# Patient Record
Sex: Female | Born: 1955 | Race: White | Hispanic: No | Smoking: Never smoker
Health system: Southern US, Community
[De-identification: ages and names within clinical notes are randomized; demographics above are authoritative.]

## PROBLEM LIST (undated history)

## (undated) DIAGNOSIS — E041 Nontoxic single thyroid nodule: Secondary | ICD-10-CM

## (undated) DIAGNOSIS — M1990 Unspecified osteoarthritis, unspecified site: Secondary | ICD-10-CM

## (undated) DIAGNOSIS — R222 Localized swelling, mass and lump, trunk: Secondary | ICD-10-CM

## (undated) DIAGNOSIS — T7840XA Allergy, unspecified, initial encounter: Secondary | ICD-10-CM

## (undated) DIAGNOSIS — L4050 Arthropathic psoriasis, unspecified: Secondary | ICD-10-CM

## (undated) DIAGNOSIS — J302 Other seasonal allergic rhinitis: Secondary | ICD-10-CM

## (undated) HISTORY — DX: Allergy, unspecified, initial encounter: T78.40XA

## (undated) HISTORY — PX: HIP ARTHROPLASTY: SHX981

## (undated) HISTORY — PX: REDUCTION MAMMAPLASTY: SUR839

## (undated) HISTORY — PX: EYE SURGERY: SHX253

---
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
## 1995-10-03 HISTORY — PX: CHOLECYSTECTOMY: SHX55

## 1995-10-03 HISTORY — PX: ABDOMINAL HYSTERECTOMY: SHX81

## 1998-01-29 ENCOUNTER — Other Ambulatory Visit: Admission: RE | Admit: 1998-01-29 | Discharge: 1998-01-29 | Payer: Self-pay | Admitting: Sports Medicine

## 1998-02-03 ENCOUNTER — Ambulatory Visit (HOSPITAL_COMMUNITY): Admission: RE | Admit: 1998-02-03 | Discharge: 1998-02-03 | Payer: Self-pay | Admitting: Sports Medicine

## 2000-04-25 ENCOUNTER — Other Ambulatory Visit: Admission: RE | Admit: 2000-04-25 | Discharge: 2000-04-25 | Payer: Self-pay | Admitting: Obstetrics and Gynecology

## 2002-08-21 ENCOUNTER — Other Ambulatory Visit: Admission: RE | Admit: 2002-08-21 | Discharge: 2002-08-21 | Payer: Self-pay | Admitting: Obstetrics & Gynecology

## 2002-08-25 ENCOUNTER — Encounter: Payer: Self-pay | Admitting: Obstetrics and Gynecology

## 2002-08-25 ENCOUNTER — Encounter: Admission: RE | Admit: 2002-08-25 | Discharge: 2002-08-25 | Payer: Self-pay | Admitting: Obstetrics and Gynecology

## 2003-10-03 HISTORY — PX: CERVICAL FUSION: SHX112

## 2004-06-09 ENCOUNTER — Ambulatory Visit (HOSPITAL_COMMUNITY): Admission: RE | Admit: 2004-06-09 | Discharge: 2004-06-10 | Payer: Self-pay | Admitting: Neurological Surgery

## 2004-07-11 ENCOUNTER — Encounter: Admission: RE | Admit: 2004-07-11 | Discharge: 2004-07-11 | Payer: Self-pay | Admitting: Neurosurgery

## 2004-07-18 ENCOUNTER — Other Ambulatory Visit: Admission: RE | Admit: 2004-07-18 | Discharge: 2004-07-18 | Payer: Self-pay | Admitting: Obstetrics and Gynecology

## 2012-03-02 HISTORY — PX: SHOULDER SURGERY: SHX246

## 2012-03-02 HISTORY — PX: SHOULDER ARTHROSCOPY W/ SUBACROMIAL DECOMPRESSION AND DISTAL CLAVICLE EXCISION: SHX2401

## 2012-07-22 ENCOUNTER — Other Ambulatory Visit: Payer: Self-pay | Admitting: Neurological Surgery

## 2012-07-24 ENCOUNTER — Encounter (HOSPITAL_COMMUNITY): Payer: Self-pay | Admitting: Pharmacy Technician

## 2012-08-02 ENCOUNTER — Encounter (HOSPITAL_COMMUNITY): Payer: Self-pay

## 2012-08-02 ENCOUNTER — Encounter (HOSPITAL_COMMUNITY)
Admission: RE | Admit: 2012-08-02 | Discharge: 2012-08-02 | Disposition: A | Discharge status: Home / Self Care | Payer: BC Managed Care – PPO | Source: Ambulatory Visit | Attending: Neurological Surgery | Admitting: Neurological Surgery

## 2012-08-02 HISTORY — DX: Unspecified osteoarthritis, unspecified site: M19.90

## 2012-08-02 LAB — CBC WITH DIFFERENTIAL/PLATELET
Basophils Absolute: 0.1 10*3/uL (ref 0.0–0.1)
Basophils Relative: 1 % (ref 0–1)
Hemoglobin: 13.7 g/dL (ref 12.0–15.0)
MCHC: 33.8 g/dL (ref 30.0–36.0)
Monocytes Relative: 7 % (ref 3–12)
Neutro Abs: 4.6 10*3/uL (ref 1.7–7.7)
Neutrophils Relative %: 57 % (ref 43–77)
RDW: 12.9 % (ref 11.5–15.5)
WBC: 8 10*3/uL (ref 4.0–10.5)

## 2012-08-02 LAB — PROTIME-INR: Prothrombin Time: 12 seconds (ref 11.6–15.2)

## 2012-08-02 LAB — BASIC METABOLIC PANEL
Chloride: 103 mEq/L (ref 96–112)
GFR calc Af Amer: >90 mL/min (ref >90)
Potassium: 4.3 mEq/L (ref 3.5–5.1)

## 2012-08-02 LAB — SURGICAL PCR SCREEN
MRSA, PCR: NEGATIVE
Staphylococcus aureus: POSITIVE — AB

## 2012-08-02 NOTE — Pre-Procedure Instructions (Signed)
20 Gabrielle Duncan  08/02/2012   Your procedure is scheduled on: Thursday, November 7th.  Report to [facility] at 9:15 AM.  Call this number if you have problems the morning of surgery: [phone]   Remember:   Do not eat food or drink any liquid:After Midnight.     Take these medicines the morning of surgery with A SIP OF WATER: Claritin if needed.   Do not wear jewelry, make-up or nail polish.  Do not wear lotions, powders, or perfumes. You may wear deodorant.  Do not shave 48 hours prior to surgery. Men may shave face and neck.  Do not bring valuables to the hospital.  Contacts, dentures or bridgework may not be worn into surgery.  Leave suitcase in the car. After surgery it may be brought to your room.  For patients admitted to the hospital, checkout time is 11:00 AM the day of discharge.   Patients discharged the day of surgery will not be allowed to drive home.  Name and phone number of your driver: NA  Special Instructions: Shower using CHG 2 nights before surgery and the night before surgery.  If you shower the day of surgery use CHG.  Use special wash - you have one bottle of CHG for all showers.  You should use approximately 1/3 of the bottle for each shower.   Please read over the following fact sheets that you were given: Pain Booklet, Coughing and Deep Breathing and Surgical Site Infection Prevention

## 2012-08-07 MED ORDER — CEFAZOLIN SODIUM-DEXTROSE 2-3 GM-% IV SOLR
2.0000 g | INTRAVENOUS | Status: AC
  Administered 2012-08-08: 2 g via INTRAVENOUS
  Filled 2012-08-07: qty 50

## 2012-08-08 ENCOUNTER — Encounter (HOSPITAL_COMMUNITY)
Admission: RE | Disposition: A | Discharge status: Home / Self Care | Payer: Self-pay | Source: Ambulatory Visit | Attending: Neurological Surgery

## 2012-08-08 ENCOUNTER — Encounter (HOSPITAL_COMMUNITY): Payer: Self-pay | Admitting: *Deleted

## 2012-08-08 ENCOUNTER — Ambulatory Visit (HOSPITAL_COMMUNITY): Payer: BC Managed Care – PPO | Admitting: *Deleted

## 2012-08-08 ENCOUNTER — Ambulatory Visit (HOSPITAL_COMMUNITY): Payer: BC Managed Care – PPO

## 2012-08-08 ENCOUNTER — Ambulatory Visit (HOSPITAL_COMMUNITY)
Admission: RE | Admit: 2012-08-08 | Discharge: 2012-08-09 | Disposition: A | Discharge status: Home / Self Care | Payer: BC Managed Care – PPO | Source: Ambulatory Visit | Attending: Neurological Surgery | Admitting: Neurological Surgery

## 2012-08-08 DIAGNOSIS — Z01818 Encounter for other preprocedural examination: Secondary | ICD-10-CM | POA: Insufficient documentation

## 2012-08-08 DIAGNOSIS — Z981 Arthrodesis status: Secondary | ICD-10-CM

## 2012-08-08 DIAGNOSIS — Z01810 Encounter for preprocedural cardiovascular examination: Secondary | ICD-10-CM | POA: Insufficient documentation

## 2012-08-08 DIAGNOSIS — M47812 Spondylosis without myelopathy or radiculopathy, cervical region: Secondary | ICD-10-CM | POA: Insufficient documentation

## 2012-08-08 DIAGNOSIS — Z01812 Encounter for preprocedural laboratory examination: Secondary | ICD-10-CM | POA: Insufficient documentation

## 2012-08-08 HISTORY — PX: ANTERIOR CERVICAL DECOMP/DISCECTOMY FUSION: SHX1161

## 2012-08-08 SURGERY — ANTERIOR CERVICAL DECOMPRESSION/DISCECTOMY FUSION 2 LEVEL/HARDWARE REMOVAL
Anesthesia: General

## 2012-08-08 MED ORDER — ONDANSETRON HCL 4 MG/2ML IJ SOLN: 4.0000 mg | Freq: Once | INTRAMUSCULAR | Status: DC | PRN

## 2012-08-08 MED ORDER — LACTATED RINGERS IV SOLN
INTRAVENOUS | Status: DC | PRN
  Administered 2012-08-08 (×2): via INTRAVENOUS

## 2012-08-08 MED ORDER — ARTIFICIAL TEARS OP OINT
TOPICAL_OINTMENT | OPHTHALMIC | Status: DC | PRN
  Administered 2012-08-08: 1 via OPHTHALMIC

## 2012-08-08 MED ORDER — OXYCODONE HCL 5 MG PO TABS: 5.0000 mg | ORAL_TABLET | Freq: Once | ORAL | Status: DC | PRN

## 2012-08-08 MED ORDER — SODIUM CHLORIDE 0.9 % IV SOLN
INTRAVENOUS | Status: AC
  Filled 2012-08-08: qty 500

## 2012-08-08 MED ORDER — GLYCOPYRROLATE 0.2 MG/ML IJ SOLN
INTRAMUSCULAR | Status: DC | PRN
  Administered 2012-08-08: .5 mg via INTRAVENOUS

## 2012-08-08 MED ORDER — SODIUM CHLORIDE 0.9 % IJ SOLN: 3.0000 mL | INTRAMUSCULAR | Status: DC | PRN

## 2012-08-08 MED ORDER — MEPERIDINE HCL 25 MG/ML IJ SOLN: 6.2500 mg | INTRAMUSCULAR | Status: DC | PRN

## 2012-08-08 MED ORDER — ACETAMINOPHEN 650 MG RE SUPP: 650.0000 mg | RECTAL | Status: DC | PRN

## 2012-08-08 MED ORDER — PHENYLEPHRINE HCL 10 MG/ML IJ SOLN
INTRAMUSCULAR | Status: DC | PRN
  Administered 2012-08-08: 80 ug via INTRAVENOUS

## 2012-08-08 MED ORDER — DEXAMETHASONE SODIUM PHOSPHATE 10 MG/ML IJ SOLN
10.0000 mg | INTRAMUSCULAR | Status: AC
  Administered 2012-08-08: 10 mg via INTRAVENOUS

## 2012-08-08 MED ORDER — DEXAMETHASONE SODIUM PHOSPHATE 10 MG/ML IJ SOLN
INTRAMUSCULAR | Status: AC
  Filled 2012-08-08: qty 1

## 2012-08-08 MED ORDER — ROCURONIUM BROMIDE 100 MG/10ML IV SOLN
INTRAVENOUS | Status: DC | PRN
  Administered 2012-08-08: 50 mg via INTRAVENOUS

## 2012-08-08 MED ORDER — POTASSIUM CHLORIDE IN NACL 20-0.9 MEQ/L-% IV SOLN
INTRAVENOUS | Status: DC
  Administered 2012-08-08: 17:00:00 via INTRAVENOUS
  Filled 2012-08-08 (×3): qty 1000

## 2012-08-08 MED ORDER — CYCLOBENZAPRINE HCL 10 MG PO TABS
10.0000 mg | ORAL_TABLET | Freq: Three times a day (TID) | ORAL | Status: DC | PRN
  Administered 2012-08-08 (×2): 10 mg via ORAL
  Filled 2012-08-08: qty 1

## 2012-08-08 MED ORDER — SENNA 8.6 MG PO TABS
1.0000 | ORAL_TABLET | Freq: Two times a day (BID) | ORAL | Status: DC
  Administered 2012-08-08: 8.6 mg via ORAL
  Filled 2012-08-08 (×3): qty 1

## 2012-08-08 MED ORDER — BACITRACIN 50000 UNITS IM SOLR
INTRAMUSCULAR | Status: AC
  Filled 2012-08-08: qty 1

## 2012-08-08 MED ORDER — SODIUM CHLORIDE 0.9 % IR SOLN
Status: DC | PRN
  Administered 2012-08-08: 12:00:00

## 2012-08-08 MED ORDER — LIDOCAINE HCL (CARDIAC) 20 MG/ML IV SOLN
INTRAVENOUS | Status: DC | PRN
  Administered 2012-08-08: 100 mg via INTRAVENOUS

## 2012-08-08 MED ORDER — CYCLOBENZAPRINE HCL 10 MG PO TABS
ORAL_TABLET | ORAL | Status: AC
  Filled 2012-08-08: qty 1

## 2012-08-08 MED ORDER — DEXAMETHASONE 4 MG PO TABS
4.0000 mg | ORAL_TABLET | Freq: Four times a day (QID) | ORAL | Status: DC
  Filled 2012-08-08 (×4): qty 1

## 2012-08-08 MED ORDER — PROPOFOL 10 MG/ML IV BOLUS
INTRAVENOUS | Status: DC | PRN
  Administered 2012-08-08: 120 mg via INTRAVENOUS

## 2012-08-08 MED ORDER — NEOSTIGMINE METHYLSULFATE 1 MG/ML IJ SOLN
INTRAMUSCULAR | Status: DC | PRN
  Administered 2012-08-08: 4 mg via INTRAVENOUS

## 2012-08-08 MED ORDER — THROMBIN 5000 UNITS EX SOLR
CUTANEOUS | Status: DC | PRN
  Administered 2012-08-08 (×3): 5000 [IU] via TOPICAL

## 2012-08-08 MED ORDER — HYDROCODONE-ACETAMINOPHEN 5-325 MG PO TABS
ORAL_TABLET | ORAL | Status: AC
  Filled 2012-08-08: qty 1

## 2012-08-08 MED ORDER — ONDANSETRON HCL 4 MG/2ML IJ SOLN: 4.0000 mg | INTRAMUSCULAR | Status: DC | PRN

## 2012-08-08 MED ORDER — HYDROCODONE-ACETAMINOPHEN 5-325 MG PO TABS
1.0000 | ORAL_TABLET | ORAL | Status: DC | PRN
  Administered 2012-08-08: 2 via ORAL
  Administered 2012-08-08: 1 via ORAL
  Administered 2012-08-09 (×2): 2 via ORAL
  Filled 2012-08-08 (×3): qty 2

## 2012-08-08 MED ORDER — SODIUM CHLORIDE 0.9 % IJ SOLN: 3.0000 mL | Freq: Two times a day (BID) | INTRAMUSCULAR | Status: DC

## 2012-08-08 MED ORDER — SUFENTANIL CITRATE 50 MCG/ML IV SOLN
INTRAVENOUS | Status: DC | PRN
  Administered 2012-08-08: 20 ug via INTRAVENOUS
  Administered 2012-08-08 (×3): 10 ug via INTRAVENOUS

## 2012-08-08 MED ORDER — HYDROMORPHONE HCL PF 1 MG/ML IJ SOLN
INTRAMUSCULAR | Status: AC
  Filled 2012-08-08: qty 1

## 2012-08-08 MED ORDER — DEXAMETHASONE SODIUM PHOSPHATE 4 MG/ML IJ SOLN
4.0000 mg | Freq: Four times a day (QID) | INTRAMUSCULAR | Status: DC
  Administered 2012-08-08 – 2012-08-09 (×3): 4 mg via INTRAVENOUS
  Filled 2012-08-08 (×7): qty 1

## 2012-08-08 MED ORDER — OXYCODONE HCL 5 MG/5ML PO SOLN: 5.0000 mg | Freq: Once | ORAL | Status: DC | PRN

## 2012-08-08 MED ORDER — HYDROMORPHONE HCL PF 1 MG/ML IJ SOLN
0.5000 mg | INTRAMUSCULAR | Status: DC | PRN
  Administered 2012-08-08: 1 mg via INTRAVENOUS
  Filled 2012-08-08: qty 1

## 2012-08-08 MED ORDER — ACETAMINOPHEN 10 MG/ML IV SOLN
1000.0000 mg | Freq: Four times a day (QID) | INTRAVENOUS | Status: DC
  Administered 2012-08-08 – 2012-08-09 (×3): 1000 mg via INTRAVENOUS
  Filled 2012-08-08 (×7): qty 100

## 2012-08-08 MED ORDER — PHENOL 1.4 % MT LIQD: 1.0000 | OROMUCOSAL | Status: DC | PRN

## 2012-08-08 MED ORDER — OXYCODONE HCL 5 MG PO TABS
ORAL_TABLET | ORAL | Status: AC
  Filled 2012-08-08: qty 1

## 2012-08-08 MED ORDER — 0.9 % SODIUM CHLORIDE (POUR BTL) OPTIME
TOPICAL | Status: DC | PRN
  Administered 2012-08-08: 1000 mL

## 2012-08-08 MED ORDER — ACETAMINOPHEN 325 MG PO TABS: 650.0000 mg | ORAL_TABLET | ORAL | Status: DC | PRN

## 2012-08-08 MED ORDER — HYDROMORPHONE HCL PF 1 MG/ML IJ SOLN: 0.2500 mg | INTRAMUSCULAR | Status: DC | PRN

## 2012-08-08 MED ORDER — BUPIVACAINE HCL (PF) 0.25 % IJ SOLN
INTRAMUSCULAR | Status: DC | PRN
  Administered 2012-08-08: 3 mL

## 2012-08-08 MED ORDER — MENTHOL 3 MG MT LOZG
1.0000 | LOZENGE | OROMUCOSAL | Status: DC | PRN
  Administered 2012-08-08: 3 mg via ORAL
  Filled 2012-08-08: qty 9

## 2012-08-08 MED ORDER — HEMOSTATIC AGENTS (NO CHARGE) OPTIME
TOPICAL | Status: DC | PRN
  Administered 2012-08-08: 1 via TOPICAL

## 2012-08-08 MED ORDER — CEFAZOLIN SODIUM 1-5 GM-% IV SOLN
1.0000 g | Freq: Three times a day (TID) | INTRAVENOUS | Status: AC
  Administered 2012-08-08 (×2): 1 g via INTRAVENOUS
  Filled 2012-08-08 (×2): qty 50

## 2012-08-08 MED ORDER — ACETAMINOPHEN 10 MG/ML IV SOLN
INTRAVENOUS | Status: AC
  Administered 2012-08-08: 1000 mg via INTRAVENOUS
  Filled 2012-08-08: qty 100

## 2012-08-08 MED ORDER — MIDAZOLAM HCL 5 MG/5ML IJ SOLN
INTRAMUSCULAR | Status: DC | PRN
  Administered 2012-08-08: 2 mg via INTRAVENOUS

## 2012-08-08 SURGICAL SUPPLY — 58 items
APL SKNCLS STERI-STRIP NONHPOA (GAUZE/BANDAGES/DRESSINGS) ×1
BAG DECANTER FOR FLEXI CONT (MISCELLANEOUS) ×2 IMPLANT
BENZOIN TINCTURE PRP APPL 2/3 (GAUZE/BANDAGES/DRESSINGS) ×2 IMPLANT
BONE CERV LORDOTIC 14.5X12X7 (Bone Implant) ×4 IMPLANT
BONE CERV LORDOTIC 14.5X12X8 (Bone Implant) IMPLANT
BUR MATCHSTICK NEURO 3.0 LAGG (BURR) ×2 IMPLANT
CANISTER SUCTION 2500CC (MISCELLANEOUS) ×2 IMPLANT
CLOTH BEACON ORANGE TIMEOUT ST (SAFETY) ×2 IMPLANT
CONT SPEC 4OZ CLIKSEAL STRL BL (MISCELLANEOUS) ×2 IMPLANT
DRAIN SNY WOU 7FLT (WOUND CARE) ×1 IMPLANT
DRAPE C-ARM 42X72 X-RAY (DRAPES) ×4 IMPLANT
DRAPE LAPAROTOMY 100X72 PEDS (DRAPES) ×2 IMPLANT
DRAPE MICROSCOPE LEICA (MISCELLANEOUS) ×1 IMPLANT
DRAPE POUCH INSTRU U-SHP 10X18 (DRAPES) ×2 IMPLANT
DRESSING TELFA 8X3 (GAUZE/BANDAGES/DRESSINGS) ×2 IMPLANT
DRILL BIT (BIT) ×1 IMPLANT
DRSG OPSITE 4X5.5 SM (GAUZE/BANDAGES/DRESSINGS) ×2 IMPLANT
DURAPREP 6ML APPLICATOR 50/CS (WOUND CARE) ×2 IMPLANT
ELECT COATED BLADE 2.86 ST (ELECTRODE) ×2 IMPLANT
ELECT REM PT RETURN 9FT ADLT (ELECTROSURGICAL) ×2
ELECTRODE REM PT RTRN 9FT ADLT (ELECTROSURGICAL) ×1 IMPLANT
EVACUATOR SILICONE 100CC (DRAIN) ×1 IMPLANT
GAUZE SPONGE 4X4 16PLY XRAY LF (GAUZE/BANDAGES/DRESSINGS) IMPLANT
GLOVE BIO SURGEON STRL SZ8 (GLOVE) ×4 IMPLANT
GLOVE BIOGEL PI IND STRL 6.5 (GLOVE) IMPLANT
GLOVE BIOGEL PI IND STRL 7.0 (GLOVE) IMPLANT
GLOVE BIOGEL PI INDICATOR 6.5 (GLOVE) ×2
GLOVE BIOGEL PI INDICATOR 7.0 (GLOVE) ×1
GOWN BRE IMP SLV AUR LG STRL (GOWN DISPOSABLE) ×1 IMPLANT
GOWN BRE IMP SLV AUR XL STRL (GOWN DISPOSABLE) ×3 IMPLANT
GOWN STRL REIN 2XL LVL4 (GOWN DISPOSABLE) ×2 IMPLANT
GRAFT BNE SPCR VG2 14.5X12X7 (Bone Implant) IMPLANT
GRAFT BNE SPCR VG2 14.5X12X8 (Bone Implant) IMPLANT
HEAD HALTER (SOFTGOODS) IMPLANT
HEMOSTAT POWDER KIT SURGIFOAM (HEMOSTASIS) ×2 IMPLANT
KIT BASIN OR (CUSTOM PROCEDURE TRAY) ×2 IMPLANT
KIT ROOM TURNOVER OR (KITS) ×2 IMPLANT
NDL HYPO 25X1 1.5 SAFETY (NEEDLE) ×1 IMPLANT
NDL SPNL 20GX3.5 QUINCKE YW (NEEDLE) ×1 IMPLANT
NEEDLE HYPO 25X1 1.5 SAFETY (NEEDLE) ×4 IMPLANT
NEEDLE SPNL 20GX3.5 QUINCKE YW (NEEDLE) ×2 IMPLANT
NS IRRIG 1000ML POUR BTL (IV SOLUTION) ×2 IMPLANT
PACK LAMINECTOMY NEURO (CUSTOM PROCEDURE TRAY) ×2 IMPLANT
PAD ARMBOARD 7.5X6 YLW CONV (MISCELLANEOUS) ×2 IMPLANT
PLATE 2 37.5XLCK NS SPNE CVD (Plate) IMPLANT
PLATE 2 ATLANTIS TRANS (Plate) ×2 IMPLANT
RUBBERBAND STERILE (MISCELLANEOUS) ×4 IMPLANT
SCREW ST 12X4XST FXANG NS (Screw) IMPLANT
SCREW ST FIX 4 ATL (Screw) ×8 IMPLANT
SPONGE GAUZE 4X4 12PLY (GAUZE/BANDAGES/DRESSINGS) ×1 IMPLANT
SPONGE INTESTINAL PEANUT (DISPOSABLE) ×2 IMPLANT
STRIP CLOSURE SKIN 1/2X4 (GAUZE/BANDAGES/DRESSINGS) ×2 IMPLANT
SUT VIC AB 3-0 SH 8-18 (SUTURE) ×2 IMPLANT
SYR 20ML ECCENTRIC (SYRINGE) IMPLANT
TOWEL OR 17X24 6PK STRL BLUE (TOWEL DISPOSABLE) ×2 IMPLANT
TOWEL OR 17X26 10 PK STRL BLUE (TOWEL DISPOSABLE) ×2 IMPLANT
TRAP SPECIMEN MUCOUS 40CC (MISCELLANEOUS) ×2 IMPLANT
WATER STERILE IRR 1000ML POUR (IV SOLUTION) ×2 IMPLANT

## 2012-08-08 NOTE — Anesthesia Preprocedure Evaluation (Addendum)
Anesthesia Evaluation  Patient identified by MRN, date of birth, ID band Patient awake    Reviewed: Allergy & Precautions, H&P , NPO status , Patient's Chart, lab work & pertinent test results, reviewed documented beta blocker date and time   Airway Mallampati: I TM Distance: >3 FB Neck ROM: Full    Dental  (+) Teeth Intact, Dental Advisory Given and Caps   Pulmonary          Cardiovascular     Neuro/Psych    GI/Hepatic   Endo/Other    Renal/GU      Musculoskeletal   Abdominal   Peds  Hematology   Anesthesia Other Findings   Reproductive/Obstetrics                          Anesthesia Physical Anesthesia Plan  ASA: II  Anesthesia Plan: General   Post-op Pain Management:    Induction: Intravenous  Airway Management Planned: Oral ETT  Additional Equipment:   Intra-op Plan:   Post-operative Plan: Extubation in OR  Informed Consent: I have reviewed the patients History and Physical, chart, labs and discussed the procedure including the risks, benefits and alternatives for the proposed anesthesia with the patient or authorized representative who has indicated his/her understanding and acceptance.   Dental advisory given  Plan Discussed with: CRNA, Surgeon and Anesthesiologist  Anesthesia Plan Comments:        Anesthesia Quick Evaluation

## 2012-08-08 NOTE — Plan of Care (Signed)
Problem: Consults Goal: Diagnosis - Spinal Surgery Outcome: Completed/Met Date Met:  08/08/12 Cervical Spine Fusion

## 2012-08-08 NOTE — Transfer of Care (Signed)
Immediate Anesthesia Transfer of Care Note  Patient: Gabrielle Duncan  Procedure(s) Performed: Procedure(s) (LRB) with comments: ANTERIOR CERVICAL DECOMPRESSION/DISCECTOMY FUSION 2 LEVEL/HARDWARE REMOVAL (N/A) - anterior cervical five-six to six seven decompression fusion with removal plate four-five  Patient Location: PACU  Anesthesia Type:General  Level of Consciousness: sedated  Airway & Oxygen Therapy: Patient Spontanous Breathing and Patient connected to face mask oxygen  Post-op Assessment: Report given to PACU RN and Post -op Vital signs reviewed and stable  Post vital signs: Reviewed and stable  Complications: No apparent anesthesia complications

## 2012-08-08 NOTE — Op Note (Signed)
08/08/2012  2:57 PM  PATIENT:  Gabrielle Duncan  56 y.o. female  PRE-OPERATIVE DIAGNOSIS:  Cervical spondylosis C5-6 C6-7 adjacent to previous fusion C4-5, neck and arm pain  POST-OPERATIVE DIAGNOSIS:  Same  PROCEDURE:  1. Decompressive anterior cervical discectomy C5-6 C6-7, 2. Anterior cervical arthrodesis C5-6 C6-7 utilizing cortico-cancellus allografts 3.  removal of C4-5 plate and placement of Atlantis translational plate [id number]   SURGEON:  Marikay Alar, MD  ASSISTANTS: None   ANESTHESIA:   General  EBL:  100  ml  Total I/O In: 1500 [I.V.:1500] Out: 100 [Blood:100]  BLOOD ADMINISTERED:none  DRAINS: None    SPECIMEN:  No Specimen  INDICATION FOR PROCEDURE:  this patient had undergone a previous C4-5 ACDF. She presented with a long history of neck and arm pain. MRI showed significant spondylosis at C5-6 and C6-7. Flexion extension film showed instability at C5-6. I recommended ACDF with plating at C5-6 and C6-7.  Patient understood the risks, benefits, and alternatives and potential outcomes and wished to proceed.  PROCEDURE DETAILS: Patient was brought to the operating room placed under general endotracheal anesthesia. Patient was placed in the supine position on the operating room table. The neck was prepped with Duraprep and draped in a sterile fashion.   Three cc of local anesthesia was injected and a transverse incision was made on the right side of the neck.  Dissection was carried down thru the subcutaneous tissue and the platysma was  elevated, opened, and undermined with Metzenbaum scissors.  Dissection was then carried out thru an avascular plane leaving the sternocleidomastoid carotid artery and jugular vein laterally and the trachea and esophagus medially. The old plate was identified and the soft tissues were removed from the surface of the plate. The screws were removed and the plate was then removed. The old holes were waxed to control bleeding. The ventral aspect of the  vertebral column was identified and a localizing x-ray was taken. The C5-6 level was identified. The longus colli muscles were then elevated and the retractor was placed. The annulus was incised and the disc space entered at each level. Discectomy was performed with micro-curettes and pituitary rongeurs. I then used the high-speed drill to drill the endplates down to the level of the posterior longitudinal ligament at each level. The operating microscope was draped and brought into the field provided additional magnification, illumination and visualization. Discectomy was continued posteriorly thru the disc space. Posterior longitudinal ligament was opened with a nerve hook, and then removed along with disc herniation and osteophytes, decompressing the spinal canal and thecal sac. We then continued to remove osteophytic overgrowth and disc material decompressing the neural foramina and exiting nerve roots bilaterally. The scope was angled up and down to help decompress and undercut the vertebral bodies. Once the decompression was completed we could pass a nerve hook circumferentially to assure adequate decompression in the midline and in the neural foramina. So by both visualization and palpation we felt we had an adequate decompression of the neural elements. We then measured the height of the intravertebral disc space and selected a 6 millimeter allograft. It was then gently positioned in the intravertebral disc space at each level and countersunk. I then used a Atlantis translational plate and placed fixed angle screws into the vertebral bodies and locked them into position. The wound was irrigated with bacitracin solution, checked for hemostasis which was established and confirmed. Once meticulous hemostasis was achieved, we then proceeded with closure. The platysma was closed with interrupted 3-0  undyed Vicryl suture, the subcuticular layer was closed with interrupted 3-0 undyed Vicryl suture. The skin edges  were approximated with steristrips. The drapes were removed. A sterile dressing was applied. The patient was then awakened from general anesthesia and transferred to the recovery room in stable condition. At the end of the procedure all sponge, needle and instrument counts were correct.   PLAN OF CARE: Admit for overnight observation  PATIENT DISPOSITION:  PACU - hemodynamically stable.   Delay start of Pharmacological VTE agent (>24hrs) due to surgical blood loss or risk of bleeding:  yes

## 2012-08-08 NOTE — Anesthesia Postprocedure Evaluation (Signed)
Anesthesia Post Note  Patient: Gabrielle Duncan  Procedure(s) Performed: Procedure(s) (LRB): ANTERIOR CERVICAL DECOMPRESSION/DISCECTOMY FUSION 2 LEVEL/HARDWARE REMOVAL (N/A)  Anesthesia type: general  Patient location: PACU  Post pain: Pain level controlled  Post assessment: Patient's Cardiovascular Status Stable  Last Vitals:  Filed Vitals:   08/08/12 1541  BP:   Pulse:   Temp: 36.1 C  Resp:     Post vital signs: Reviewed and stable  Level of consciousness: sedated  Complications: No apparent anesthesia complications

## 2012-08-08 NOTE — Preoperative (Signed)
Beta Blockers   Reason not to administer Beta Blockers:Not Applicable 

## 2012-08-08 NOTE — H&P (Signed)
Subjective:   Patient is a 56 y.o. female admitted for ACDF C5-6 C6-7 for cervical spondylosis. The patient first presented to me with complaints of neck pain. Onset of symptoms was several years ago. The pain is described as aching and occurs all day. The pain is rated severe, and is located at the base of the neck and radiates to the arms. The symptoms have been progressive. Symptoms are exacerbated by extending head backwards, and are relieved by rest.  Previous work up includes MRI of cervical spine, results: Cervical spondylosis with degenerative disc disease. She's had a previous fusion at C4-5. Past Medical History  Diagnosis Date  . Arthritis     Past Surgical History  Procedure Date  . Abdominal hysterectomy 1997  . Cholecystectomy   . Shoulder surgery     debridement and partial clavical removal  . Cervical fusion 2005  . Eye surgery     for dry eye syndrome    No Known Allergies  History  Substance Use Topics  . Smoking status: Never Smoker   . Smokeless tobacco: Not on file  . Alcohol Use: No    History reviewed. No pertinent family history. Prior to Admission medications   Medication Sig Start Date End Date Taking? Authorizing Provider  Ibuprofen (ADVIL PO) Take 2 capsules by mouth daily as needed. For bone pain   Yes Historical Provider, MD  loratadine (CLARITIN) 10 MG tablet Take 10 mg by mouth daily as needed. For seasonal allergies   Yes Historical Provider, MD     Review of Systems  Positive ROS: neg  All other systems have been reviewed and were otherwise negative with the exception of those mentioned in the HPI and as above.  Objective: Vital signs in last 24 hours: Temp:  [97.9 F (36.6 C)] 97.9 F (36.6 C) (11/07 0923) Pulse Rate:  [83] 83  (11/07 0923) Resp:  [18] 18  (11/07 0923) BP: (118)/(79) 118/79 mmHg (11/07 0923) SpO2:  [99 %] 99 % (11/07 0923)  General Appearance: Alert, cooperative, no distress, appears stated age Head: Normocephalic,  without obvious abnormality, atraumatic Eyes: PERRL, conjunctiva/corneas clear, EOM's intact, fundi benign, both eyes      Ears: Normal TM's and external ear canals, both ears Throat: Lips, mucosa, and tongue normal; teeth and gums normal Neck: Supple, symmetrical, trachea midline, no adenopathy; thyroid: No enlargement/tenderness/nodules; no carotid bruit or JVD Back: Symmetric, no curvature, ROM normal, no CVA tenderness Lungs: Clear to auscultation bilaterally, respirations unlabored Heart: Regular rate and rhythm, S1 and S2 normal, no murmur, rub or gallop Abdomen: Soft, non-tender, bowel sounds active all four quadrants, no masses, no organomegaly Extremities: Extremities normal, atraumatic, no cyanosis or edema Pulses: 2+ and symmetric all extremities Skin: Skin color, texture, turgor normal, no rashes or lesions  NEUROLOGIC:  Mental status: Alert and oriented x4, no aphasia, good attention span, fund of knowledge and memory  Motor Exam - grossly normal Sensory Exam - grossly normal Reflexes: 1+ Coordination - grossly normal Gait - grossly normal Balance - grossly normal Cranial Nerves: I: smell Not tested  II: visual acuity  OS: nl    OD: nl  II: visual fields Full to confrontation  II: pupils Equal, round, reactive to light  III,VII: ptosis None  III,IV,VI: extraocular muscles  Full ROM  V: mastication Normal  V: facial light touch sensation  Normal  V,VII: corneal reflex  Present  VII: facial muscle function - upper  Normal  VII: facial muscle function - lower Normal  VIII: hearing Not tested  IX: soft palate elevation  Normal  IX,X: gag reflex Present  XI: trapezius strength  5/5  XI: sternocleidomastoid strength 5/5  XI: neck flexion strength  5/5  XII: tongue strength  Normal    Data Review Lab Results  Component Value Date   WBC 8.0 08/02/2012   HGB 13.7 08/02/2012   HCT 40.5 08/02/2012   MCV 89.6 08/02/2012   PLT 295 08/02/2012   Lab Results  Component  Value Date   NA 137 08/02/2012   K 4.3 08/02/2012   CL 103 08/02/2012   CO2 24 08/02/2012   BUN 11 08/02/2012   CREATININE 0.76 08/02/2012   GLUCOSE 99 08/02/2012   Lab Results  Component Value Date   INR 0.89 08/02/2012    Assessment:   Cervical neck pain with herniated nucleus pulposus/ spondylosis/ stenosis at C5-6 C6-7 adjacent to her previous fusion at C4-5. Patient has failed conservative therapy. Planned surgery : ACDF C5-6 C6-7  Plan:   I explained the condition and procedure to the patient and answered any questions.  Patient wishes to proceed with procedure as planned. Understands risks/ benefits/ and expected or typical outcomes.  Jessina Marse S 08/08/2012 12:10 PM

## 2012-08-09 ENCOUNTER — Encounter (HOSPITAL_COMMUNITY): Payer: Self-pay | Admitting: Neurological Surgery

## 2012-08-09 NOTE — Discharge Summary (Signed)
Physician Discharge Summary  Patient ID: Gabrielle Duncan MRN: [id number] DOB/AGE: January 28, 1956 56 y.o.  Admit date: 08/08/2012 Discharge date: 08/09/2012  Admission Diagnoses: cervical spondylosis     Discharge Diagnoses: seame   Discharged Condition: good  Hospital Course: The patient was admitted on 08/08/2012 and taken to the operating room where the patient underwent ACDF. The patient tolerated the procedure well and was taken to the recovery room and then to the floor in stable condition. The hospital course was routine. There were no complications. The wound remained clean dry and intact. Pt had appropriate neck and throat soreness. No complaints of arm pain or new N/T/W. The patient remained afebrile with stable vital signs, and tolerated a regular diet. The patient continued to increase activities, and pain was well controlled with oral pain medications.   Consults: None  Significant Diagnostic Studies:  Results for orders placed during the hospital encounter of 08/02/12  SURGICAL PCR SCREEN      Component Value Range   MRSA, PCR NEGATIVE  NEGATIVE   Staphylococcus aureus POSITIVE (*) NEGATIVE  CBC WITH DIFFERENTIAL      Component Value Range   WBC 8.0  4.0 - 10.5 K/uL   RBC 4.52  3.87 - 5.11 MIL/uL   Hemoglobin 13.7  12.0 - 15.0 g/dL   HCT 40.9  81.1 - 91.4 %   MCV 89.6  78.0 - 100.0 fL   MCH 30.3  26.0 - 34.0 pg   MCHC 33.8  30.0 - 36.0 g/dL   RDW 78.2  95.6 - 21.3 %   Platelets 295  150 - 400 K/uL   Neutrophils Relative 57  43 - 77 %   Neutro Abs 4.6  1.7 - 7.7 K/uL   Lymphocytes Relative 30  12 - 46 %   Lymphs Abs 2.4  0.7 - 4.0 K/uL   Monocytes Relative 7  3 - 12 %   Monocytes Absolute 0.6  0.1 - 1.0 K/uL   Eosinophils Relative 5  0 - 5 %   Eosinophils Absolute 0.4  0.0 - 0.7 K/uL   Basophils Relative 1  0 - 1 %   Basophils Absolute 0.1  0.0 - 0.1 K/uL  BASIC METABOLIC PANEL      Component Value Range   Sodium 137  135 - 145 mEq/L   Potassium 4.3  3.5 - 5.1  mEq/L   Chloride 103  96 - 112 mEq/L   CO2 24  19 - 32 mEq/L   Glucose, Bld 99  70 - 99 mg/dL   BUN 11  6 - 23 mg/dL   Creatinine, Ser 0.86  0.50 - 1.10 mg/dL   Calcium 9.2  8.4 - 57.8 mg/dL   GFR calc non Af Amer >90  >90 mL/min   GFR calc Af Amer >90  >90 mL/min  PROTIME-INR      Component Value Range   Prothrombin Time 12.0  11.6 - 15.2 seconds   INR 0.89  0.00 - 1.49    Dg Chest 2 View  08/02/2012  *RADIOLOGY REPORT*  Clinical Data: History of anterior cervical fusion.  CHEST - 2 VIEW  Comparison: 04/15/2010  Findings: Two views of the chest demonstrate a surgical plate in the lower cervical spine.  Lungs are clear bilaterally. Heart and mediastinum are within normal limits.  Bony thorax appears intact. Trachea is midline.  IMPRESSION: No acute cardiopulmonary disease.   Original Report Authenticated By: Richarda Overlie, M.D.    Dg Cervical Spine 1 View  08/08/2012  *RADIOLOGY REPORT*  Clinical Data: Cervical disc disease.  DG CERVICAL SPINE - 1 VIEW  Comparison: Radiograph dated 07/11/2004  Findings: Single lateral C-arm image demonstrates the patient there is now undergone anterior cervical fusion at C5-6 and C6-7.  The plate and screws and plug at C4-5 have been removed.  That fusion is solid.  IMPRESSION: Anterior cervical fusions performed at C5-6 and C6-7.   Original Report Authenticated By: Francene Boyers, M.D.    Dg C-arm 1-60 Min  08/08/2012  C-arm imaging provided for anterior cervical fusion.   Original Report Authenticated By: Francene Boyers, M.D.     Antibiotics:  Anti-infectives     Start     Dose/Rate Route Frequency Ordered Stop   08/08/12 1600   ceFAZolin (ANCEF) IVPB 1 g/50 mL premix        1 g 100 mL/hr over 30 Minutes Intravenous Every 8 hours 08/08/12 1550 08/09/12 0006   08/08/12 1210   bacitracin 50,000 Units in sodium chloride irrigation 0.9 % 500 mL irrigation  Status:  Discontinued          As needed 08/08/12 1328 08/08/12 1502   08/08/12 1204   bacitracin  16109 UNITS injection     Comments: DAYE, DEVONIA: cabinet override         08/08/12 1204 08/09/12 0014   08/08/12 0600   ceFAZolin (ANCEF) IVPB 2 g/50 mL premix        2 g 100 mL/hr over 30 Minutes Intravenous On call to O.R. 08/07/12 1423 08/08/12 1235          Discharge Exam: Blood pressure 97/66, pulse 75, temperature 97.9 F (36.6 C), temperature source Oral, resp. rate 14, SpO2 93.00%. Neurologic: Grossly normal Incision CDI  Discharge Medications:     Medication List     As of 08/09/2012  8:15 AM    STOP taking these medications         ADVIL PO      TAKE these medications         loratadine 10 MG tablet   Commonly known as: CLARITIN   Take 10 mg by mouth daily as needed. For seasonal allergies        Disposition: home  Final Dx: ACDF C5-6 C6-7      Discharge Orders    Future Orders Please Complete By Expires   Diet - low sodium heart healthy      Increase activity slowly      Discharge instructions      Comments:   No heavy lifting, strenuous house work, or driving for 1 week   Call MD for:  temperature >100.4      Call MD for:  persistant nausea and vomiting      Call MD for:  severe uncontrolled pain      Call MD for:  redness, tenderness, or signs of infection (pain, swelling, redness, odor or green/yellow discharge around incision site)      Call MD for:  difficulty breathing, headache or visual disturbances      Call MD for:  persistant dizziness or light-headedness         Follow-up Information    Follow up with Rikki Smestad S, MD. Schedule an appointment as soon as possible for a visit in 2 weeks.   Contact information:   [address] [place] [address] [phone]           Signed: Tia Alert 08/09/2012, 8:15 AM

## 2012-09-17 ENCOUNTER — Encounter (HOSPITAL_COMMUNITY): Payer: Self-pay | Admitting: Pharmacy Technician

## 2012-09-17 NOTE — Progress Notes (Signed)
Chest x ray, EKG   11/13 EPIC,  Left voice mail with Cordelia Pen at Dr Darla Lesches office regarding no orders for PST visit at [employer]

## 2012-09-17 NOTE — Patient Instructions (Addendum)
20 [name]  09/17/2012   Your procedure is scheduled on:  09/24/12  TUESDAY  Report to [facility] at   0515    AM.  Call this number if you have problems the morning of surgery: [phone]       Remember:   Do not eat food  Or drink :After Midnight.  Monday NIGHT   Take these medicines the morning of surgery with A SIP OF WATER:  NONE   .  Contacts, dentures or partial plates can not be worn to surgery  Leave suitcase in the car. After surgery it may be brought to your room.  For patients admitted to the hospital, checkout time is 11:00 AM day of  discharge.             SPECIAL INSTRUCTIONS- SEE [place] PREPARING FOR SURGERY INSTRUCTION SHEET-     DO NOT WEAR JEWELRY, LOTIONS, POWDERS, OR PERFUMES.  WOMEN-- DO NOT SHAVE LEGS OR UNDERARMS FOR 12 HOURS BEFORE SHOWERS. MEN MAY SHAVE FACE.  Patients discharged the day of surgery will not be allowed to drive home. IF going home the day of surgery, you must have a driver and someone to stay with you for the first 24 hours  Name and phone number of your driver: inpatient                                                                       Please read over the following fact sheets that you were given: MRSA Information, Incentive Spirometry Sheet, Blood Transfusion Sheet  Information                                                                                   [name]  PST 336  [phone]                 FAILURE TO FOLLOW THESE INSTRUCTIONS MAY RESULT IN  CANCELLATION   OF YOUR SURGERY                                                  Patient Signature _____________________________

## 2012-09-18 ENCOUNTER — Other Ambulatory Visit: Payer: Self-pay | Admitting: Orthopedic Surgery

## 2012-09-18 ENCOUNTER — Encounter (HOSPITAL_COMMUNITY)
Admission: RE | Admit: 2012-09-18 | Discharge: 2012-09-18 | Disposition: A | Discharge status: Home / Self Care | Payer: BC Managed Care – PPO | Source: Ambulatory Visit | Attending: Orthopedic Surgery | Admitting: Orthopedic Surgery

## 2012-09-18 ENCOUNTER — Encounter (HOSPITAL_COMMUNITY): Payer: Self-pay

## 2012-09-18 LAB — CBC
HCT: 39.5 % (ref 36.0–46.0)
Hemoglobin: 13.5 g/dL (ref 12.0–15.0)
MCH: 30.5 pg (ref 26.0–34.0)
MCHC: 34.2 g/dL (ref 30.0–36.0)

## 2012-09-18 LAB — PROTIME-INR: INR: 0.82 (ref 0.00–1.49)

## 2012-09-18 NOTE — Progress Notes (Signed)
PST VISIT-   0815-  No orders in epic for pre op labs-  Labs done per anesthesia protocol

## 2012-09-24 ENCOUNTER — Encounter (HOSPITAL_COMMUNITY)
Admission: RE | Disposition: A | Discharge status: Home / Self Care | Payer: Self-pay | Source: Ambulatory Visit | Attending: Orthopedic Surgery

## 2012-09-24 ENCOUNTER — Encounter (HOSPITAL_COMMUNITY): Payer: Self-pay | Admitting: Orthopedic Surgery

## 2012-09-24 ENCOUNTER — Inpatient Hospital Stay (HOSPITAL_COMMUNITY)
Admission: RE | Admit: 2012-09-24 | Discharge: 2012-09-27 | DRG: 491 | Disposition: A | Discharge status: Home / Self Care | Payer: BC Managed Care – PPO | Source: Ambulatory Visit | Attending: Orthopedic Surgery | Admitting: Orthopedic Surgery

## 2012-09-24 ENCOUNTER — Ambulatory Visit (HOSPITAL_COMMUNITY): Payer: BC Managed Care – PPO

## 2012-09-24 ENCOUNTER — Encounter (HOSPITAL_COMMUNITY): Payer: Self-pay | Admitting: *Deleted

## 2012-09-24 ENCOUNTER — Encounter (HOSPITAL_COMMUNITY): Payer: Self-pay | Admitting: Anesthesiology

## 2012-09-24 ENCOUNTER — Ambulatory Visit (HOSPITAL_COMMUNITY): Payer: BC Managed Care – PPO | Admitting: Anesthesiology

## 2012-09-24 DIAGNOSIS — M19011 Primary osteoarthritis, right shoulder: Secondary | ICD-10-CM

## 2012-09-24 DIAGNOSIS — Z79899 Other long term (current) drug therapy: Secondary | ICD-10-CM

## 2012-09-24 DIAGNOSIS — L259 Unspecified contact dermatitis, unspecified cause: Secondary | ICD-10-CM | POA: Diagnosis present

## 2012-09-24 DIAGNOSIS — Z981 Arthrodesis status: Secondary | ICD-10-CM

## 2012-09-24 DIAGNOSIS — Z01812 Encounter for preprocedural laboratory examination: Secondary | ICD-10-CM

## 2012-09-24 DIAGNOSIS — M19019 Primary osteoarthritis, unspecified shoulder: Principal | ICD-10-CM | POA: Diagnosis present

## 2012-09-24 HISTORY — PX: TOTAL SHOULDER ARTHROPLASTY: SHX126

## 2012-09-24 LAB — BASIC METABOLIC PANEL
BUN: 20 mg/dL (ref 6–23)
Chloride: 101 mEq/L (ref 96–112)
GFR calc Af Amer: 78 mL/min — ABNORMAL LOW (ref >90)
Potassium: 4.2 mEq/L (ref 3.5–5.1)

## 2012-09-24 LAB — URINALYSIS, ROUTINE W REFLEX MICROSCOPIC
Nitrite: NEGATIVE
Protein, ur: NEGATIVE mg/dL
Urobilinogen, UA: 0.2 mg/dL (ref 0.0–1.0)
pH: 5 (ref 5.0–8.0)

## 2012-09-24 SURGERY — ARTHROPLASTY, SHOULDER, TOTAL
Anesthesia: General | Site: Shoulder | Laterality: Right | Wound class: Clean

## 2012-09-24 MED ORDER — OXYCODONE-ACETAMINOPHEN 10-325 MG PO TABS: 1.0000 | ORAL_TABLET | Freq: Four times a day (QID) | ORAL | Status: DC | PRN

## 2012-09-24 MED ORDER — LACTATED RINGERS IV SOLN
INTRAVENOUS | Status: DC
  Administered 2012-09-24: 11:00:00 via INTRAVENOUS

## 2012-09-24 MED ORDER — ROPIVACAINE HCL 5 MG/ML IJ SOLN
INTRAMUSCULAR | Status: AC
  Filled 2012-09-24: qty 30

## 2012-09-24 MED ORDER — METHOCARBAMOL 100 MG/ML IJ SOLN
500.0000 mg | Freq: Four times a day (QID) | INTRAVENOUS | Status: DC | PRN
  Administered 2012-09-24 – 2012-09-25 (×2): 500 mg via INTRAVENOUS
  Filled 2012-09-24 (×2): qty 5

## 2012-09-24 MED ORDER — METHOCARBAMOL 500 MG PO TABS
500.0000 mg | ORAL_TABLET | Freq: Four times a day (QID) | ORAL | Status: DC | PRN
  Administered 2012-09-25 – 2012-09-26 (×3): 500 mg via ORAL
  Filled 2012-09-24 (×3): qty 1

## 2012-09-24 MED ORDER — PHENYLEPHRINE HCL 10 MG/ML IJ SOLN
10.0000 mg | INTRAVENOUS | Status: DC | PRN
  Administered 2012-09-24: 10 ug/min via INTRAVENOUS

## 2012-09-24 MED ORDER — ZOLPIDEM TARTRATE 5 MG PO TABS: 5.0000 mg | ORAL_TABLET | Freq: Every evening | ORAL | Status: DC | PRN

## 2012-09-24 MED ORDER — PHENOL 1.4 % MT LIQD
1.0000 | OROMUCOSAL | Status: DC | PRN
  Filled 2012-09-24: qty 177

## 2012-09-24 MED ORDER — ONDANSETRON HCL 4 MG/2ML IJ SOLN
4.0000 mg | Freq: Four times a day (QID) | INTRAMUSCULAR | Status: DC | PRN
  Administered 2012-09-25: 4 mg via INTRAVENOUS
  Filled 2012-09-24: qty 2

## 2012-09-24 MED ORDER — HYDROMORPHONE HCL PF 1 MG/ML IJ SOLN
0.5000 mg | INTRAMUSCULAR | Status: DC | PRN
  Administered 2012-09-24: 0.5 mg via INTRAVENOUS
  Administered 2012-09-25 (×3): 1 mg via INTRAVENOUS
  Administered 2012-09-25: 0.5 mg via INTRAVENOUS
  Filled 2012-09-24 (×5): qty 1

## 2012-09-24 MED ORDER — ACETAMINOPHEN 10 MG/ML IV SOLN
INTRAVENOUS | Status: DC | PRN
  Administered 2012-09-24: 1000 mg via INTRAVENOUS

## 2012-09-24 MED ORDER — GLYCOPYRROLATE 0.2 MG/ML IJ SOLN
INTRAMUSCULAR | Status: DC | PRN
  Administered 2012-09-24: 0.4 mg via INTRAVENOUS

## 2012-09-24 MED ORDER — SUCCINYLCHOLINE CHLORIDE 20 MG/ML IJ SOLN
INTRAMUSCULAR | Status: DC | PRN
  Administered 2012-09-24: 100 mg via INTRAVENOUS

## 2012-09-24 MED ORDER — ACETAMINOPHEN 10 MG/ML IV SOLN
INTRAVENOUS | Status: AC
  Filled 2012-09-24: qty 100

## 2012-09-24 MED ORDER — SCOPOLAMINE 1 MG/3DAYS TD PT72
MEDICATED_PATCH | TRANSDERMAL | Status: AC
  Filled 2012-09-24: qty 1

## 2012-09-24 MED ORDER — ALUM & MAG HYDROXIDE-SIMETH 200-200-20 MG/5ML PO SUSP: 30.0000 mL | ORAL | Status: DC | PRN

## 2012-09-24 MED ORDER — METOCLOPRAMIDE HCL 5 MG/ML IJ SOLN
5.0000 mg | Freq: Three times a day (TID) | INTRAMUSCULAR | Status: DC | PRN
Start: 2012-09-24 — End: 2012-09-27
  Administered 2012-09-25: 10 mg via INTRAVENOUS
  Filled 2012-09-24: qty 2

## 2012-09-24 MED ORDER — ONDANSETRON HCL 4 MG PO TABS
4.0000 mg | ORAL_TABLET | Freq: Four times a day (QID) | ORAL | Status: DC | PRN
  Administered 2012-09-25: 4 mg via ORAL
  Filled 2012-09-24: qty 1

## 2012-09-24 MED ORDER — PROMETHAZINE HCL 25 MG PO TABS: 25.0000 mg | ORAL_TABLET | Freq: Four times a day (QID) | ORAL | Status: DC | PRN

## 2012-09-24 MED ORDER — LIDOCAINE HCL (CARDIAC) 20 MG/ML IV SOLN
INTRAVENOUS | Status: DC | PRN
  Administered 2012-09-24: 60 mg via INTRAVENOUS

## 2012-09-24 MED ORDER — FENTANYL CITRATE 0.05 MG/ML IJ SOLN
INTRAMUSCULAR | Status: DC | PRN
  Administered 2012-09-24 (×2): 50 ug via INTRAVENOUS

## 2012-09-24 MED ORDER — 0.9 % SODIUM CHLORIDE (POUR BTL) OPTIME
TOPICAL | Status: DC | PRN
  Administered 2012-09-24: 1000 mL

## 2012-09-24 MED ORDER — HYDROMORPHONE HCL PF 1 MG/ML IJ SOLN
INTRAMUSCULAR | Status: DC | PRN
  Administered 2012-09-24: 0.5 mg via INTRAVENOUS

## 2012-09-24 MED ORDER — ACETAMINOPHEN 325 MG PO TABS: 650.0000 mg | ORAL_TABLET | Freq: Four times a day (QID) | ORAL | Status: DC | PRN

## 2012-09-24 MED ORDER — SORBITOL 70 % SOLN
30.0000 mL | Freq: Every day | Status: DC | PRN
  Filled 2012-09-24: qty 30

## 2012-09-24 MED ORDER — OXYCODONE HCL 5 MG PO TABS
5.0000 mg | ORAL_TABLET | ORAL | Status: DC | PRN
  Administered 2012-09-25: 10 mg via ORAL
  Administered 2012-09-25: 5 mg via ORAL
  Administered 2012-09-25: 10 mg via ORAL
  Administered 2012-09-25: 5 mg via ORAL
  Administered 2012-09-25 – 2012-09-27 (×10): 10 mg via ORAL
  Filled 2012-09-24 (×2): qty 2
  Filled 2012-09-24: qty 1
  Filled 2012-09-24 (×3): qty 2
  Filled 2012-09-24: qty 1
  Filled 2012-09-24 (×2): qty 2
  Filled 2012-09-24: qty 1
  Filled 2012-09-24 (×5): qty 2

## 2012-09-24 MED ORDER — ONDANSETRON HCL 4 MG/2ML IJ SOLN
INTRAMUSCULAR | Status: DC | PRN
  Administered 2012-09-24: 4 mg via INTRAVENOUS

## 2012-09-24 MED ORDER — NEOSTIGMINE METHYLSULFATE 1 MG/ML IJ SOLN
INTRAMUSCULAR | Status: DC | PRN
  Administered 2012-09-24: 2 mg via INTRAVENOUS

## 2012-09-24 MED ORDER — LORATADINE 10 MG PO TABS
10.0000 mg | ORAL_TABLET | Freq: Every day | ORAL | Status: DC
  Administered 2012-09-24 – 2012-09-27 (×4): 10 mg via ORAL
  Filled 2012-09-24 (×4): qty 1

## 2012-09-24 MED ORDER — ACETAMINOPHEN 650 MG RE SUPP: 650.0000 mg | Freq: Four times a day (QID) | RECTAL | Status: DC | PRN

## 2012-09-24 MED ORDER — MEPERIDINE HCL 50 MG/ML IJ SOLN: 6.2500 mg | INTRAMUSCULAR | Status: DC | PRN

## 2012-09-24 MED ORDER — OXYCODONE-ACETAMINOPHEN 5-325 MG PO TABS
1.0000 | ORAL_TABLET | ORAL | Status: DC | PRN
  Administered 2012-09-24: 2 via ORAL
  Administered 2012-09-24 (×2): 1 via ORAL
  Administered 2012-09-25: 2 via ORAL
  Filled 2012-09-24 (×2): qty 1
  Filled 2012-09-24 (×2): qty 2

## 2012-09-24 MED ORDER — PROPOFOL 10 MG/ML IV BOLUS: INTRAVENOUS | Status: DC | PRN

## 2012-09-24 MED ORDER — SENNA 8.6 MG PO TABS
1.0000 | ORAL_TABLET | Freq: Two times a day (BID) | ORAL | Status: DC
  Administered 2012-09-24 – 2012-09-27 (×7): 8.6 mg via ORAL
  Filled 2012-09-24 (×7): qty 1

## 2012-09-24 MED ORDER — METHOCARBAMOL 500 MG PO TABS: 500.0000 mg | ORAL_TABLET | Freq: Four times a day (QID) | ORAL | Status: DC

## 2012-09-24 MED ORDER — ROCURONIUM BROMIDE 100 MG/10ML IV SOLN
INTRAVENOUS | Status: DC | PRN
  Administered 2012-09-24: 10 mg via INTRAVENOUS
  Administered 2012-09-24: 30 mg via INTRAVENOUS

## 2012-09-24 MED ORDER — MIDAZOLAM HCL 5 MG/5ML IJ SOLN
INTRAMUSCULAR | Status: DC | PRN
  Administered 2012-09-24: 2 mg via INTRAVENOUS

## 2012-09-24 MED ORDER — SODIUM CHLORIDE 0.9 % IR SOLN
Status: DC | PRN
  Administered 2012-09-24: 3000 mL

## 2012-09-24 MED ORDER — PROMETHAZINE HCL 25 MG/ML IJ SOLN
INTRAMUSCULAR | Status: AC
  Filled 2012-09-24: qty 1

## 2012-09-24 MED ORDER — CEFAZOLIN SODIUM-DEXTROSE 2-3 GM-% IV SOLR
2.0000 g | Freq: Four times a day (QID) | INTRAVENOUS | Status: AC
  Administered 2012-09-24 – 2012-09-25 (×3): 2 g via INTRAVENOUS
  Filled 2012-09-24 (×3): qty 50

## 2012-09-24 MED ORDER — CEFAZOLIN SODIUM-DEXTROSE 2-3 GM-% IV SOLR
2.0000 g | INTRAVENOUS | Status: AC
  Administered 2012-09-24: 2 g via INTRAVENOUS

## 2012-09-24 MED ORDER — CEFAZOLIN SODIUM-DEXTROSE 2-3 GM-% IV SOLR
INTRAVENOUS | Status: AC
  Filled 2012-09-24: qty 50

## 2012-09-24 MED ORDER — POLYETHYLENE GLYCOL 3350 17 G PO PACK: 17.0000 g | PACK | Freq: Every day | ORAL | Status: DC | PRN

## 2012-09-24 MED ORDER — MENTHOL 3 MG MT LOZG
1.0000 | LOZENGE | OROMUCOSAL | Status: DC | PRN
  Administered 2012-09-24 – 2012-09-26 (×2): 3 mg via ORAL
  Filled 2012-09-24 (×2): qty 9

## 2012-09-24 MED ORDER — POTASSIUM CHLORIDE IN NACL 20-0.45 MEQ/L-% IV SOLN
INTRAVENOUS | Status: DC
  Administered 2012-09-24 – 2012-09-25 (×3): via INTRAVENOUS
  Filled 2012-09-24 (×5): qty 1000

## 2012-09-24 MED ORDER — LACTATED RINGERS IV SOLN
INTRAVENOUS | Status: DC | PRN
  Administered 2012-09-24 (×2): via INTRAVENOUS

## 2012-09-24 MED ORDER — DOCUSATE SODIUM 100 MG PO CAPS
100.0000 mg | ORAL_CAPSULE | Freq: Two times a day (BID) | ORAL | Status: DC
  Administered 2012-09-24 – 2012-09-27 (×7): 100 mg via ORAL

## 2012-09-24 MED ORDER — SENNA-DOCUSATE SODIUM 8.6-50 MG PO TABS: 1.0000 | ORAL_TABLET | Freq: Every day | ORAL | Status: DC

## 2012-09-24 MED ORDER — METOCLOPRAMIDE HCL 10 MG PO TABS: 5.0000 mg | ORAL_TABLET | Freq: Three times a day (TID) | ORAL | Status: DC | PRN

## 2012-09-24 MED ORDER — PROMETHAZINE HCL 25 MG/ML IJ SOLN
6.2500 mg | INTRAMUSCULAR | Status: DC | PRN
  Administered 2012-09-24: 6.25 mg via INTRAVENOUS

## 2012-09-24 MED ORDER — DIPHENHYDRAMINE HCL 12.5 MG/5ML PO ELIX
12.5000 mg | ORAL_SOLUTION | ORAL | Status: DC | PRN
Start: 2012-09-24 — End: 2012-09-27
  Administered 2012-09-26 – 2012-09-27 (×2): 12.5 mg via ORAL
  Filled 2012-09-24 (×2): qty 5

## 2012-09-24 MED ORDER — PROPOFOL 10 MG/ML IV BOLUS
INTRAVENOUS | Status: DC | PRN
  Administered 2012-09-24: 140 mg via INTRAVENOUS

## 2012-09-24 MED ORDER — HYDROMORPHONE HCL PF 1 MG/ML IJ SOLN: 0.2500 mg | INTRAMUSCULAR | Status: DC | PRN

## 2012-09-24 SURGICAL SUPPLY — 55 items
APL SKNCLS STERI-STRIP NONHPOA (GAUZE/BANDAGES/DRESSINGS) ×1
BAG SPEC THK2 15X12 ZIP CLS (MISCELLANEOUS) ×1
BAG ZIPLOCK 12X15 (MISCELLANEOUS) ×2 IMPLANT
BENZOIN TINCTURE PRP APPL 2/3 (GAUZE/BANDAGES/DRESSINGS) ×2 IMPLANT
BIT DRILL QUICK RELEASE PRPHRL (DRILL) IMPLANT
BLADE SAW SGTL 18X1.27X75 (BLADE) ×2 IMPLANT
CEMENT BONE DEPUY (Cement) ×3 IMPLANT
CLOTH BEACON ORANGE TIMEOUT ST (SAFETY) ×2 IMPLANT
CLSR STERI-STRIP ANTIMIC 1/2X4 (GAUZE/BANDAGES/DRESSINGS) ×3 IMPLANT
COVER MAYO STAND STRL (DRAPES) ×2 IMPLANT
DECANTER SPIKE VIAL GLASS SM (MISCELLANEOUS) ×2 IMPLANT
DRAPE TABLE BACK 44X90 PK DISP (DRAPES) ×2 IMPLANT
DRAPE U-SHAPE 47X51 STRL (DRAPES) ×2 IMPLANT
DRILL QUICK RELEASE PERIPHERAL (DRILL) ×6
DRSG EMULSION OIL 3X16 NADH (GAUZE/BANDAGES/DRESSINGS) ×2 IMPLANT
DRSG MEPILEX BORDER 4X8 (GAUZE/BANDAGES/DRESSINGS) ×2 IMPLANT
DURAPREP 26ML APPLICATOR (WOUND CARE) ×2 IMPLANT
ELECT BLADE 6.5 EXT (BLADE) ×2 IMPLANT
ELECT BLADE TIP CTD 4 INCH (ELECTRODE) ×2 IMPLANT
ELECT REM PT RETURN 9FT ADLT (ELECTROSURGICAL) ×2
ELECTRODE REM PT RTRN 9FT ADLT (ELECTROSURGICAL) ×1 IMPLANT
FACESHIELD LNG OPTICON STERILE (SAFETY) ×4 IMPLANT
GLOVE BIOGEL PI IND STRL 8 (GLOVE) ×1 IMPLANT
GLOVE BIOGEL PI INDICATOR 8 (GLOVE) ×1
GLOVE ORTHOPEDIC STR SZ6.5 (GLOVE) ×1 IMPLANT
GLOVE SURG ORTHO 7.0 STRL STRW (GLOVE) ×1 IMPLANT
GOWN STRL NON-REIN LRG LVL3 (GOWN DISPOSABLE) ×2 IMPLANT
HANDPIECE INTERPULSE COAX TIP (DISPOSABLE) ×2
HEMOSTAT SURGICEL 4X8 (HEMOSTASIS) ×2 IMPLANT
HOOD SURGICAL BLUE (PROTECTIVE WEAR) ×5 IMPLANT
KIT BASIN OR (CUSTOM PROCEDURE TRAY) ×2 IMPLANT
NEEDLE MAYO .5 CIRCLE (NEEDLE) ×2 IMPLANT
PACK SHOULDER CUSTOM OPM052 (CUSTOM PROCEDURE TRAY) ×2 IMPLANT
PASSER SUT SWANSON 36MM LOOP (INSTRUMENTS) ×2 IMPLANT
PIN STEINMANN THREADED TIP (PIN) ×1 IMPLANT
PIN THREADED REVERSE (PIN) ×1 IMPLANT
POSITIONER SURGICAL ARM (MISCELLANEOUS) ×2 IMPLANT
SET HNDPC FAN SPRY TIP SCT (DISPOSABLE) ×1 IMPLANT
SLING ARM IMMOBILIZER LRG (SOFTGOODS) ×2 IMPLANT
SLING ARM IMMOBILIZER MED (SOFTGOODS) ×1 IMPLANT
SMARTMIX MINI TOWER (MISCELLANEOUS) ×2
SPONGE SURGIFOAM ABS GEL 100 (HEMOSTASIS) ×2 IMPLANT
SUCTION FRAZIER 12FR DISP (SUCTIONS) ×2 IMPLANT
SUT ETHIBOND NAB CT1 #1 30IN (SUTURE) ×8 IMPLANT
SUT FIBERWIRE #2 38 T-5 BLUE (SUTURE) ×10
SUT MNCRL AB 4-0 PS2 18 (SUTURE) ×2 IMPLANT
SUT VIC AB 1 CT1 36 (SUTURE) ×6 IMPLANT
SUT VIC AB 2-0 CT1 27 (SUTURE) ×4
SUT VIC AB 2-0 CT1 TAPERPNT 27 (SUTURE) ×2 IMPLANT
SUT VIC AB 3-0 SH 18 (SUTURE) ×4 IMPLANT
SUTURE FIBERWR #2 38 T-5 BLUE (SUTURE) IMPLANT
TOWEL OR 17X26 10 PK STRL BLUE (TOWEL DISPOSABLE) ×6 IMPLANT
TOWER SMARTMIX MINI (MISCELLANEOUS) ×1 IMPLANT
TRAY FOLEY CATH 14FRSI W/METER (CATHETERS) ×1 IMPLANT
WATER STERILE IRR 1500ML POUR (IV SOLUTION) ×1 IMPLANT

## 2012-09-24 NOTE — Anesthesia Postprocedure Evaluation (Signed)
  Anesthesia Post-op Note  Patient: Gabrielle Duncan [facility]  Procedure(s) Performed: Procedure(s) (LRB): TOTAL SHOULDER ARTHROPLASTY (Right)  Patient Location: PACU  Anesthesia Type: GA combined with regional for post-op pain  Level of Consciousness: awake and alert   Airway and Oxygen Therapy: Patient Spontanous Breathing  Post-op Pain: mild  Post-op Assessment: Post-op Vital signs reviewed, Patient's Cardiovascular Status Stable, Respiratory Function Stable, Patent Airway and No signs of Nausea or vomiting  Last Vitals:  Filed Vitals:   09/24/12 1130  BP: 91/60  Pulse: 53  Temp:   Resp: 12    Post-op Vital Signs: stable   Complications: No apparent anesthesia complications

## 2012-09-24 NOTE — Anesthesia Preprocedure Evaluation (Addendum)
Anesthesia Evaluation  Patient identified by MRN, date of birth, ID band Patient awake    Reviewed: Allergy & Precautions, H&P , NPO status , Patient's Chart, lab work & pertinent test results  Airway Mallampati: I TM Distance: >3 FB Neck ROM: Full    Dental No notable dental hx. (+) Dental Advisory Given   Pulmonary neg pulmonary ROS,  breath sounds clear to auscultation  Pulmonary exam normal       Cardiovascular negative cardio ROS  Rhythm:Regular Rate:Normal     Neuro/Psych negative neurological ROS  negative psych ROS   GI/Hepatic negative GI ROS, Neg liver ROS,   Endo/Other  negative endocrine ROS  Renal/GU negative Renal ROS  negative genitourinary   Musculoskeletal negative musculoskeletal ROS (+)   Abdominal   Peds negative pediatric ROS (+)  Hematology negative hematology ROS (+)   Anesthesia Other Findings Upper front right implant  Reproductive/Obstetrics negative OB ROS                           Anesthesia Physical Anesthesia Plan  ASA: II  Anesthesia Plan: General   Post-op Pain Management:    Induction: Intravenous  Airway Management Planned: Oral ETT  Additional Equipment:   Intra-op Plan:   Post-operative Plan: Extubation in OR  Informed Consent: I have reviewed the patients History and Physical, chart, labs and discussed the procedure including the risks, benefits and alternatives for the proposed anesthesia with the patient or authorized representative who has indicated his/her understanding and acceptance.   Dental advisory given  Plan Discussed with: CRNA  Anesthesia Plan Comments: (scb)        Anesthesia Quick Evaluation

## 2012-09-24 NOTE — Transfer of Care (Signed)
Immediate Anesthesia Transfer of Care Note  Patient: Gabrielle Duncan [facility] [place]  Procedure(s) Performed: Procedure(s) (LRB) with comments: TOTAL SHOULDER ARTHROPLASTY (Right)  Patient Location: PACU  Anesthesia Type:General  Level of Consciousness: awake, alert , oriented, patient cooperative and responds to stimulation  Airway & Oxygen Therapy: Patient Spontanous Breathing and Patient connected to face mask oxygen  Post-op Assessment: Report given to PACU RN, Post -op Vital signs reviewed and stable and Patient moving all extremities X 4  Post vital signs: Reviewed and stable  Complications: No apparent anesthesia complications

## 2012-09-24 NOTE — Preoperative (Signed)
Beta Blockers   Reason not to administer Beta Blockers:Not Applicable 

## 2012-09-24 NOTE — H&P (Signed)
  PREOPERATIVE H&P  Chief Complaint: DJD RIGHT SHOULDER   HPI: Gabrielle Duncan is a 56 y.o. female who presents for preoperative history and physical with a diagnosis of DJD RIGHT SHOULDER . Symptoms are rated as moderate to severe, and have been worsening.  This is significantly impairing activities of daily living.  She has elected for surgical management. She failed injections, pt, nsaids, pain med, and arthroscopic debridement.  Past Medical History  Diagnosis Date  . Arthritis   . Eczema    Past Surgical History  Procedure Date  . Abdominal hysterectomy 1997  . Cholecystectomy   . Shoulder surgery     debridement and partial clavical removal  . Cervical fusion 2005  . Eye surgery     for dry eye syndrome  . Anterior cervical decomp/discectomy fusion 08/08/2012    Procedure: ANTERIOR CERVICAL DECOMPRESSION/DISCECTOMY FUSION 2 LEVEL/HARDWARE REMOVAL;  Surgeon: Tia Alert, MD;  Location: [facility];  Service: Neurosurgery;  Laterality: N/A;  anterior cervical five-six to six seven decompression fusion with removal plate four-five   History   Social History  . Marital Status: Single    Spouse Name: N/A    Number of Children: N/A  . Years of Education: N/A   Social History Main Topics  . Smoking status: Never Smoker   . Smokeless tobacco: Never Used  . Alcohol Use: No  . Drug Use: No  . Sexually Active:    Other Topics Concern  . None   Social History Narrative  . None   History reviewed. No pertinent family history. No Known Allergies Prior to Admission medications   Medication Sig Start Date End Date Taking? Authorizing Provider  acetaminophen (TYLENOL) 500 MG tablet Take 1,000 mg by mouth every 6 (six) hours as needed. Pain    Historical Provider, MD  Cinnamon 500 MG capsule Take 500 mg by mouth daily.    Historical Provider, MD  ibuprofen (ADVIL,MOTRIN) 200 MG tablet Take 400 mg by mouth every 6 (six) hours as needed. Pain    Historical Provider, MD   loratadine (CLARITIN) 10 MG tablet Take 10 mg by mouth daily.    Historical Provider, MD     Positive ROS: All other systems have been reviewed and were otherwise negative with the exception of those mentioned in the HPI and as above.  Physical Exam: General: Alert, no acute distress Cardiovascular: No pedal edema Respiratory: No cyanosis, no use of accessory musculature GI: No organomegaly, abdomen is soft and non-tender Skin: No lesions in the area of chief complaint Neurologic: Sensation intact distally Psychiatric: Patient is competent for consent with normal mood and affect Lymphatic: No axillary or cervical lymphadenopathy  MUSCULOSKELETAL: right shoulder AROM 0-110, positive crepitance, positive weakness secondary to pain.  Assessment: DJD RIGHT SHOULDER   Plan: Plan for Procedure(s): TOTAL SHOULDER ARTHROPLASTY  The risks benefits and alternatives were discussed with the patient including but not limited to the risks of nonoperative treatment, versus surgical intervention including infection, bleeding, nerve injury,  blood clots, cardiopulmonary complications, morbidity, mortality, among others, and they were willing to proceed.   Paton Crum P, MD Cell [phone] Pager [phone]  09/24/2012 7:11 AM

## 2012-09-24 NOTE — Op Note (Signed)
09/24/2012  10:04 AM  PATIENT:  Gabrielle Duncan    PRE-OPERATIVE DIAGNOSIS:  DJD RIGHT SHOULDER   POST-OPERATIVE DIAGNOSIS:  Same  PROCEDURE:  TOTAL SHOULDER ARTHROPLASTY  SURGEON:  Eulas Post, MD  PHYSICIAN ASSISTANT: Janace Litten, OPA-C, present and scrubbed throughout the case, critical for completion in a timely fashion, and for retraction, instrumentation, and closure.  ANESTHESIA:   General  PREOPERATIVE INDICATIONS:  Gabrielle Duncan is a  56 y.o. female with a diagnosis of DJD RIGHT SHOULDER  who failed conservative measures and elected for surgical management.    The risks benefits and alternatives were discussed with the patient preoperatively including but not limited to the risks of infection, bleeding, nerve injury, cardiopulmonary complications, the need for revision surgery, dislocation, loosening, incomplete relief of pain, among others, and the patient was willing to proceed.   OPERATIVE IMPLANTS: Biomet size 9 mini press-fit humeral stem, size 46+18 Versa-dial humeral head, set in the E position with increased coverage posteriorly and superiorly, with a small cemented glenoid polyethylene 3 peg implant with a central regenerex noncemented post.   OPERATIVE FINDINGS: Advanced glenohumeral osteoarthritis involving the glenoid and the humeral head with substantial osteophyte formation inferiorly.   OPERATIVE PROCEDURE: The patient is brought to the operating room and placed in the supine position. General anesthesia was administered. IV antibiotics were given.  A foley was placed. The upper extremity was prepped and draped in usual sterile fashion. The patient was in a beachchair position with all bony prominences padded.   Time out was performed and a deltopectoral approach was carried out. The biceps tendon was tenodesed to the pectoralis tendon. The subscapularis was released, tagging it with a #2 FiberWire, leaving a cuff of tendon for repair.   The inferior  osteophyte was removed, and release of the capsule off of the humeral side was completed. The head was dislocated, and I reamed sequentially. I placed the humeral cutting guide at 30 of retroversion, and then pinned this into place, and made my humeral neck cut. This is at the appropriate level.   I then placed deep retractors and exposed the glenoid. I excised the labrum circumferentially, taking care to protect the axillary nerve inferiorly.   I then placed a guidewire into the center position, controlling appropriate version and inclination. I then reamed over the guidewire with the small reamer, and was satisfied with the preparation. I preserved the subchondral bone in order to maximize the strength and minimize the risk for subsequent subsidence.   I then drilled the central hole for the regenerate peg, and then placed the guide, and then drilled the 3 peripheral peg holes. I had excellent bony circumferential contact.   I then cleaned the glenoid, irrigated it copiously, and then dried it and cemented the prosthesis into place. Excellent seating was achieved. I had full exposure. The cement cured, and then I turned my attention to the humeral side.   I sequentially broached, up to the selected size, with the broach set at 30 of retroversion. I then placed the real stem. I trialed with multiple heads, and the above-named component was selected. Increased posterior coverage improved the coverage. The soft tissue tension was appropriate.   I then impacted the real humeral head into place, reduced the head, and irrigated copiously. Excellent stability and range of motion was achieved. I repaired the subscapularis with 4 #2 FiberWire, as well as the rotator interval, and irrigated copiously once more. The subcutaneous tissue was closed with Vicryl including  the deltopectoral fascia.   The skin was closed with Steri-Strips and sterile gauze was applied. She had a preoperative nerve block. She  tolerated the procedure well and there were no complications.

## 2012-09-25 LAB — TYPE AND SCREEN: Antibody Screen: NEGATIVE

## 2012-09-25 LAB — CBC
HCT: 33.3 % — ABNORMAL LOW (ref 36.0–46.0)
MCHC: 33.6 g/dL (ref 30.0–36.0)
Platelets: 256 10*3/uL (ref 150–400)
RDW: 12.7 % (ref 11.5–15.5)

## 2012-09-25 LAB — BASIC METABOLIC PANEL
BUN: 9 mg/dL (ref 6–23)
Creatinine, Ser: 0.65 mg/dL (ref 0.50–1.10)
GFR calc Af Amer: >90 mL/min (ref >90)
GFR calc non Af Amer: >90 mL/min (ref >90)
Potassium: 3.8 mEq/L (ref 3.5–5.1)

## 2012-09-25 LAB — ABO/RH: ABO/RH(D): O POS

## 2012-09-25 NOTE — Progress Notes (Signed)
Occupational Therapy Evaluation Patient Details Name: Gabrielle Duncan MRN: [id number] DOB: 03/20/1956 Today's Date: 09/25/2012 Time: 1025-1040 OT Time Calculation (min): 15 min  OT Assessment / Plan / Recommendation Clinical Impression  56 yo s/p TSA secondary to OA. PTA, pt independent with ADL, mobility and worked full time. Pt will benefit from skilled OT services for TSA education and HEP, mobility and ADL retraining. Will follow acutely.. Pt to follow up with Dr. Dion Saucier and begin outpt therapy when appropriate. Pt very painful. Pain meds requested. Will return to complete session.    OT Assessment  Patient needs continued OT Services    Follow Up Recommendations  Outpatient OT;Other (comment) (TBD by Dr. Dion Saucier)    Barriers to Discharge None    Equipment Recommendations  Tub/shower seat    Recommendations for Other Services  none  Frequency  Min 3X/week    Precautions / Restrictions Precautions Precautions: Shoulder Type of Shoulder Precautions: no  Precaution Booklet Issued:  (please give handout to pt Thursday) Precaution Comments: no shoulder ROM. elbow, wrist and hand ROM only Required Braces or Orthoses: Other Brace/Splint (sling - at all times with the exception of exercise and ADL) Restrictions RUE Weight Bearing: Non weight bearing   Pertinent Vitals/Pain 8. Pain meds requested and given    ADL  Eating/Feeding: Set up Where Assessed - Eating/Feeding: Bed level Grooming: Minimal assistance Where Assessed - Grooming: Unsupported sitting Upper Body Bathing: Moderate assistance Where Assessed - Upper Body Bathing: Unsupported sitting Lower Body Bathing: Minimal assistance Where Assessed - Lower Body Bathing: Supported sit to stand Upper Body Dressing: Maximal assistance Where Assessed - Upper Body Dressing: Unsupported sitting Lower Body Dressing: Set up Where Assessed - Lower Body Dressing: Supported sit to stand Toilet Transfer: Modified  independent Statistician Method: Sit to Barista: Comfort height toilet Toileting - Clothing Manipulation and Hygiene: Minimal assistance Where Assessed - Engineer, mining and Hygiene: Sit to stand from 3-in-1 or toilet;Standing Transfers/Ambulation Related to ADLs: mod I ADL Comments: limitations due to precautions and pain    OT Diagnosis: Generalized weakness;Acute pain  OT Problem List: Decreased strength;Decreased range of motion;Decreased activity tolerance;Decreased knowledge of use of DME or AE;Decreased knowledge of precautions;Impaired UE functional use;Pain OT Treatment Interventions: Self-care/ADL training;Therapeutic exercise;Energy conservation;DME and/or AE instruction;Therapeutic activities;Patient/family education   OT Goals Acute Rehab OT Goals OT Goal Formulation: With patient Time For Goal Achievement: 10/09/12 Potential to Achieve Goals: Good ADL Goals Pt Will Perform Upper Body Bathing: with set-up;with supervision;Sitting, edge of bed;Unsupported ADL Goal: Upper Body Bathing - Progress: Goal set today Pt Will Perform Upper Body Dressing: with set-up;with supervision;Sitting, bed;Unsupported ADL Goal: Upper Body Dressing - Progress: Goal set today Additional ADL Goal #1: Pt will donn/doff sling with S and verbalize appropriate sling wearing schedule ADL Goal: Additional Goal #1 - Progress: Goal set today Arm Goals Pt Will Perform AROM: Independently;Right upper extremity;10 reps;Other (comment) (elbow, wrist and hand only) Arm Goal: AROM - Progress: Goal set today Additional Arm Goal #1: Pt will verbalize appropriate positioning of RUE at rest with support behind shoulder. Arm Goal: Additional Goal #1 - Progress: Goal set today  Visit Information  Last OT Received On: 09/25/12 Assistance Needed: +1    Subjective Data  Subjective: I am in pain   Prior Functioning     Home Living Lives With: Significant  other Available Help at Discharge: Available 24 hours/day Type of Home: House Home Access: Stairs to enter [employer] of Steps: 1 Entrance  Stairs-Rails: None Home Layout: One level Bathroom Shower/Tub: Health visitor: Standard Bathroom Accessibility: Yes How Accessible: Accessible via walker Home Adaptive Equipment: Straight cane Prior Function Level of Independence: Independent Able to Take Stairs?: Yes Driving: Yes 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 Communication: No difficulties Dominant Hand: Right         Vision/Perception Perception Perception: Within Functional Limits Praxis Praxis: Intact   Cognition  Overall Cognitive Status: Appears within functional limits for tasks assessed/performed Arousal/Alertness: Awake/alert Orientation Level: Appears intact for tasks assessed Behavior During Session: [facility] for tasks performed    Extremity/Trunk Assessment Left Upper Extremity Assessment LUE ROM/Strength/Tone: Deficits LUE ROM/Strength/Tone Deficits: limited shoulder ROM  to @ 45 FF due to OA LUE Sensation: WFL - Light Touch;WFL - Proprioception LUE Coordination: Deficits (due to lack of shoulder ROM) Right Lower Extremity Assessment RLE ROM/Strength/Tone: Deficits RLE ROM/Strength/Tone Deficits: pain and decreased ROM but functional due to OA RLE Sensation: WFL - Light Touch;WFL - Proprioception RLE Coordination: WFL - gross/fine motor Left Lower Extremity Assessment LLE ROM/Strength/Tone: Deficits LLE ROM/Strength/Tone Deficits: as above LLE Sensation: WFL - Light Touch;WFL - Proprioception LLE Coordination: WFL - gross/fine motor Trunk Assessment Trunk Assessment: Normal (cervical fusion 08-2012)     Mobility Bed Mobility Bed Mobility: Rolling Right;Right Sidelying to Sit;Sitting - Scoot to [employer] of Bed;Supine to Sit Rolling Right: 5: Supervision Details for Bed Mobility Assistance: cues  for NWB Transfers Transfers: Sit to Stand;Stand to Sit Sit to Stand: 6: Modified independent (Device/Increase time)     Shoulder Instructions  Reviewed Dr. Shelba Flake shoulder protocal   Exercise     Balance  WFL   End of Session OT - End of Session Equipment Utilized During Treatment: Other (comment) (sling) Activity Tolerance: Patient tolerated treatment well Patient left: in bed Nurse Communication: Patient requests pain meds;Other (comment);Precautions;Weight bearing status;Mobility status (need for pain and nausea meds)  GO     Loman Logan,HILLARY 09/25/2012, 11:43 AM Luisa Dago, OTR/L  [phone] 09/25/2012

## 2012-09-25 NOTE — Progress Notes (Signed)
Patient ID: Gabrielle Duncan, female   DOB: 07-13-1956, 56 y.o.   MRN: [id number]     Subjective:  Patient reports pain as mild to moderate.  States that she has the pain better controlled for now   Objective:   VITALS:   Filed Vitals:   09/24/12 2200 09/25/12 0210 09/25/12 0300 09/25/12 0602  BP: 115/70 93/67  94/61  Pulse: 67 63  75  Temp: 98.2 F (36.8 C) 99 F (37.2 C)  98.3 F (36.8 C)  TempSrc: Oral Oral  Oral  Resp: 18 18  16   Height:   5\' 6"  (1.676 m)   Weight:   69.8 kg (153 lb 14.1 oz)   SpO2: 96% 97%  98%    ABD soft Sensation intact distally Dorsiflexion/Plantar flexion intact Incision: dressing C/D/I and no drainage  LABS  Results for orders placed during the hospital encounter of 09/24/12 (from the past 24 hour(s))  CBC     Status: Abnormal   Collection Time   09/25/12  4:33 AM      Component Value Range   WBC 9.5  4.0 - 10.5 K/uL   RBC 3.70 (*) 3.87 - 5.11 MIL/uL   Hemoglobin 11.2 (*) 12.0 - 15.0 g/dL   HCT 40.9 (*) 81.1 - 91.4 %   MCV 90.0  78.0 - 100.0 fL   MCH 30.3  26.0 - 34.0 pg   MCHC 33.6  30.0 - 36.0 g/dL   RDW 78.2  95.6 - 21.3 %   Platelets 256  150 - 400 K/uL  BASIC METABOLIC PANEL     Status: Abnormal   Collection Time   09/25/12  4:33 AM      Component Value Range   Sodium 134 (*) 135 - 145 mEq/L   Potassium 3.8  3.5 - 5.1 mEq/L   Chloride 99  96 - 112 mEq/L   CO2 25  19 - 32 mEq/L   Glucose, Bld 140 (*) 70 - 99 mg/dL   BUN 9  6 - 23 mg/dL   Creatinine, Ser 0.86  0.50 - 1.10 mg/dL   Calcium 8.4  8.4 - 57.8 mg/dL   GFR calc non Af Amer >90  >90 mL/min   GFR calc Af Amer >90  >90 mL/min    Dg Shoulder Right Port  09/24/2012  *RADIOLOGY REPORT*  Clinical Data: Right shoulder replacement.  PORTABLE RIGHT SHOULDER - 2+ VIEW  Comparison: 08/02/2012 chest x-ray.  Findings: Single view right shoulder with right shoulder prosthesis appearing in satisfactory position.  Remote resection distal right clavicle.  Visualized lungs clear.   IMPRESSION: Right shoulder replacement appears in satisfactory position on this single projection.   Original Report Authenticated By: Lacy Duverney, M.D.     Assessment/Plan: 1 Day Post-Op   Principal Problem:  *Osteoarthritis of right shoulder region   Advance diet Up with therapy Plan for discharge tomorrow Continue  PO pain in the afternoon and taper off Dilaudid    Haskel Khan 09/25/2012, 9:46 AM   Teryl Lucy, MD Cell [phone] Pager [phone]

## 2012-09-25 NOTE — Progress Notes (Signed)
Occupational Therapy Treatment Patient Details Name: Gabrielle Duncan MRN: [id number] DOB: January 16, 1956 Today's Date: 09/25/2012 Time: 7371-0626 OT Time Calculation (min): 40 min  OT Assessment / Plan / Recommendation Comments on Treatment Session Excellent session. Pt very appreciative of information. Will plan to see again in am and give written information. If pain is well controlled and pt medically stable, Pt will be appropriate to D/C after OT.. Pt to follow up with Dr. Dion Saucier.    Follow Up Recommendations  Outpatient OT;Other (comment)    Barriers to Discharge  None    Equipment Recommendations  Tub/shower seat    Recommendations for Other Services  none  Frequency Min 3X/week   Plan Discharge plan remains appropriate    Precautions / Restrictions Precautions Precautions: Shoulder Type of Shoulder Precautions: no shoulder ROM. sling at all times with exception of ADL and exercise Precaution Booklet Issued: No Precaution Comments: no shoulder ROM. elbow, wrist and hand ROM only Required Braces or Orthoses: Other Brace/Splint Restrictions Weight Bearing Restrictions: Yes RUE Weight Bearing: Non weight bearing   Pertinent Vitals/Pain 5. shoulder    ADL  Eating/Feeding: Set up Where Assessed - Eating/Feeding: Bed level Grooming: Minimal assistance Where Assessed - Grooming: Unsupported sitting Upper Body Bathing: Minimal assistance Where Assessed - Upper Body Bathing: Unsupported sitting Lower Body Bathing: Minimal assistance Where Assessed - Lower Body Bathing: Supported sit to stand Upper Body Dressing: Moderate assistance Where Assessed - Upper Body Dressing: Unsupported sitting Lower Body Dressing: Set up Where Assessed - Lower Body Dressing: Supported sit to stand Toilet Transfer: Modified independent Statistician Method: Sit to Barista: Comfort height toilet Toileting - Clothing Manipulation and Hygiene: Minimal assistance Where  Assessed - Engineer, mining and Hygiene: Sit to stand from 3-in-1 or toilet;Standing Transfers/Ambulation Related to ADLs: mod I ADL Comments: Educated pt on compensatory techniues for bathing and dresing and sling mgnt. Used Teach Back method for education. Pt required Min A for UE B/D.    OT Diagnosis: Generalized weakness;Acute pain  OT Problem List: Decreased strength;Decreased range of motion;Decreased activity tolerance;Decreased knowledge of use of DME or AE;Decreased knowledge of precautions;Impaired UE functional use;Pain OT Treatment Interventions: Self-care/ADL training;Therapeutic exercise;Energy conservation;DME and/or AE instruction;Therapeutic activities;Patient/family education   OT Goals Acute Rehab OT Goals OT Goal Formulation: With patient Time For Goal Achievement: 10/09/12 Potential to Achieve Goals: Good ADL Goals Pt Will Perform Upper Body Bathing: with set-up;with supervision;Sitting, edge of bed;Unsupported ADL Goal: Upper Body Bathing - Progress: Progressing toward goals Pt Will Perform Upper Body Dressing: with set-up;with supervision;Sitting, bed;Unsupported ADL Goal: Upper Body Dressing - Progress: Progressing toward goals Additional ADL Goal #1: Pt will donn/doff sling with S and verbalize appropriate sling wearing schedule ADL Goal: Additional Goal #1 - Progress: Progressing toward goals Arm Goals Pt Will Perform AROM: Independently;Right upper extremity;10 reps;Other (comment) Arm Goal: AROM - Progress: Progressing toward goal Additional Arm Goal #1: Pt will verbalize appropriate positioning of RUE at rest with support behind shoulder. Arm Goal: Additional Goal #1 - Progress: Met  Visit Information  Last OT Received On: 09/25/12 Assistance Needed: +1    Subjective Data  Subjective: I'm feeling better   Prior Functioning  Home Living Lives With: Significant other Available Help at Discharge: Available 24 hours/day Type of Home:  House Home Access: Stairs to enter [employer] of Steps: 1 Entrance Stairs-Rails: None Home Layout: One level Bathroom Shower/Tub: Health visitor: Standard Bathroom Accessibility: Yes How Accessible: Accessible via walker Home Adaptive Equipment:  Straight cane Prior Function Level of Independence: Independent Able to Take Stairs?: Yes Driving: Yes 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 Transfers Transfers: Sit to Stand;Stand to Sit Sit to Stand: 6: Modified independent (Device/Increase time)       Exercises  General Exercises - Upper Extremity Elbow Flexion: AROM;AAROM;Right;10 reps;Seated Elbow Extension: AROM;AAROM;Right;10 reps;Seated Wrist Flexion: AROM;AAROM;Right;10 reps;Seated Wrist Extension: AROM;AAROM;Right;10 reps;Seated Digit Composite Flexion: AROM;AAROM;Right;10 reps;Seated Composite Extension: AROM;AAROM;Right;10 reps;Seated   Balance Balance Balance Assessed:  (WFL)   End of Session OT - End of Session Equipment Utilized During Treatment: Other (comment) (sling) Activity Tolerance: Patient tolerated treatment well Patient left: in bed Nurse Communication: Precautions;Weight bearing status;Mobility status  GO     Lavoris Sparling,HILLARY 09/25/2012, 11:59 AM [facility], OTR/L  [phone] 09/25/2012

## 2012-09-26 ENCOUNTER — Encounter (HOSPITAL_COMMUNITY): Payer: Self-pay | Admitting: Orthopedic Surgery

## 2012-09-26 NOTE — Progress Notes (Signed)
Occupational Therapy Treatment Patient Details Name: Gabrielle Duncan MRN: [id number] DOB: January 24, 1956 Today's Date: 09/26/2012 Time: 4098-1191 OT Time Calculation (min): 31 min  OT Assessment / Plan / Recommendation Comments on Treatment Session Pt tolerated session well with minimal pain. Issued shoulder care handout and reviewed all infomation. Pt states significant other will be assisting PRN at d/c.     Follow Up Recommendations  Outpatient OT;Other (comment) (when recommended by MD)    Barriers to Discharge       Equipment Recommendations  Tub/shower seat    Recommendations for Other Services    Frequency Min 3X/week   Plan Discharge plan remains appropriate    Precautions / Restrictions Precautions Precautions: Shoulder Type of Shoulder Precautions: no shoulder ROM. sling at all times with exception of ADL and exercise Precaution Booklet Issued: No Precaution Comments: no shoulder ROM. elbow, wrist and hand ROM only Required Braces or Orthoses: Other Brace/Splint Restrictions Weight Bearing Restrictions: Yes RUE Weight Bearing: Non weight bearing        ADL  Upper Body Dressing: Supervision/safety;Other (comment) (to doff and don sling with min verbal cues) Where Assessed - Upper Body Dressing: Unsupported standing ADL Comments: Issued shoulder care handout and reviewed all instructions with pt. She verbalized understanding of all info. Pt performed elbow, wrist and hand exercises x 10 reps each exercise with supervision. She needed min verbal cues to not move R shoulder while trying to don sling but was able to don/doff sling without physical assist. Reviewed how to don/doff a shirt also. She had already washed up this am.     OT Diagnosis:    OT Problem List:   OT Treatment Interventions:     OT Goals ADL Goals ADL Goal: Additional Goal #1 - Progress: Met Arm Goals Arm Goal: AROM - Progress: Progressing toward goal  Visit Information  Last OT Received On:  09/26/12 Assistance Needed: +1    Subjective Data  Subjective: I was so groggy yesterday Patient Stated Goal: to go home when ready   Prior Functioning       Cognition  Overall Cognitive Status: Appears within functional limits for tasks assessed/performed Arousal/Alertness: Awake/alert Orientation Level: Appears intact for tasks assessed Behavior During Session: [facility] for tasks performed    Mobility  Shoulder Instructions     Correct positioning of sling/immobilizer: Supervision/safety ROM for elbow, wrist and digits of operated UE: Supervision/safety   Exercises      Balance     End of Session OT - End of Session Activity Tolerance: Patient tolerated treatment well Patient left: in bed;with call bell/phone within reach  GO     Lennox Laity [phone] 09/26/2012, 9:38 AM

## 2012-09-26 NOTE — Progress Notes (Signed)
Patient ID: Gabrielle Duncan, female   DOB: 1956/01/30, 56 y.o.   MRN: [id number]     Subjective:  Patient reports pain as mild to moderate.  Pain better contoled on PO pain meds today.  Patient does not think that she will have enough help at home today to go home  Objective:   VITALS:   Filed Vitals:   09/25/12 1846 09/25/12 2157 09/26/12 0242 09/26/12 0400  BP: 123/71 112/73 112/75 125/79  Pulse: 86 83 77 92  Temp: 98 F (36.7 C) 98.1 F (36.7 C) 98.1 F (36.7 C) 99.3 F (37.4 C)  TempSrc: Oral Oral Oral Oral  Resp: 16 14 16 16   Height:      Weight:      SpO2: 98% 95% 98% 99%    ABD soft Sensation intact distally Dorsiflexion/Plantar flexion intact Incision: dressing C/D/I and no drainage  LABS  No results found for this or any previous visit (from the past 24 hour(s)).  Dg Shoulder Right Port  09/24/2012  *RADIOLOGY REPORT*  Clinical Data: Right shoulder replacement.  PORTABLE RIGHT SHOULDER - 2+ VIEW  Comparison: 08/02/2012 chest x-ray.  Findings: Single view right shoulder with right shoulder prosthesis appearing in satisfactory position.  Remote resection distal right clavicle.  Visualized lungs clear.  IMPRESSION: Right shoulder replacement appears in satisfactory position on this single projection.   Original Report Authenticated By: Lacy Duverney, M.D.     Assessment/Plan: 2 Days Post-Op   Principal Problem:  *Osteoarthritis of right shoulder region   Advance diet Up with therapy Plan for discharge tomorrow if passes PT today   Haskel Khan 09/26/2012, 7:12 AM   Teryl Lucy, MD Cell [phone] Pager [phone]

## 2012-09-26 NOTE — Progress Notes (Signed)
Utilization review completed.  

## 2012-09-27 NOTE — Progress Notes (Signed)
Pt to d/c home. AVS reviewed and "My Chart" discussed with pt. Pt capable of verbalizing medications and follow-up appointments. Remains hemodynamically stable. No signs and symptoms of distress. Educated pt to return to ER in the case of SOB, dizziness, or chest pain.  

## 2012-09-27 NOTE — Progress Notes (Addendum)
Occupational Therapy Treatment Patient Details Name: Gabrielle Duncan MRN: [id number] DOB: November 30, 1955 Today's Date: 09/27/2012 Time: 0865-7846 OT Time Calculation (min): 14 min  OT Assessment / Plan / Recommendation Comments on Treatment Session Pt supposed to d/c today. All questions answered for pt and significant other.     Follow Up Recommendations  Outpatient OT;Other (comment) (outpatient when MD advises)    Barriers to Discharge       Equipment Recommendations  Tub/shower seat (if pt desires)    Recommendations for Other Services    Frequency Min 3X/week   Plan Discharge plan remains appropriate    Precautions / Restrictions Precautions Precautions: Shoulder Type of Shoulder Precautions: no shoulder ROM. sling at all times with exception of ADL and exercise Precaution Booklet Issued: No Precaution Comments: no shoulder ROM. elbow, wrist and hand ROM only Required Braces or Orthoses: Other Brace/Splint Restrictions Weight Bearing Restrictions: Yes RUE Weight Bearing: Non weight bearing        ADL  ADL Comments: Pt with significant other in room when OT arrived. Pt for d/c today. Significant other with some questions regarding shoulder care. Reviewed information from handout with pt/significant other and answered all questions. Pt had just finished doing her elbow, wrist and hand exercises per her report just before OT arrived. Noted pt with pullover shirt on and sling. Discussed with both that it is advisable to only wear a button down shirt right now so she doesnt move her shoulder trying to don/doff. Pt states she has  plenty of button down shirts at home and will wear them. Demonstrated/reviewed proper technique for elbow, wrist and hand exersies as well as how to wash under operated arm. No further questions. Also reviewed with significant other proper positioning of R UE in the bed/recliner with pillow placement for support.     OT Diagnosis:    OT Problem List:     OT Treatment Interventions:     OT Goals ADL Goals ADL Goal: Upper Body Bathing - Progress: Other (comment) (pt had already dressed. verbally reviewed techique.) ADL Goal: Upper Body Dressing - Progress: Other (comment) (pt already with sling on when OT arrived. verbally reviewed)  Visit Information  Last OT Received On: 09/27/12 Assistance Needed: +1    Subjective Data  Subjective: I did the sling all by myself Patient Stated Goal: home   Prior Functioning       Cognition  Overall Cognitive Status: Appears within functional limits for tasks assessed/performed Arousal/Alertness: Awake/alert Orientation Level: Appears intact for tasks assessed Behavior During Session: [facility] for tasks performed    Mobility  Shoulder Instructions Transfers Transfers: Sit to Stand;Stand to Sit Sit to Stand: 6: Modified independent (Device/Increase time) Stand to Sit: 6: Modified independent (Device/Increase time)       Exercises      Balance     End of Session OT - End of Session Activity Tolerance: Patient tolerated treatment well Patient left: with call bell/phone within reach;Other (comment) (EOB with nursing and family)  GO     Lennox Laity [phone] 09/27/2012, 10:21 AM

## 2012-09-27 NOTE — Discharge Summary (Signed)
Physician Discharge Summary  Patient ID: Gabrielle Duncan MRN: [id number] DOB/AGE: [birth year] 56 y.o.  Admit date: 09/24/2012 Discharge date: 09/27/2012  Admission Diagnoses:  Osteoarthritis of right shoulder region  Discharge Diagnoses:  Principal Problem:  *Osteoarthritis of right shoulder region   Past Medical History  Diagnosis Date  . Arthritis   . Eczema   . Osteoarthritis of right shoulder region 09/24/2012    Surgeries: Procedure(s): TOTAL SHOULDER ARTHROPLASTY on 09/24/2012   Consultants (if any):    Discharged Condition: Improved  Hospital Course: Gabrielle Duncan is an 56 y.o. female who was admitted 09/24/2012 with a diagnosis of Osteoarthritis of right shoulder region and went to the operating room on 09/24/2012 and underwent the above named procedures.    She was given perioperative antibiotics:  Anti-infectives     Start     Dose/Rate Route Frequency Ordered Stop   09/24/12 1300   ceFAZolin (ANCEF) IVPB 2 g/50 mL premix        2 g 100 mL/hr over 30 Minutes Intravenous Every 6 hours 09/24/12 1213 09/25/12 0140   09/24/12 0532   ceFAZolin (ANCEF) IVPB 2 g/50 mL premix        2 g 100 mL/hr over 30 Minutes Intravenous 60 min pre-op 09/24/12 0532 09/24/12 0743        .  She was given sequential compression devices, early ambulation, for DVT prophylaxis. She initially was having functional difficulties, and did not have adequate home support, and required an extruded day in the hospital. She also was given IV as well as by mouth pain medications.  She benefited maximally from the hospital stay and there were no complications.    Recent vital signs:  Filed Vitals:   09/27/12 0549  BP: 114/71  Pulse: 73  Temp: 98.4 F (36.9 C)  Resp: 16    Recent laboratory studies:  Lab Results  Component Value Date   HGB 11.2* 09/25/2012   HGB 13.5 09/18/2012   HGB 13.7 08/02/2012   Lab Results  Component Value Date   WBC 9.5 09/25/2012   PLT 256  09/25/2012   Lab Results  Component Value Date   INR 0.82 09/18/2012   Lab Results  Component Value Date   NA 134* 09/25/2012   K 3.8 09/25/2012   CL 99 09/25/2012   CO2 25 09/25/2012   BUN 9 09/25/2012   CREATININE 0.65 09/25/2012   GLUCOSE 140* 09/25/2012    Discharge Medications:     Medication List     As of 09/27/2012  6:57 AM    STOP taking these medications         acetaminophen 500 MG tablet   Commonly known as: TYLENOL      ibuprofen 200 MG tablet   Commonly known as: ADVIL,MOTRIN      TAKE these medications         Cinnamon 500 MG capsule   Take 500 mg by mouth daily.      loratadine 10 MG tablet   Commonly known as: CLARITIN   Take 10 mg by mouth daily.      methocarbamol 500 MG tablet   Commonly known as: ROBAXIN   Take 1 tablet (500 mg total) by mouth 4 (four) times daily.      oxyCODONE-acetaminophen 10-325 MG per tablet   Commonly known as: PERCOCET   Take 1-2 tablets by mouth every 6 (six) hours as needed for pain. MAXIMUM TOTAL ACETAMINOPHEN DOSE IS 4000 MG PER DAY  promethazine 25 MG tablet   Commonly known as: PHENERGAN   Take 1 tablet (25 mg total) by mouth every 6 (six) hours as needed for nausea.      sennosides-docusate sodium 8.6-50 MG tablet   Commonly known as: SENOKOT-S   Take 1 tablet by mouth daily.        Diagnostic Studies: Dg Shoulder Right Port  October 11, 2012  *RADIOLOGY REPORT*  Clinical Data: Right shoulder replacement.  PORTABLE RIGHT SHOULDER - 2+ VIEW  Comparison: 08/02/2012 chest x-ray.  Findings: Single view right shoulder with right shoulder prosthesis appearing in satisfactory position.  Remote resection distal right clavicle.  Visualized lungs clear.  IMPRESSION: Right shoulder replacement appears in satisfactory position on this single projection.   Original Report Authenticated By: Lacy Duverney, M.D.     Disposition: 01-Home or Self Care      Discharge Orders    Future Orders Please Complete By Expires    Diet general      Call MD / Call 911      Comments:   If you experience chest pain or shortness of breath, CALL 911 and be transported to the hospital emergency room.  If you develope a fever above 101 F, pus (white drainage) or increased drainage or redness at the wound, or calf pain, call your surgeon's office.   Discharge instructions      Comments:   Change dressing in 3 days and reapply fresh dressing, unless you have a splint (half cast).  If you have a splint/cast, just leave in place until your follow-up appointment.    Keep wounds dry for 3 weeks.  Leave steri-strips in place on skin.  Do not apply lotion or anything to the wound.   Constipation Prevention      Comments:   Drink plenty of fluids.  Prune juice may be helpful.  You may use a stool softener, such as Colace (over the counter) 100 mg twice a day.  Use MiraLax (over the counter) for constipation as needed.   OT splint   October 11, 2013   Comments:   Ok for elbow, wrist, and hand motion.  Sling at all times except for hygiene, no shoulder activity until 6 weeks.      Follow-up Information    Follow up with Eulas Post, MD. Schedule an appointment as soon as possible for a visit in 2 weeks.   Contact information:   [address] [place] [address] [phone]           Signed: Eulas Post 09/27/2012, 6:57 AM

## 2012-09-27 NOTE — Progress Notes (Signed)
Discharge summary sent to payer through [facility]  

## 2012-12-09 ENCOUNTER — Other Ambulatory Visit: Payer: Self-pay | Admitting: Neurological Surgery

## 2012-12-23 ENCOUNTER — Encounter (HOSPITAL_COMMUNITY): Payer: Self-pay

## 2012-12-24 NOTE — Pre-Procedure Instructions (Signed)
DONNIA POPLASKI  12/24/2012   Your procedure is scheduled on:  Wednesday, April 2nd  Report to [facility] at 1130 AM.  Call this number if you have problems the morning of surgery: [phone]   Remember:   Do not eat food or drink liquids after midnight.    Take these medicines the morning of surgery with A SIP OF WATER: claritin if needed   Do not wear jewelry, make-up or nail polish.  Do not wear lotions, powders, or perfumes, deodorant.  Do not shave 48 hours prior to surgery.   Do not bring valuables to the hospital.  Contacts, dentures or bridgework may not be worn into surgery.  Leave suitcase in the car. After surgery it may be brought to your room.  For patients admitted to the hospital, checkout time is 11:00 AM the day of discharge.   Patients discharged the day of surgery will not be allowed to drive home.   Special Instructions: Shower using CHG 2 nights before surgery and the night before surgery.  If you shower the day of surgery use CHG.  Use special wash - you have one bottle of CHG for all showers.  You should use approximately 1/3 of the bottle for each shower.   Please read over the following fact sheets that you were given: Pain Booklet, Coughing and Deep Breathing, Blood Transfusion Information, MRSA Information and Surgical Site Infection Prevention

## 2012-12-25 ENCOUNTER — Encounter (HOSPITAL_COMMUNITY): Payer: Self-pay

## 2012-12-25 ENCOUNTER — Encounter (HOSPITAL_COMMUNITY)
Admission: RE | Admit: 2012-12-25 | Discharge: 2012-12-25 | Disposition: A | Discharge status: Home / Self Care | Payer: BC Managed Care – PPO | Source: Ambulatory Visit | Attending: Neurological Surgery | Admitting: Neurological Surgery

## 2012-12-25 LAB — BASIC METABOLIC PANEL
BUN: 12 mg/dL (ref 6–23)
Calcium: 9.3 mg/dL (ref 8.4–10.5)
GFR calc Af Amer: >90 mL/min (ref >90)
GFR calc non Af Amer: >90 mL/min (ref >90)
Glucose, Bld: 101 mg/dL — ABNORMAL HIGH (ref 70–99)
Potassium: 4.2 mEq/L (ref 3.5–5.1)
Sodium: 141 mEq/L (ref 135–145)

## 2012-12-25 LAB — CBC WITH DIFFERENTIAL/PLATELET
Basophils Relative: 1 % (ref 0–1)
Eosinophils Absolute: 0.4 10*3/uL (ref 0.0–0.7)
Hemoglobin: 13.1 g/dL (ref 12.0–15.0)
MCH: 29.6 pg (ref 26.0–34.0)
MCHC: 34.2 g/dL (ref 30.0–36.0)
Neutro Abs: 3.1 10*3/uL (ref 1.7–7.7)
Neutrophils Relative %: 51 % (ref 43–77)
Platelets: 262 10*3/uL (ref 150–400)
RBC: 4.43 MIL/uL (ref 3.87–5.11)

## 2012-12-25 LAB — PROTIME-INR
INR: 0.86 (ref 0.00–1.49)
Prothrombin Time: 11.7 seconds (ref 11.6–15.2)

## 2012-12-25 LAB — TYPE AND SCREEN: ABO/RH(D): O POS

## 2012-12-25 LAB — SURGICAL PCR SCREEN
MRSA, PCR: NEGATIVE
Staphylococcus aureus: POSITIVE — AB

## 2012-12-31 DIAGNOSIS — R222 Localized swelling, mass and lump, trunk: Secondary | ICD-10-CM

## 2012-12-31 HISTORY — DX: Localized swelling, mass and lump, trunk: R22.2

## 2012-12-31 MED ORDER — CEFAZOLIN SODIUM-DEXTROSE 2-3 GM-% IV SOLR
2.0000 g | INTRAVENOUS | Status: AC
  Administered 2013-01-01: 2 g via INTRAVENOUS
  Filled 2012-12-31: qty 50

## 2013-01-01 ENCOUNTER — Inpatient Hospital Stay (HOSPITAL_COMMUNITY): Payer: BC Managed Care – PPO

## 2013-01-01 ENCOUNTER — Inpatient Hospital Stay (HOSPITAL_COMMUNITY)
Admission: RE | Admit: 2013-01-01 | Discharge: 2013-01-03 | DRG: 756 | Disposition: A | Discharge status: Home / Self Care | Payer: BC Managed Care – PPO | Source: Ambulatory Visit | Attending: Neurological Surgery | Admitting: Neurological Surgery

## 2013-01-01 ENCOUNTER — Encounter (HOSPITAL_COMMUNITY)
Admission: RE | Disposition: A | Discharge status: Home / Self Care | Payer: Self-pay | Source: Ambulatory Visit | Attending: Neurological Surgery

## 2013-01-01 ENCOUNTER — Inpatient Hospital Stay (HOSPITAL_COMMUNITY): Payer: BC Managed Care – PPO | Admitting: Anesthesiology

## 2013-01-01 ENCOUNTER — Encounter (HOSPITAL_COMMUNITY): Payer: Self-pay | Admitting: Anesthesiology

## 2013-01-01 ENCOUNTER — Encounter (HOSPITAL_COMMUNITY): Payer: Self-pay | Admitting: Neurological Surgery

## 2013-01-01 DIAGNOSIS — Z791 Long term (current) use of non-steroidal anti-inflammatories (NSAID): Secondary | ICD-10-CM

## 2013-01-01 DIAGNOSIS — Z96619 Presence of unspecified artificial shoulder joint: Secondary | ICD-10-CM

## 2013-01-01 DIAGNOSIS — M4310 Spondylolisthesis, site unspecified: Principal | ICD-10-CM | POA: Diagnosis present

## 2013-01-01 DIAGNOSIS — M48061 Spinal stenosis, lumbar region without neurogenic claudication: Secondary | ICD-10-CM | POA: Diagnosis present

## 2013-01-01 DIAGNOSIS — L259 Unspecified contact dermatitis, unspecified cause: Secondary | ICD-10-CM | POA: Diagnosis present

## 2013-01-01 DIAGNOSIS — Z9089 Acquired absence of other organs: Secondary | ICD-10-CM

## 2013-01-01 DIAGNOSIS — Z79899 Other long term (current) drug therapy: Secondary | ICD-10-CM

## 2013-01-01 DIAGNOSIS — Z981 Arthrodesis status: Secondary | ICD-10-CM

## 2013-01-01 DIAGNOSIS — Z90710 Acquired absence of both cervix and uterus: Secondary | ICD-10-CM

## 2013-01-01 HISTORY — PX: LUMBAR LAMINECTOMY/DECOMPRESSION MICRODISCECTOMY: SHX5026

## 2013-01-01 HISTORY — PX: LUMBAR FUSION: SHX111

## 2013-01-01 SURGERY — FOR MAXIMUM ACCESS (MAS) POSTERIOR LUMBAR INTERBODY FUSION (PLIF) 1 LEVEL
Anesthesia: General | Site: Back | Wound class: Clean

## 2013-01-01 MED ORDER — SODIUM CHLORIDE 0.9 % IV SOLN
INTRAVENOUS | Status: AC
  Filled 2013-01-01: qty 500

## 2013-01-01 MED ORDER — DEXAMETHASONE SODIUM PHOSPHATE 10 MG/ML IJ SOLN
INTRAMUSCULAR | Status: AC
  Administered 2013-01-01: 10 mg via INTRAVENOUS
  Filled 2013-01-01: qty 1

## 2013-01-01 MED ORDER — BACITRACIN 50000 UNITS IM SOLR
INTRAMUSCULAR | Status: AC
  Filled 2013-01-01: qty 1

## 2013-01-01 MED ORDER — SODIUM CHLORIDE 0.9 % IJ SOLN
3.0000 mL | INTRAMUSCULAR | Status: DC | PRN
  Administered 2013-01-01: 3 mL via INTRAVENOUS

## 2013-01-01 MED ORDER — PHENYLEPHRINE HCL 10 MG/ML IJ SOLN
INTRAMUSCULAR | Status: DC | PRN
  Administered 2013-01-01 (×3): 40 ug via INTRAVENOUS

## 2013-01-01 MED ORDER — LIDOCAINE HCL 4 % MT SOLN
OROMUCOSAL | Status: DC | PRN
  Administered 2013-01-01: 4 mL via TOPICAL

## 2013-01-01 MED ORDER — HYDROMORPHONE HCL PF 1 MG/ML IJ SOLN
0.2500 mg | INTRAMUSCULAR | Status: DC | PRN
  Administered 2013-01-01 (×2): 0.5 mg via INTRAVENOUS

## 2013-01-01 MED ORDER — ACETAMINOPHEN 10 MG/ML IV SOLN
INTRAVENOUS | Status: AC
  Administered 2013-01-01: 1000 mg via INTRAVENOUS
  Filled 2013-01-01: qty 100

## 2013-01-01 MED ORDER — DEXAMETHASONE SODIUM PHOSPHATE 10 MG/ML IJ SOLN: 10.0000 mg | INTRAMUSCULAR | Status: DC

## 2013-01-01 MED ORDER — ACETAMINOPHEN 650 MG RE SUPP: 650.0000 mg | RECTAL | Status: DC | PRN

## 2013-01-01 MED ORDER — POTASSIUM CHLORIDE IN NACL 20-0.9 MEQ/L-% IV SOLN
INTRAVENOUS | Status: DC
  Filled 2013-01-01 (×5): qty 1000

## 2013-01-01 MED ORDER — HYDROMORPHONE HCL PF 1 MG/ML IJ SOLN
INTRAMUSCULAR | Status: AC
  Filled 2013-01-01: qty 1

## 2013-01-01 MED ORDER — PROPOFOL 10 MG/ML IV BOLUS
INTRAVENOUS | Status: DC | PRN
  Administered 2013-01-01: 200 mg via INTRAVENOUS

## 2013-01-01 MED ORDER — LACTATED RINGERS IV SOLN
INTRAVENOUS | Status: DC | PRN
  Administered 2013-01-01 (×3): via INTRAVENOUS

## 2013-01-01 MED ORDER — MIDAZOLAM HCL 5 MG/5ML IJ SOLN
INTRAMUSCULAR | Status: DC | PRN
  Administered 2013-01-01: 2 mg via INTRAVENOUS

## 2013-01-01 MED ORDER — EPHEDRINE SULFATE 50 MG/ML IJ SOLN
INTRAMUSCULAR | Status: DC | PRN
  Administered 2013-01-01 (×2): 5 mg via INTRAVENOUS
  Administered 2013-01-01: 10 mg via INTRAVENOUS

## 2013-01-01 MED ORDER — ACETAMINOPHEN 325 MG PO TABS: 650.0000 mg | ORAL_TABLET | ORAL | Status: DC | PRN

## 2013-01-01 MED ORDER — MENTHOL 3 MG MT LOZG: 1.0000 | LOZENGE | OROMUCOSAL | Status: DC | PRN

## 2013-01-01 MED ORDER — CEFAZOLIN SODIUM 1-5 GM-% IV SOLN
1.0000 g | Freq: Three times a day (TID) | INTRAVENOUS | Status: AC
  Administered 2013-01-01 – 2013-01-02 (×2): 1 g via INTRAVENOUS
  Filled 2013-01-01 (×2): qty 50

## 2013-01-01 MED ORDER — SUCCINYLCHOLINE CHLORIDE 20 MG/ML IJ SOLN
INTRAMUSCULAR | Status: DC | PRN
  Administered 2013-01-01: 120 mg via INTRAVENOUS

## 2013-01-01 MED ORDER — SODIUM CHLORIDE 0.9 % IR SOLN
Status: DC | PRN
  Administered 2013-01-01: 14:00:00

## 2013-01-01 MED ORDER — CELECOXIB 200 MG PO CAPS
200.0000 mg | ORAL_CAPSULE | Freq: Two times a day (BID) | ORAL | Status: DC
  Administered 2013-01-01 – 2013-01-02 (×3): 200 mg via ORAL
  Filled 2013-01-01 (×5): qty 1

## 2013-01-01 MED ORDER — MORPHINE SULFATE 2 MG/ML IJ SOLN: 1.0000 mg | INTRAMUSCULAR | Status: DC | PRN

## 2013-01-01 MED ORDER — SODIUM CHLORIDE 0.9 % IJ SOLN
3.0000 mL | Freq: Two times a day (BID) | INTRAMUSCULAR | Status: DC
  Administered 2013-01-01 – 2013-01-02 (×3): 3 mL via INTRAVENOUS

## 2013-01-01 MED ORDER — OXYCODONE HCL 5 MG PO TABS
ORAL_TABLET | ORAL | Status: AC
  Filled 2013-01-01: qty 1

## 2013-01-01 MED ORDER — PHENYLEPHRINE HCL 10 MG/ML IJ SOLN
10.0000 mg | INTRAVENOUS | Status: DC | PRN
  Administered 2013-01-01: 50 ug/min via INTRAVENOUS

## 2013-01-01 MED ORDER — ALBUMIN HUMAN 5 % IV SOLN
INTRAVENOUS | Status: DC | PRN
  Administered 2013-01-01: 14:00:00 via INTRAVENOUS

## 2013-01-01 MED ORDER — OXYCODONE HCL 5 MG/5ML PO SOLN: 5.0000 mg | Freq: Once | ORAL | Status: AC | PRN

## 2013-01-01 MED ORDER — METHOCARBAMOL 100 MG/ML IJ SOLN
500.0000 mg | Freq: Four times a day (QID) | INTRAVENOUS | Status: DC | PRN
  Filled 2013-01-01: qty 5

## 2013-01-01 MED ORDER — STERILE WATER FOR IRRIGATION IR SOLN
Status: DC | PRN
  Administered 2013-01-01: 1000 mL

## 2013-01-01 MED ORDER — METHOCARBAMOL 500 MG PO TABS
500.0000 mg | ORAL_TABLET | Freq: Four times a day (QID) | ORAL | Status: DC | PRN
  Administered 2013-01-01 – 2013-01-03 (×4): 500 mg via ORAL
  Filled 2013-01-01 (×5): qty 1

## 2013-01-01 MED ORDER — ACETAMINOPHEN 10 MG/ML IV SOLN
1000.0000 mg | Freq: Four times a day (QID) | INTRAVENOUS | Status: AC
  Administered 2013-01-01 – 2013-01-02 (×3): 1000 mg via INTRAVENOUS
  Filled 2013-01-01 (×5): qty 100

## 2013-01-01 MED ORDER — 0.9 % SODIUM CHLORIDE (POUR BTL) OPTIME
TOPICAL | Status: DC | PRN
  Administered 2013-01-01: 1000 mL

## 2013-01-01 MED ORDER — DEXAMETHASONE 4 MG PO TABS
4.0000 mg | ORAL_TABLET | Freq: Four times a day (QID) | ORAL | Status: DC
  Administered 2013-01-01 – 2013-01-03 (×5): 4 mg via ORAL
  Filled 2013-01-01 (×10): qty 1

## 2013-01-01 MED ORDER — ONDANSETRON HCL 4 MG/2ML IJ SOLN
INTRAMUSCULAR | Status: DC | PRN
  Administered 2013-01-01: 4 mg via INTRAVENOUS

## 2013-01-01 MED ORDER — DEXAMETHASONE SODIUM PHOSPHATE 4 MG/ML IJ SOLN
4.0000 mg | Freq: Four times a day (QID) | INTRAMUSCULAR | Status: DC
  Administered 2013-01-01 – 2013-01-02 (×2): 4 mg via INTRAVENOUS
  Filled 2013-01-01 (×10): qty 1

## 2013-01-01 MED ORDER — FENTANYL CITRATE 0.05 MG/ML IJ SOLN
INTRAMUSCULAR | Status: DC | PRN
  Administered 2013-01-01: 100 ug via INTRAVENOUS
  Administered 2013-01-01: 50 ug via INTRAVENOUS
  Administered 2013-01-01: 25 ug via INTRAVENOUS
  Administered 2013-01-01 (×2): 50 ug via INTRAVENOUS
  Administered 2013-01-01: 25 ug via INTRAVENOUS
  Administered 2013-01-01 (×4): 50 ug via INTRAVENOUS

## 2013-01-01 MED ORDER — ONDANSETRON HCL 4 MG/2ML IJ SOLN
4.0000 mg | INTRAMUSCULAR | Status: DC | PRN
  Administered 2013-01-01 – 2013-01-02 (×2): 4 mg via INTRAVENOUS
  Filled 2013-01-01 (×2): qty 2

## 2013-01-01 MED ORDER — PHENOL 1.4 % MT LIQD: 1.0000 | OROMUCOSAL | Status: DC | PRN

## 2013-01-01 MED ORDER — ARTIFICIAL TEARS OP OINT
TOPICAL_OINTMENT | OPHTHALMIC | Status: DC | PRN
  Administered 2013-01-01: 1 via OPHTHALMIC

## 2013-01-01 MED ORDER — OXYCODONE-ACETAMINOPHEN 5-325 MG PO TABS
1.0000 | ORAL_TABLET | ORAL | Status: DC | PRN
  Administered 2013-01-01 – 2013-01-03 (×6): 2 via ORAL
  Filled 2013-01-01 (×6): qty 2

## 2013-01-01 MED ORDER — LIDOCAINE HCL (CARDIAC) 20 MG/ML IV SOLN
INTRAVENOUS | Status: DC | PRN
  Administered 2013-01-01: 30 mg via INTRAVENOUS

## 2013-01-01 MED ORDER — THROMBIN 20000 UNITS EX SOLR
CUTANEOUS | Status: DC | PRN
  Administered 2013-01-01: 14:00:00 via TOPICAL

## 2013-01-01 MED ORDER — PROMETHAZINE HCL 25 MG/ML IJ SOLN: 6.2500 mg | INTRAMUSCULAR | Status: DC | PRN

## 2013-01-01 MED ORDER — OXYCODONE HCL 5 MG PO TABS
5.0000 mg | ORAL_TABLET | Freq: Once | ORAL | Status: AC | PRN
  Administered 2013-01-01: 5 mg via ORAL

## 2013-01-01 SURGICAL SUPPLY — 69 items
APL SKNCLS STERI-STRIP NONHPOA (GAUZE/BANDAGES/DRESSINGS) ×1
BAG DECANTER FOR FLEXI CONT (MISCELLANEOUS) ×2 IMPLANT
BENZOIN TINCTURE PRP APPL 2/3 (GAUZE/BANDAGES/DRESSINGS) ×2 IMPLANT
BLADE SURG ROTATE 9660 (MISCELLANEOUS) IMPLANT
BONE MATRIX OSTEOCEL PLUS 10CC (Bone Implant) ×1 IMPLANT
BUR MATCHSTICK NEURO 3.0 LAGG (BURR) ×2 IMPLANT
CAGE COROENT 10X9X23 (Cage) ×1 IMPLANT
CAGE COROENT MP 11X23X9 (Cage) ×2 IMPLANT
CANISTER SUCTION 2500CC (MISCELLANEOUS) ×2 IMPLANT
CLIP NEUROVISION LG (CLIP) ×1 IMPLANT
CLOTH BEACON ORANGE TIMEOUT ST (SAFETY) ×2 IMPLANT
CONT SPEC 4OZ CLIKSEAL STRL BL (MISCELLANEOUS) ×4 IMPLANT
COVER BACK TABLE 24X17X13 BIG (DRAPES) IMPLANT
COVER TABLE BACK 60X90 (DRAPES) ×2 IMPLANT
DRAPE C-ARM 42X72 X-RAY (DRAPES) ×2 IMPLANT
DRAPE C-ARMOR (DRAPES) ×2 IMPLANT
DRAPE LAPAROTOMY 100X72X124 (DRAPES) ×2 IMPLANT
DRAPE POUCH INSTRU U-SHP 10X18 (DRAPES) ×2 IMPLANT
DRAPE SURG 17X23 STRL (DRAPES) ×2 IMPLANT
DRESSING TELFA 8X3 (GAUZE/BANDAGES/DRESSINGS) ×2 IMPLANT
DRSG OPSITE 4X5.5 SM (GAUZE/BANDAGES/DRESSINGS) ×4 IMPLANT
DRSG OPSITE POSTOP 4X6 (GAUZE/BANDAGES/DRESSINGS) ×1 IMPLANT
DURAPREP 26ML APPLICATOR (WOUND CARE) ×2 IMPLANT
ELECT REM PT RETURN 9FT ADLT (ELECTROSURGICAL) ×2
ELECTRODE REM PT RTRN 9FT ADLT (ELECTROSURGICAL) ×1 IMPLANT
EVACUATOR 1/8 PVC DRAIN (DRAIN) ×4 IMPLANT
GAUZE SPONGE 4X4 16PLY XRAY LF (GAUZE/BANDAGES/DRESSINGS) IMPLANT
GLOVE BIO SURGEON STRL SZ7.5 (GLOVE) ×2 IMPLANT
GLOVE BIO SURGEON STRL SZ8 (GLOVE) ×1 IMPLANT
GLOVE BIOGEL M 8.0 STRL (GLOVE) ×4 IMPLANT
GLOVE BIOGEL PI IND STRL 8 (GLOVE) ×1 IMPLANT
GLOVE BIOGEL PI INDICATOR 8 (GLOVE) ×1
GLOVE SS BIOGEL STRL SZ 6.5 (GLOVE) IMPLANT
GLOVE SUPERSENSE BIOGEL SZ 6.5 (GLOVE) ×2
GOWN BRE IMP SLV AUR LG STRL (GOWN DISPOSABLE) IMPLANT
GOWN BRE IMP SLV AUR XL STRL (GOWN DISPOSABLE) ×4 IMPLANT
GOWN PREVENTION PLUS XLARGE (GOWN DISPOSABLE) ×1 IMPLANT
GOWN STRL NON-REIN LRG LVL3 (GOWN DISPOSABLE) ×1 IMPLANT
GOWN STRL REIN 2XL LVL4 (GOWN DISPOSABLE) IMPLANT
HEMOSTAT POWDER KIT SURGIFOAM (HEMOSTASIS) IMPLANT
KIT BASIN OR (CUSTOM PROCEDURE TRAY) ×2 IMPLANT
KIT NDL NVM5 EMG ELECT (KITS) IMPLANT
KIT NEEDLE NVM5 EMG ELECT (KITS) ×1 IMPLANT
KIT NEEDLE NVM5 EMG ELECTRODE (KITS) ×1
KIT ROOM TURNOVER OR (KITS) ×2 IMPLANT
MILL MEDIUM DISP (BLADE) ×1 IMPLANT
NEEDLE HYPO 25X1 1.5 SAFETY (NEEDLE) ×2 IMPLANT
NS IRRIG 1000ML POUR BTL (IV SOLUTION) ×2 IMPLANT
PACK LAMINECTOMY NEURO (CUSTOM PROCEDURE TRAY) ×2 IMPLANT
PAD ARMBOARD 7.5X6 YLW CONV (MISCELLANEOUS) ×6 IMPLANT
ROD 5.5X40MM (Rod) ×2 IMPLANT
SCREW LOCK (Screw) ×8 IMPLANT
SCREW LOCK FXNS SPNE MAS PL (Screw) IMPLANT
SCREW MAS PLIF 5.5X30 (Screw) ×2 IMPLANT
SCREW PAS PLIF 5X30 (Screw) IMPLANT
SCREW SHANK 5.0X30MM (Screw) ×2 IMPLANT
SCREW TULIP 5.5 (Screw) ×1 IMPLANT
SPONGE LAP 4X18 X RAY DECT (DISPOSABLE) IMPLANT
SPONGE SURGIFOAM ABS GEL 100 (HEMOSTASIS) ×2 IMPLANT
STRIP CLOSURE SKIN 1/2X4 (GAUZE/BANDAGES/DRESSINGS) ×3 IMPLANT
SUT VIC AB 0 CT1 18XCR BRD8 (SUTURE) ×1 IMPLANT
SUT VIC AB 0 CT1 8-18 (SUTURE) ×2
SUT VIC AB 2-0 CP2 18 (SUTURE) ×2 IMPLANT
SUT VIC AB 3-0 SH 8-18 (SUTURE) ×4 IMPLANT
SYR 20ML ECCENTRIC (SYRINGE) ×2 IMPLANT
TOWEL OR 17X24 6PK STRL BLUE (TOWEL DISPOSABLE) ×2 IMPLANT
TOWEL OR 17X26 10 PK STRL BLUE (TOWEL DISPOSABLE) ×2 IMPLANT
TRAY FOLEY CATH 14FRSI W/METER (CATHETERS) ×2 IMPLANT
WATER STERILE IRR 1000ML POUR (IV SOLUTION) ×2 IMPLANT

## 2013-01-01 NOTE — H&P (Signed)
Subjective: Patient is a 57 y.o. female admitted for PLIF. Onset of symptoms was several years ago, gradually worsening since that time.  The pain is rated severe, and is located at the across the lower back and radiates to LLE. The pain is described as aching and occurs all day. The symptoms have been progressive. Symptoms are exacerbated by exercise and standing. MRI or CT showed spondylolisthesis with stenosis L4-5.   Past Medical History  Diagnosis Date  . Arthritis   . Eczema   . Osteoarthritis of right shoulder region 09/24/2012    Past Surgical History  Procedure Laterality Date  . Abdominal hysterectomy  1997  . Cholecystectomy    . Shoulder surgery      debridement and partial clavical removal  . Cervical fusion  2005  . Anterior cervical decomp/discectomy fusion  08/08/2012    Procedure: ANTERIOR CERVICAL DECOMPRESSION/DISCECTOMY FUSION 2 LEVEL/HARDWARE REMOVAL;  Surgeon: Tia Alert, MD;  Location: [facility];  Service: Neurosurgery;  Laterality: N/A;  anterior cervical five-six to six seven decompression fusion with removal plate four-five  . Total shoulder arthroplasty  09/24/2012    Procedure: TOTAL SHOULDER ARTHROPLASTY;  Surgeon: Eulas Post, MD;  Location: [facility];  Service: Orthopedics;  Laterality: Right;  . Eye surgery      for dry eye syndrome    Prior to Admission medications   Medication Sig Start Date End Date Taking? Authorizing Provider  ibuprofen (ADVIL,MOTRIN) 200 MG tablet Take 400 mg by mouth every 6 (six) hours as needed for pain.   Yes Historical Provider, MD  loratadine (CLARITIN) 10 MG tablet Take 10 mg by mouth daily as needed for allergies.    Yes Historical Provider, MD   No Known Allergies  History  Substance Use Topics  . Smoking status: Never Smoker   . Smokeless tobacco: Never Used  . Alcohol Use: No    No family history on file.   Review of Systems  Positive ROS: neg  All other systems have been reviewed and were otherwise  negative with the exception of those mentioned in the HPI and as above.  Objective: Vital signs in last 24 hours: Temp:  [97.8 F (36.6 C)] 97.8 F (36.6 C) (04/02 1140) Resp:  [16] 16 (04/02 1140) BP: (104)/(76) 104/76 mmHg (04/02 1140) SpO2:  [100 %] 100 % (04/02 1140)  General Appearance: Alert, cooperative, no distress, appears stated age Head: Normocephalic, without obvious abnormality, atraumatic Eyes: PERRL, conjunctiva/corneas clear, EOM's intact, fundi benign, both eyes      Ears: Normal TM's and external ear canals, both ears Throat: Lips, mucosa, and tongue normal; teeth and gums normal Neck: Supple, symmetrical, trachea midline, no adenopathy; thyroid: No enlargement/tenderness/nodules; no carotid bruit or JVD Back: Symmetric, no curvature, ROM normal, no CVA tenderness Lungs: Clear to auscultation bilaterally, respirations unlabored Heart: Regular rate and rhythm, S1 and S2 normal, no murmur, rub or gallop Abdomen: Soft, non-tender, bowel sounds active all four quadrants, no masses, no organomegaly Extremities: Extremities normal, atraumatic, no cyanosis or edema Pulses: 2+ and symmetric all extremities Skin: Skin color, texture, turgor normal, no rashes or lesions  NEUROLOGIC:   Mental status: Alert and oriented x4,  no aphasia, good attention span, fund of knowledge, and memory Motor Exam - grossly normal Sensory Exam - grossly normal Reflexes: 1+ Coordination - grossly normal Gait - grossly normal Balance - grossly normal Cranial Nerves: I: smell Not tested  II: visual acuity  OS: nl    OD: nl  II: visual fields Full to confrontation  II: pupils Equal, round, reactive to light  III,VII: ptosis None  III,IV,VI: extraocular muscles  Full ROM  V: mastication Normal  V: facial light touch sensation  Normal  V,VII: corneal reflex  Present  VII: facial muscle function - upper  Normal  VII: facial muscle function - lower Normal  VIII: hearing Not tested  IX:  soft palate elevation  Normal  IX,X: gag reflex Present  XI: trapezius strength  5/5  XI: sternocleidomastoid strength 5/5  XI: neck flexion strength  5/5  XII: tongue strength  Normal    Data Review Lab Results  Component Value Date   WBC 6.0 12/25/2012   HGB 13.1 12/25/2012   HCT 38.3 12/25/2012   MCV 86.5 12/25/2012   PLT 262 12/25/2012   Lab Results  Component Value Date   NA 141 12/25/2012   K 4.2 12/25/2012   CL 105 12/25/2012   CO2 25 12/25/2012   BUN 12 12/25/2012   CREATININE 0.72 12/25/2012   GLUCOSE 101* 12/25/2012   Lab Results  Component Value Date   INR 0.86 12/25/2012    Assessment/Plan: Patient admitted for PLIF L4-5. Patient has failed conservative therapy.  I explained the condition and procedure to the patient and answered any questions.  Patient wishes to proceed with procedure as planned. Understands risks/ benefits and typical outcomes of procedure.   Nisreen Guise S 01/01/2013 12:35 PM

## 2013-01-01 NOTE — Op Note (Signed)
01/01/2013  4:00 PM  PATIENT:  Gabrielle Duncan  57 y.o. female  PRE-OPERATIVE DIAGNOSIS:  Spondylolisthesis with stenosis L4-5, back and leg pain  POST-OPERATIVE DIAGNOSIS:  same  PROCEDURE:   1. Decompressive lumbar laminectomy L4-5 requiring more work than would be required of the typical PLIF procedure in order to adequately decompress the neural elements. The L4 and L5 nerve roots were involved with stenosis and had to be decompressed. 2. Posterior lumbar interbody fusion L4-5 using PEEK interbody cages packed with morcellized allograft and autograft  3. Posterior fixation L4-5 utilizing cortical pedicle screws.   SURGEON:  Marikay Alar, MD  ASSISTANTS: Dr. Newell Coral  ANESTHESIA:  General  EBL: 200 ml  Total I/O In: 2250 [I.V.:2000; IV Piggyback:250] Out: 300 [Urine:100; Blood:200]  BLOOD ADMINISTERED:none  DRAINS: Hemovac   INDICATION FOR PROCEDURE: This patient presented with a long history of back pain with radiation into the left leg. She had an MRI which showed a spondylolisthesis at L4-5 with spinal stenosis. She tried medical management without relief. Recommended a PLIF at L4-5 to address her spinal stenosis as well as segmental instability. Patient understood the risks, benefits, and alternatives and potential outcomes and wished to proceed.  PROCEDURE DETAILS:  The patient was brought to the operating room. After induction of generalized endotracheal anesthesia the patient was rolled into the prone position on chest rolls and all pressure points were padded. The patient's lumbar region was cleaned and then prepped with DuraPrep and draped in the usual sterile fashion. Anesthesia was injected and then a dorsal midline incision was made and carried down to the lumbosacral fascia. The fascia was opened and the paraspinous musculature was taken down in a subperiosteal fashion to expose L4-5. Intraoperative fluoroscopy confirmed my level, and I started with placement of the L4  pedicle screws. The pedicle screw entry zones were localized utilizing surface landmarks and AP and lateral fluoroscopy. EMG monitoring was used . I drilled and tapped in an upward and outward direction into the ankles of L4 utilizing AP and lateral fluoroscopy. I palpated with a ball probe. 50 by 30 mm pedicle screws were then placed and checked with AP and lateral fluoroscopy. I then turned my attention to the decompression and the spinous process was removed and complete lumbar laminectomies, hemi- facetectomies, and foraminotomies were performed at L4-5 decompress both the L4 and L5 nerve roots. The yellow ligament was removed to expose the underlying dura and nerve roots, and generous foraminotomies were performed to adequately decompress the neural elements. I palpated with a coronary dilator to assure adequate decompression of the L4 and L5 nerve roots. Once the decompression was complete, I turned my attention to the posterior lower lumbar interbody fusion. The epidural venous vasculature was coagulated and cut sharply. Disc space was incised and the initial discectomy was performed with pituitary rongeurs. The disc space was distracted with sequential distractors to a height of 12 mm. We then used a series of scrapers and shavers to prepare the endplates for fusion. The midline was prepared with Epstein curettes. Once the complete discectomy was finished, we packed an appropriate sized peek interbody cage with local autograft and morcellized allograft, gently retracted the nerve root, and tapped the cage into position at L4-5 laterally. The midline was packed with morselized autograft and allograft. We then turned our attention to the posterior fixation at L5. The pedicle screw entry zones were identified utilizing surface landmarks and fluoroscopy. We probed each pedicle with the pedicle probe and tapped each pedicle  with the appropriate tap. We palpated with a ball probe to assure no break in the cortex.  We then placed 50 by 30 mm pedicle screws into the pedicles bilaterally at L5. We then placed lordotic rods into the multiaxial screw heads of the pedicle screws and locked these in position with the locking caps and anti-torque device.  We then checked our construct with AP and lateral fluoroscopy. Irrigated with copious amounts of bacitracin-containing saline solution. Placed a medium Hemovac drain through separate stab incision. Inspected the nerve roots once again to assure adequate decompression, lined to the dura with Gelfoam, and closed the muscle and the fascia with 0 Vicryl. Closed the subcutaneous tissues with 2-0 Vicryl and subcuticular tissues with 3-0 Vicryl. The skin was closed with benzoin and Steri-Strips. Dressing was then applied, the patient was awakened from general anesthesia and transported to the recovery room in stable condition. At the end of the procedure all sponge, needle and instrument counts were correct.   PLAN OF CARE: Admit to inpatient   PATIENT DISPOSITION:  PACU - hemodynamically stable.   Delay start of Pharmacological VTE agent (>24hrs) due to surgical blood loss or risk of bleeding:  yes

## 2013-01-01 NOTE — Anesthesia Preprocedure Evaluation (Addendum)
Anesthesia Evaluation  Patient identified by MRN, date of birth, ID band Patient awake    Reviewed: Allergy & Precautions, H&P , NPO status , Patient's Chart, lab work & pertinent test results  Airway Mallampati: I TM Distance: >3 FB Neck ROM: Full    Dental  (+) Dental Advisory Given and Caps   Pulmonary  breath sounds clear to auscultation        Cardiovascular Rhythm:Regular Rate:Normal     Neuro/Psych    GI/Hepatic   Endo/Other    Renal/GU      Musculoskeletal   Abdominal   Peds  Hematology   Anesthesia Other Findings   Reproductive/Obstetrics                          Anesthesia Physical Anesthesia Plan  ASA: I  Anesthesia Plan: General   Post-op Pain Management:    Induction: Intravenous  Airway Management Planned: Oral ETT  Additional Equipment:   Intra-op Plan:   Post-operative Plan: Extubation in OR  Informed Consent: I have reviewed the patients History and Physical, chart, labs and discussed the procedure including the risks, benefits and alternatives for the proposed anesthesia with the patient or authorized representative who has indicated his/her understanding and acceptance.     Plan Discussed with: CRNA and Surgeon  Anesthesia Plan Comments:         Anesthesia Quick Evaluation

## 2013-01-01 NOTE — Anesthesia Postprocedure Evaluation (Signed)
Anesthesia Post Note  Patient: Gabrielle Duncan  Procedure(s) Performed: Procedure(s) (LRB): MAXIMUM ACCESS (MAS) POSTERIOR LUMBAR INTERBODY FUSION (PLIF) 1 LEVEL (N/A)  Anesthesia type: general  Patient location: PACU  Post pain: Pain level controlled  Post assessment: Patient's Cardiovascular Status Stable  Last Vitals:  Filed Vitals:   01/01/13 1700  BP: 105/72  Pulse: 79  Temp:   Resp: 16    Post vital signs: Reviewed and stable  Level of consciousness: sedated  Complications: No apparent anesthesia complications

## 2013-01-01 NOTE — Transfer of Care (Signed)
Immediate Anesthesia Transfer of Care Note  Patient: Gabrielle Duncan  Procedure(s) Performed: Procedure(s) with comments: MAXIMUM ACCESS (MAS) POSTERIOR LUMBAR INTERBODY FUSION (PLIF) 1 LEVEL (N/A) - Lumbat four - five  Patient Location: PACU  Anesthesia Type:General  Level of Consciousness: responds to stimulation  Airway & Oxygen Therapy: Patient Spontanous Breathing and Patient connected to nasal cannula oxygen  Post-op Assessment: Report given to PACU RN, Post -op Vital signs reviewed and stable and Patient moving all extremities  Post vital signs: Reviewed and stable  Complications: No apparent anesthesia complications

## 2013-01-01 NOTE — Plan of Care (Signed)
Problem: Consults Goal: Diagnosis - Spinal Surgery Outcome: Completed/Met Date Met:  01/01/13 Thoraco/Lumbar Spine Fusion

## 2013-01-01 NOTE — Anesthesia Procedure Notes (Signed)
Procedure Name: Intubation Date/Time: 01/01/2013 1:07 PM Performed by: Luster Landsberg Pre-anesthesia Checklist: Patient identified, Emergency Drugs available, Suction available and Patient being monitored Patient Re-evaluated:Patient Re-evaluated prior to inductionOxygen Delivery Method: Circle system utilized Preoxygenation: Pre-oxygenation with 100% oxygen Intubation Type: IV induction Ventilation: Mask ventilation without difficulty and Oral airway inserted - appropriate to patient size Laryngoscope Size: Mac and 3 Grade View: Grade I Tube type: Oral Tube size: 7.0 mm Number of attempts: 1 Airway Equipment and Method: Stylet Placement Confirmation: ETT inserted through vocal cords under direct vision,  positive ETCO2 and breath sounds checked- equal and bilateral Secured at: 21 cm Tube secured with: Tape Dental Injury: Teeth and Oropharynx as per pre-operative assessment

## 2013-01-02 NOTE — Progress Notes (Signed)
UR COMPLETED  

## 2013-01-02 NOTE — Evaluation (Signed)
Occupational Therapy Evaluation Patient Details Name: Gabrielle Duncan MRN: [id number] DOB: [birth year] Today's Date: 01/02/2013 Time: 8657-8469 OT Time Calculation (min): 39 min  OT Assessment / Plan / Recommendation Clinical Impression  Pt s/p lumbar fusion.  Educated pt on ADL techniques and back precautions.  Pt very appreciative of information and is now at mod I level. Education completed. Signing off.    OT Assessment  Patient does not need any further OT services    Follow Up Recommendations  No OT follow up    Barriers to Discharge      Equipment Recommendations  None recommended by OT    Recommendations for Other Services    Frequency       Precautions / Restrictions Precautions Precautions: Back Required Braces or Orthoses: Spinal Brace Spinal Brace: Lumbar corset;Applied in sitting position   Pertinent Vitals/Pain See vitals    ADL  Eating/Feeding: Performed;Independent Where Assessed - Eating/Feeding: Chair Grooming: Performed;Wash/dry hands;Brushing hair;Modified independent Where Assessed - Grooming: Unsupported standing Upper Body Bathing: Simulated;Modified independent Where Assessed - Upper Body Bathing: Unsupported sitting Lower Body Bathing: Simulated;Modified independent Where Assessed - Lower Body Bathing: Unsupported sit to stand Upper Body Dressing: Simulated;Modified independent Where Assessed - Upper Body Dressing: Unsupported sitting Lower Body Dressing: Performed;Modified independent Where Assessed - Lower Body Dressing: Unsupported sit to stand Toilet Transfer: Performed;Modified independent Toilet Transfer Method: Sit to Barista: Regular height toilet Toileting - Clothing Manipulation and Hygiene: Performed;Modified independent Where Assessed - Toileting Clothing Manipulation and Hygiene: Sit to stand from 3-in-1 or toilet Tub/Shower Transfer: Performed;Modified independent Tub/Shower Transfer Method:  Science writer: Walk in shower Equipment Used: Back brace Transfers/Ambulation Related to ADLs: mod I ADL Comments: Reviewed back handout with pt. Pt with questions regarding bathing/dressing. Recommended use of walk in shower rather than tub/shower (pt has both) for safety.  Pt able to cross ankles over knees for LB tasks.      OT Diagnosis:    OT Problem List:   OT Treatment Interventions:     OT Goals    Visit Information  Last OT Received On: 01/02/13 Assistance Needed: +1    Subjective Data      Prior Functioning     Home Living Lives With: Significant other Available Help at Discharge: Family Type of Home: House Home Access: Stairs to enter Secretary/administrator of Steps: 1 Entrance Stairs-Rails: None Home Layout: One level Bathroom Shower/Tub: Health visitor: Standard Home Adaptive Equipment: Clinical research associate - four wheeled Prior Function Level of Independence: Independent Able to Take Stairs?: Yes Driving: Yes Vocation: Full time employment Communication Communication: No difficulties         Vision/Perception     Cognition  Cognition Overall Cognitive Status: Appears within functional limits for tasks assessed/performed Arousal/Alertness: Awake/alert Orientation Level: Appears intact for tasks assessed Behavior During Session: [facility] for tasks performed    Extremity/Trunk Assessment Right Upper Extremity Assessment RUE ROM/Strength/Tone: [facility] for tasks assessed Left Upper Extremity Assessment LUE ROM/Strength/Tone: WFL for tasks assessed Right Lower Extremity Assessment RLE ROM/Strength/Tone: WFL for tasks assessed RLE Sensation: Deficits RLE Sensation Deficits: reports numbness in anterior thighs Left Lower Extremity Assessment LLE ROM/Strength/Tone: WFL for tasks assessed LLE Sensation: Deficits LLE Sensation Deficits: reports numbness in anterior thighs     Mobility Bed Mobility Bed  Mobility: Rolling Left;Left Sidelying to Sit;Sitting - Scoot to Edge of Bed Rolling Left: 6: Modified independent (Device/Increase time)  Sitting - Scoot to Edge of Bed: 6: Modified independent (Device/Increase  time) Details for Bed Mobility Assistance: Incr time Transfers Transfers: Sit to Stand;Stand to Sit Sit to Stand: 6: Modified independent (Device/Increase time);From bed;From chair/3-in-1 Stand to Sit: 6: Modified independent (Device/Increase time);Without upper extremity assist;To bed;To chair/3-in-1 Details for Transfer Assistance: Incr time     Exercise     Balance Balance Balance Assessed: Yes Static Standing Balance Static Standing - Balance Support: No upper extremity supported Static Standing - Level of Assistance: 7: Independent Dynamic Standing Balance Dynamic Standing - Balance Support: No upper extremity supported Dynamic Standing - Level of Assistance: 7: Independent   End of Session OT - End of Session Equipment Utilized During Treatment: Back brace Activity Tolerance: Patient tolerated treatment well Patient left: in chair;with call bell/phone within reach Nurse Communication: Mobility status  GO    01/02/2013 Cipriano Mile OTR/L Pager [phone] Office [phone]  Cipriano Mile 01/02/2013, 12:49 PM

## 2013-01-02 NOTE — Evaluation (Signed)
Physical Therapy Evaluation Patient Details Name: Gabrielle Duncan MRN: [id number] DOB: 10/12/1955 Today's Date: 01/02/2013 Time: 4098-1191 PT Time Calculation (min): 30 min  PT Assessment / Plan / Recommendation Clinical Impression  Pt s/p 1 level lumbar fusion.  Pt doing very well with mobility.  Reviewed back precautions and answered multiple questions from pt.  Pt demonstrated and verbalized good understanding of mobility and precautions.  Instructed pt to continue walking at home.  Pt asked if she could perform minisquats holding onto counter at home.  I told her she could as long as she kept her back straight which she demonstrated.    PT Assessment  Patent does not need any further PT services    Follow Up Recommendations  No PT follow up    Does the patient have the potential to tolerate intense rehabilitation      Barriers to Discharge        Equipment Recommendations  None recommended by PT    Recommendations for Other Services     Frequency      Precautions / Restrictions Precautions Precautions: Back Required Braces or Orthoses: Spinal Brace Spinal Brace: Lumbar corset;Applied in sitting position Restrictions Weight Bearing Restrictions: No   Pertinent Vitals/Pain Back and thigh pain.      Mobility  Bed Mobility Bed Mobility: Rolling Left;Left Sidelying to Sit;Sitting - Scoot to Edge of Bed Rolling Left: 6: Modified independent (Device/Increase time) Left Sidelying to Sit: 6: Modified independent (Device/Increase time);HOB flat Sitting - Scoot to Edge of Bed: 6: Modified independent (Device/Increase time) Details for Bed Mobility Assistance: Incr time Transfers Transfers: Sit to Stand;Stand to Sit Sit to Stand: 6: Modified independent (Device/Increase time);Without upper extremity assist;From bed Stand to Sit: 6: Modified independent (Device/Increase time);Without upper extremity assist;To bed Details for Transfer Assistance: Incr  time Ambulation/Gait Ambulation/Gait Assistance: 7: Independent Ambulation Distance (Feet): 800 Feet Assistive device: None Gait Pattern: Within Functional Limits Stairs: Yes Stairs Assistance: 7: Independent Stair Management Technique: No rails Number of Stairs: 1 (1 x 6)    Exercises     PT Diagnosis:    PT Problem List:   PT Treatment Interventions:     PT Goals    Visit Information  Last PT Received On: 01/02/13 Assistance Needed: +1    Subjective Data  Subjective: Pt states she has had 4 surgeries since July. Patient Stated Goal: return home   Prior Functioning  Home Living Lives With: Significant other Available Help at Discharge: Family Type of Home: House Home Access: Stairs to enter Secretary/administrator of Steps: 1 Entrance Stairs-Rails: None Home Layout: One level Bathroom Shower/Tub: Health visitor: Standard Home Adaptive Equipment: Clinical research associate - four wheeled Prior Function Level of Independence: Independent Able to Take Stairs?: Yes Driving: Yes Vocation: Full time employment Communication Communication: No difficulties    Cognition  Cognition Overall Cognitive Status: Appears within functional limits for tasks assessed/performed Arousal/Alertness: Awake/alert Orientation Level: Appears intact for tasks assessed Behavior During Session: [facility] for tasks performed    Extremity/Trunk Assessment Right Lower Extremity Assessment RLE ROM/Strength/Tone: [facility] for tasks assessed RLE Sensation: Deficits RLE Sensation Deficits: reports numbness in anterior thighs Left Lower Extremity Assessment LLE ROM/Strength/Tone: WFL for tasks assessed LLE Sensation: Deficits LLE Sensation Deficits: reports numbness in anterior thighs   Balance Balance Balance Assessed: Yes Static Standing Balance Static Standing - Balance Support: No upper extremity supported Static Standing - Level of Assistance: 7: Independent Dynamic Standing  Balance Dynamic Standing - Balance Support: No upper extremity  supported Dynamic Standing - Level of Assistance: 7: Independent  End of Session PT - End of Session Equipment Utilized During Treatment: Back brace Activity Tolerance: Patient tolerated treatment well Patient left: Other (comment) (standing in room) Nurse Communication: Mobility status  GP     [facility] 01/02/2013, 10:34 AM  Skip Mayer PT [phone]

## 2013-01-02 NOTE — Progress Notes (Signed)
Patient ID: SHERI GATCHEL, female   DOB: [birth year], 57 y.o.   MRN: [id number] Subjective: Patient reports she's doing well. She has appropriate postoperative back soreness. No leg pain or numbness tingling or weakness  Objective: Vital signs in last 24 hours: Temp:  [97.7 F (36.5 C)-98.5 F (36.9 C)] 97.7 F (36.5 C) (04/03 0400) Pulse Rate:  [66-98] 70 (04/03 0400) Resp:  [12-22] 18 (04/03 0400) BP: (99-124)/(66-79) 114/69 mmHg (04/03 0400) SpO2:  [95 %-100 %] 99 % (04/03 0400)  Intake/Output from previous day: 04/02 0701 - 04/03 0700 In: 2490 [P.O.:240; I.V.:2000; IV Piggyback:250] Out: 2150 [Urine:1850; Drains:100; Blood:200] Intake/Output this shift:    Neurologic: Grossly normal  Lab Results: Lab Results  Component Value Date   WBC 6.0 12/25/2012   HGB 13.1 12/25/2012   HCT 38.3 12/25/2012   MCV 86.5 12/25/2012   PLT 262 12/25/2012   Lab Results  Component Value Date   INR 0.86 12/25/2012   BMET Lab Results  Component Value Date   NA 141 12/25/2012   K 4.2 12/25/2012   CL 105 12/25/2012   CO2 25 12/25/2012   GLUCOSE 101* 12/25/2012   BUN 12 12/25/2012   CREATININE 0.72 12/25/2012   CALCIUM 9.3 12/25/2012    Studies/Results: Dg Lumbar Spine 2-3 Views  01/01/2013  *RADIOLOGY REPORT*  Clinical Data: Back pain  DG C-ARM 1-60 MIN,LUMBAR SPINE - 2-3 VIEW  Comparison: None.  Findings: C-arm films document placement of pedicle screws and interbody cages  in preparation for L4-5 fusion.  IMPRESSION: As above.   Original Report Authenticated By: Davonna Belling, M.D.    Dg C-arm 1-60 Min  01/01/2013  *RADIOLOGY REPORT*  Clinical Data: Back pain  DG C-ARM 1-60 MIN,LUMBAR SPINE - 2-3 VIEW  Comparison: None.  Findings: C-arm films document placement of pedicle screws and interbody cages  in preparation for L4-5 fusion.  IMPRESSION: As above.   Original Report Authenticated By: Davonna Belling, M.D.     Assessment/Plan: Doing well. PT OT. Home tomorrow   LOS: 1 day    Roselynn Whitacre  S 01/02/2013, 7:59 AM

## 2013-01-03 NOTE — Discharge Summary (Signed)
Physician Discharge Summary  Patient ID: Gabrielle Duncan MRN: [id number] DOB/AGE: 06-26-1956 57 y.o.  Admit date: 01/01/2013 Discharge date: 01/03/2013  Admission Diagnoses: spondylolisthesis L4-5   Discharge Diagnoses: same   Discharged Condition: good  Hospital Course: The patient was admitted on 01/01/2013 and taken to the operating room where the patient underwent PLIF L4-5. The patient tolerated the procedure well and was taken to the recovery room and then to the floor in stable condition. The hospital course was routine. There were no complications. The wound remained clean dry and intact. Pt had appropriate back soreness. No complaints of leg pain or new N/T/W. The patient remained afebrile with stable vital signs, and tolerated a regular diet. The patient continued to increase activities, and pain was well controlled with oral pain medications.   Consults: none  Significant Diagnostic Studies:  Results for orders placed during the hospital encounter of 12/25/12  SURGICAL PCR SCREEN      Result Value Range   MRSA, PCR NEGATIVE  NEGATIVE   Staphylococcus aureus POSITIVE (*) NEGATIVE  BASIC METABOLIC PANEL      Result Value Range   Sodium 141  135 - 145 mEq/L   Potassium 4.2  3.5 - 5.1 mEq/L   Chloride 105  96 - 112 mEq/L   CO2 25  19 - 32 mEq/L   Glucose, Bld 101 (*) 70 - 99 mg/dL   BUN 12  6 - 23 mg/dL   Creatinine, Ser 9.52  0.50 - 1.10 mg/dL   Calcium 9.3  8.4 - 84.1 mg/dL   GFR calc non Af Amer >90  >90 mL/min   GFR calc Af Amer >90  >90 mL/min  CBC WITH DIFFERENTIAL      Result Value Range   WBC 6.0  4.0 - 10.5 K/uL   RBC 4.43  3.87 - 5.11 MIL/uL   Hemoglobin 13.1  12.0 - 15.0 g/dL   HCT 32.4  40.1 - 02.7 %   MCV 86.5  78.0 - 100.0 fL   MCH 29.6  26.0 - 34.0 pg   MCHC 34.2  30.0 - 36.0 g/dL   RDW 25.3  66.4 - 40.3 %   Platelets 262  150 - 400 K/uL   Neutrophils Relative 51  43 - 77 %   Neutro Abs 3.1  1.7 - 7.7 K/uL   Lymphocytes Relative 35  12 - 46 %   Lymphs Abs 2.1  0.7 - 4.0 K/uL   Monocytes Relative 7  3 - 12 %   Monocytes Absolute 0.4  0.1 - 1.0 K/uL   Eosinophils Relative 6 (*) 0 - 5 %   Eosinophils Absolute 0.4  0.0 - 0.7 K/uL   Basophils Relative 1  0 - 1 %   Basophils Absolute 0.0  0.0 - 0.1 K/uL  PROTIME-INR      Result Value Range   Prothrombin Time 11.7  11.6 - 15.2 seconds   INR 0.86  0.00 - 1.49  TYPE AND SCREEN      Result Value Range   ABO/RH(D) O POS     Antibody Screen NEG     Sample Expiration 01/08/2013    ABO/RH      Result Value Range   ABO/RH(D) O POS      Dg Lumbar Spine 2-3 Views  01/01/2013  *RADIOLOGY REPORT*  Clinical Data: Back pain  DG C-ARM 1-60 MIN,LUMBAR SPINE - 2-3 VIEW  Comparison: None.  Findings: C-arm films document placement of pedicle screws and interbody  cages  in preparation for L4-5 fusion.  IMPRESSION: As above.   Original Report Authenticated By: Davonna Belling, M.D.    Dg C-arm 1-60 Min  01/01/2013  *RADIOLOGY REPORT*  Clinical Data: Back pain  DG C-ARM 1-60 MIN,LUMBAR SPINE - 2-3 VIEW  Comparison: None.  Findings: C-arm films document placement of pedicle screws and interbody cages  in preparation for L4-5 fusion.  IMPRESSION: As above.   Original Report Authenticated By: Davonna Belling, M.D.     Antibiotics:  Anti-infectives   Start     Dose/Rate Route Frequency Ordered Stop   01/01/13 2100  ceFAZolin (ANCEF) IVPB 1 g/50 mL premix     1 g 100 mL/hr over 30 Minutes Intravenous Every 8 hours 01/01/13 1734 01/02/13 0504   01/01/13 1400  bacitracin 50,000 Units in sodium chloride irrigation 0.9 % 500 mL irrigation  Status:  Discontinued       As needed 01/01/13 1400 01/01/13 1544   01/01/13 1243  bacitracin 36644 UNITS injection    Comments:  STEELMAN, CRAIG: cabinet override      01/01/13 1243 01/02/13 0059   01/01/13 0600  ceFAZolin (ANCEF) IVPB 2 g/50 mL premix     2 g 100 mL/hr over 30 Minutes Intravenous On call to O.R. 12/31/12 1420 01/01/13 1309      Discharge Exam: Blood  pressure 105/70, pulse 70, temperature 98.1 F (36.7 C), temperature source Oral, resp. rate 18, SpO2 96.00%. Neuro normal, good strength Incision CDI  Discharge Medications:     Medication List    ASK your doctor about these medications       ibuprofen 200 MG tablet  Commonly known as:  ADVIL,MOTRIN  Take 400 mg by mouth every 6 (six) hours as needed for pain.     loratadine 10 MG tablet  Commonly known as:  CLARITIN  Take 10 mg by mouth daily as needed for allergies.        Disposition: home   Final Dx: plif L4-5       Future Appointments Provider Department Dept Phone   01/10/2013 10:30 AM Almond Lint, MD [facility], [place] [phone]      Follow-up Information   Follow up with [facility] S, MD. Schedule an appointment as soon as possible for a visit in 2 weeks.   Contact information:   [address] [place] [address] [phone]        Signed: Tia Alert 01/03/2013, 6:37 AM

## 2013-01-03 NOTE — Progress Notes (Signed)
Pt given D/C instructions with Rx's, verbal understanding given. Pt D/C'd home via wheelchair per MD order. Francys Bolin, RN 

## 2013-01-10 ENCOUNTER — Telehealth (INDEPENDENT_AMBULATORY_CARE_PROVIDER_SITE_OTHER): Payer: Self-pay | Admitting: General Surgery

## 2013-01-10 ENCOUNTER — Other Ambulatory Visit (INDEPENDENT_AMBULATORY_CARE_PROVIDER_SITE_OTHER): Payer: Self-pay | Admitting: General Surgery

## 2013-01-10 ENCOUNTER — Ambulatory Visit (INDEPENDENT_AMBULATORY_CARE_PROVIDER_SITE_OTHER): Payer: BC Managed Care – PPO | Admitting: General Surgery

## 2013-01-10 ENCOUNTER — Encounter (INDEPENDENT_AMBULATORY_CARE_PROVIDER_SITE_OTHER): Payer: Self-pay | Admitting: General Surgery

## 2013-01-10 VITALS — BP 110/70 | HR 72 | Resp 18 | Ht 66.5 in | Wt 155.0 lb

## 2013-01-10 DIAGNOSIS — D1739 Benign lipomatous neoplasm of skin and subcutaneous tissue of other sites: Secondary | ICD-10-CM

## 2013-01-10 DIAGNOSIS — R221 Localized swelling, mass and lump, neck: Secondary | ICD-10-CM

## 2013-01-10 DIAGNOSIS — R222 Localized swelling, mass and lump, trunk: Secondary | ICD-10-CM

## 2013-01-10 DIAGNOSIS — D171 Benign lipomatous neoplasm of skin and subcutaneous tissue of trunk: Secondary | ICD-10-CM

## 2013-01-10 DIAGNOSIS — R220 Localized swelling, mass and lump, head: Secondary | ICD-10-CM

## 2013-01-10 NOTE — Patient Instructions (Signed)
Get mammogram and ultrasound of neck.    Will schedule surgery as outpatient.  Main risks are bleeding, infection, wound issues such as hematoma, seroma, abnormal contour, pain, damage to adjacent structures.  You will have around 1-2 weeks activity restrictions.    I will see you back around 3 weeks post operatively.

## 2013-01-10 NOTE — Telephone Encounter (Signed)
Spoke with patient she is aware of [place] head adn neck 01/15/13@230pm  [address] . And MM and bi lat breast u/s 01/23/13 1015am. She was also ask to pick up breast films take  to breast center

## 2013-01-13 DIAGNOSIS — D171 Benign lipomatous neoplasm of skin and subcutaneous tissue of trunk: Secondary | ICD-10-CM | POA: Insufficient documentation

## 2013-01-13 NOTE — Progress Notes (Signed)
Patient ID: Gabrielle Duncan, female   DOB: 1956/09/11, 57 y.o.   MRN: [id number]  Chief Complaint  Patient presents with  . Other    Eval fatty mass on chest    HPI Gabrielle Duncan is a 57 y.o. female.   HPI Pt is a 57 yo female who is overall very healthy except for bad osteoarthritis who presents with a growth over her sternum around a year.  It started very small.  It has continued to grow.  Sometimes the skin over the top gets very red and tender.  She has had imaging in this region which did not demonstrate the mass, but did rule out several things.  She has had shoulder surgery and neck surgery in the last year, and is concerned that the neck swelling may reflect extension of the mass into the neck.    Past Medical History  Diagnosis Date  . Arthritis   . Eczema   . Osteoarthritis of right shoulder region 09/24/2012    Past Surgical History  Procedure Laterality Date  . Abdominal hysterectomy  1997  . Cholecystectomy    . Shoulder surgery      debridement and partial clavical removal  . Cervical fusion  2005  . Anterior cervical decomp/discectomy fusion  08/08/2012    Procedure: ANTERIOR CERVICAL DECOMPRESSION/DISCECTOMY FUSION 2 LEVEL/HARDWARE REMOVAL;  Surgeon: Tia Alert, MD;  Location: [facility];  Service: Neurosurgery;  Laterality: N/A;  anterior cervical five-six to six seven decompression fusion with removal plate four-five  . Total shoulder arthroplasty  09/24/2012    Procedure: TOTAL SHOULDER ARTHROPLASTY;  Surgeon: Eulas Post, MD;  Location: [facility];  Service: Orthopedics;  Laterality: Right;  . Eye surgery      for dry eye syndrome    History reviewed. No pertinent family history.  Social History History  Substance Use Topics  . Smoking status: Never Smoker   . Smokeless tobacco: Never Used  . Alcohol Use: No    No Known Allergies  Current Outpatient Prescriptions  Medication Sig Dispense Refill  . loratadine (CLARITIN) 10 MG tablet Take 10 mg by  mouth daily as needed for allergies.        No current facility-administered medications for this visit.    Review of Systems Review of Systems  Musculoskeletal: Positive for arthralgias.  Skin:       Chest wall mass 8x6 cm  Hematological: Bruises/bleeds easily.    Blood pressure 110/70, pulse 72, resp. rate 18, height 5' 6.5" (1.689 m), weight 155 lb (70.308 kg).  Physical Exam Physical Exam  Constitutional: She is oriented to person, place, and time. She appears well-developed and well-nourished. No distress.  HENT:  Head: Normocephalic and atraumatic.  Right Ear: External ear normal.  Left Ear: External ear normal.  Eyes: Conjunctivae are normal. Pupils are equal, round, and reactive to light. No scleral icterus.  Neck: Normal range of motion. Neck supple. No tracheal deviation present. No thyromegaly present.  Right neck diffusely larger than left.  No palpable mass or thyromegaly  No profound JVD.    Cardiovascular: Normal rate, regular rhythm, normal heart sounds and intact distal pulses.   Pulmonary/Chest: Effort normal and breath sounds normal. No respiratory distress. She has no wheezes. She exhibits mass. She exhibits no tenderness.    8x6 cm mass  Abdominal: Soft. Bowel sounds are normal. She exhibits no distension. There is no tenderness.  Musculoskeletal: She exhibits no edema.  Antalgic gait, using cane  Lymphadenopathy:    She has no cervical adenopathy.  Neurological: She is alert and oriented to person, place, and time. Coordination normal.  Skin: Skin is warm and dry. No rash noted. She is not diaphoretic. No erythema. No pallor.  Psychiatric: She has a normal mood and affect. Her behavior is normal. Judgment and thought content normal.  Very gregarious    Data Reviewed n/a  Assessment/Plan    Right neck swelling. Pt's right neck swelling concerning for DVT or central venous occlusion.  Cannot palpate discrete mass.    Will get ultrasound of neck  to evaluate.    Lipoma of skin and subcutaneous tissue of trunk Pt mass is likely lipoma. Pt is due for mammogram.  Will go ahead and procure mammo before surgery.    Think unlikely to be revealing.  However, pt very behind on screening.  Will plan excision of fatty mass.  Reviewed risks and benefits including bleeding, infection, damage to adjacent structures, divot, wound complications.  Reviewed lifting and activity restrictions.        Chevella Pearce 01/13/2013, 9:45 PM

## 2013-01-13 NOTE — Assessment & Plan Note (Signed)
Pt's right neck swelling concerning for DVT or central venous occlusion.  Cannot palpate discrete mass.    Will get ultrasound of neck to evaluate.

## 2013-01-13 NOTE — Assessment & Plan Note (Signed)
Pt mass is likely lipoma. Pt is due for mammogram.  Will go ahead and procure mammo before surgery.    Think unlikely to be revealing.  However, pt very behind on screening.  Will plan excision of fatty mass.  Reviewed risks and benefits including bleeding, infection, damage to adjacent structures, divot, wound complications.  Reviewed lifting and activity restrictions.

## 2013-01-15 ENCOUNTER — Other Ambulatory Visit: Payer: BC Managed Care – PPO

## 2013-01-20 ENCOUNTER — Telehealth (INDEPENDENT_AMBULATORY_CARE_PROVIDER_SITE_OTHER): Payer: Self-pay | Admitting: *Deleted

## 2013-01-20 ENCOUNTER — Other Ambulatory Visit (INDEPENDENT_AMBULATORY_CARE_PROVIDER_SITE_OTHER): Payer: Self-pay | Admitting: *Deleted

## 2013-01-20 ENCOUNTER — Ambulatory Visit
Admission: RE | Admit: 2013-01-20 | Discharge: 2013-01-20 | Disposition: A | Discharge status: Home / Self Care | Payer: BC Managed Care – PPO | Source: Ambulatory Visit | Attending: General Surgery | Admitting: General Surgery

## 2013-01-20 DIAGNOSIS — R221 Localized swelling, mass and lump, neck: Secondary | ICD-10-CM

## 2013-01-20 NOTE — Telephone Encounter (Signed)
Gabrielle Duncan with [facility] called to get a correction of the [place] order.  States that to get a venous US a vascular order has to be entered.  Order entered at this time.

## 2013-01-21 ENCOUNTER — Telehealth (INDEPENDENT_AMBULATORY_CARE_PROVIDER_SITE_OTHER): Payer: Self-pay | Admitting: General Surgery

## 2013-01-21 NOTE — Telephone Encounter (Signed)
I left message with patient on mobile phone for  Her appt  01/22/13 at 9am [address]

## 2013-01-21 NOTE — Telephone Encounter (Signed)
The [facility] is aware of the issue with this patient  And her work they have put on the waiting list  In hope to have this test done before  Her surgery on 01/29/13

## 2013-01-22 ENCOUNTER — Other Ambulatory Visit: Payer: BC Managed Care – PPO

## 2013-01-22 ENCOUNTER — Ambulatory Visit
Admission: RE | Admit: 2013-01-22 | Discharge: 2013-01-22 | Disposition: A | Discharge status: Home / Self Care | Payer: BC Managed Care – PPO | Source: Ambulatory Visit | Attending: General Surgery | Admitting: General Surgery

## 2013-01-22 DIAGNOSIS — R221 Localized swelling, mass and lump, neck: Secondary | ICD-10-CM

## 2013-01-23 ENCOUNTER — Other Ambulatory Visit (INDEPENDENT_AMBULATORY_CARE_PROVIDER_SITE_OTHER): Payer: Self-pay | Admitting: General Surgery

## 2013-01-23 ENCOUNTER — Encounter (HOSPITAL_BASED_OUTPATIENT_CLINIC_OR_DEPARTMENT_OTHER): Payer: Self-pay | Admitting: *Deleted

## 2013-01-23 ENCOUNTER — Other Ambulatory Visit: Payer: BC Managed Care – PPO

## 2013-01-23 ENCOUNTER — Ambulatory Visit
Admission: RE | Admit: 2013-01-23 | Discharge: 2013-01-23 | Disposition: A | Discharge status: Home / Self Care | Payer: BC Managed Care – PPO | Source: Ambulatory Visit | Attending: General Surgery | Admitting: General Surgery

## 2013-01-23 DIAGNOSIS — R222 Localized swelling, mass and lump, trunk: Secondary | ICD-10-CM

## 2013-01-29 ENCOUNTER — Encounter (HOSPITAL_BASED_OUTPATIENT_CLINIC_OR_DEPARTMENT_OTHER): Payer: Self-pay | Admitting: Anesthesiology

## 2013-01-29 ENCOUNTER — Encounter (HOSPITAL_BASED_OUTPATIENT_CLINIC_OR_DEPARTMENT_OTHER): Payer: Self-pay | Admitting: *Deleted

## 2013-01-29 ENCOUNTER — Ambulatory Visit (HOSPITAL_BASED_OUTPATIENT_CLINIC_OR_DEPARTMENT_OTHER)
Admission: RE | Admit: 2013-01-29 | Discharge: 2013-01-29 | Disposition: A | Discharge status: Home / Self Care | Payer: BC Managed Care – PPO | Source: Ambulatory Visit | Attending: General Surgery | Admitting: General Surgery

## 2013-01-29 ENCOUNTER — Encounter (HOSPITAL_BASED_OUTPATIENT_CLINIC_OR_DEPARTMENT_OTHER)
Admission: RE | Disposition: A | Discharge status: Home / Self Care | Payer: Self-pay | Source: Ambulatory Visit | Attending: General Surgery

## 2013-01-29 ENCOUNTER — Ambulatory Visit (HOSPITAL_BASED_OUTPATIENT_CLINIC_OR_DEPARTMENT_OTHER): Payer: BC Managed Care – PPO | Admitting: Anesthesiology

## 2013-01-29 DIAGNOSIS — D1739 Benign lipomatous neoplasm of skin and subcutaneous tissue of other sites: Secondary | ICD-10-CM

## 2013-01-29 DIAGNOSIS — R222 Localized swelling, mass and lump, trunk: Secondary | ICD-10-CM | POA: Insufficient documentation

## 2013-01-29 DIAGNOSIS — M19019 Primary osteoarthritis, unspecified shoulder: Secondary | ICD-10-CM | POA: Insufficient documentation

## 2013-01-29 DIAGNOSIS — L259 Unspecified contact dermatitis, unspecified cause: Secondary | ICD-10-CM | POA: Insufficient documentation

## 2013-01-29 HISTORY — DX: Unspecified osteoarthritis, unspecified site: M19.90

## 2013-01-29 HISTORY — PX: MASS EXCISION: SHX2000

## 2013-01-29 HISTORY — DX: Other seasonal allergic rhinitis: J30.2

## 2013-01-29 HISTORY — DX: Localized swelling, mass and lump, trunk: R22.2

## 2013-01-29 LAB — POCT HEMOGLOBIN-HEMACUE: Hemoglobin: 10.6 g/dL — ABNORMAL LOW (ref 12.0–15.0)

## 2013-01-29 SURGERY — EXCISION MASS
Anesthesia: General | Site: Chest | Wound class: Clean

## 2013-01-29 MED ORDER — BUPIVACAINE HCL (PF) 0.25 % IJ SOLN
INTRAMUSCULAR | Status: DC | PRN
  Administered 2013-01-29: 10 mL

## 2013-01-29 MED ORDER — PROMETHAZINE HCL 25 MG/ML IJ SOLN
6.2500 mg | Freq: Once | INTRAMUSCULAR | Status: AC | PRN
  Administered 2013-01-29: 6.25 mg via INTRAVENOUS

## 2013-01-29 MED ORDER — OXYCODONE HCL 5 MG/5ML PO SOLN: 5.0000 mg | Freq: Once | ORAL | Status: DC | PRN

## 2013-01-29 MED ORDER — DEXAMETHASONE SODIUM PHOSPHATE 4 MG/ML IJ SOLN
INTRAMUSCULAR | Status: DC | PRN
  Administered 2013-01-29: 8 mg via INTRAVENOUS

## 2013-01-29 MED ORDER — LIDOCAINE-EPINEPHRINE (PF) 1 %-1:200000 IJ SOLN
INTRAMUSCULAR | Status: DC | PRN
  Administered 2013-01-29: 10 mL

## 2013-01-29 MED ORDER — ONDANSETRON HCL 4 MG/2ML IJ SOLN: 4.0000 mg | Freq: Once | INTRAMUSCULAR | Status: DC | PRN

## 2013-01-29 MED ORDER — OXYCODONE HCL 5 MG PO TABS: 5.0000 mg | ORAL_TABLET | Freq: Once | ORAL | Status: DC | PRN

## 2013-01-29 MED ORDER — MIDAZOLAM HCL 2 MG/ML PO SYRP: 12.0000 mg | ORAL_SOLUTION | Freq: Once | ORAL | Status: DC | PRN

## 2013-01-29 MED ORDER — HYDROMORPHONE HCL PF 1 MG/ML IJ SOLN
0.2500 mg | INTRAMUSCULAR | Status: DC | PRN
  Administered 2013-01-29: 0.5 mg via INTRAVENOUS

## 2013-01-29 MED ORDER — SUCCINYLCHOLINE CHLORIDE 20 MG/ML IJ SOLN
INTRAMUSCULAR | Status: DC | PRN
  Administered 2013-01-29: 100 mg via INTRAVENOUS

## 2013-01-29 MED ORDER — MIDAZOLAM HCL 2 MG/2ML IJ SOLN: 1.0000 mg | INTRAMUSCULAR | Status: DC | PRN

## 2013-01-29 MED ORDER — ONDANSETRON HCL 4 MG/2ML IJ SOLN
INTRAMUSCULAR | Status: DC | PRN
  Administered 2013-01-29: 4 mg via INTRAVENOUS

## 2013-01-29 MED ORDER — LACTATED RINGERS IV SOLN
INTRAVENOUS | Status: DC
  Administered 2013-01-29 (×2): via INTRAVENOUS

## 2013-01-29 MED ORDER — FENTANYL CITRATE 0.05 MG/ML IJ SOLN
INTRAMUSCULAR | Status: DC | PRN
  Administered 2013-01-29: 100 ug via INTRAVENOUS

## 2013-01-29 MED ORDER — OXYCODONE-ACETAMINOPHEN 5-325 MG PO TABS: 1.0000 | ORAL_TABLET | ORAL | Status: DC | PRN

## 2013-01-29 MED ORDER — MIDAZOLAM HCL 5 MG/5ML IJ SOLN
INTRAMUSCULAR | Status: DC | PRN
  Administered 2013-01-29: 2 mg via INTRAVENOUS

## 2013-01-29 MED ORDER — PROPOFOL 10 MG/ML IV BOLUS
INTRAVENOUS | Status: DC | PRN
  Administered 2013-01-29: 200 mg via INTRAVENOUS

## 2013-01-29 MED ORDER — CEFAZOLIN SODIUM-DEXTROSE 2-3 GM-% IV SOLR
2.0000 g | INTRAVENOUS | Status: AC
  Administered 2013-01-29: 2 g via INTRAVENOUS

## 2013-01-29 MED ORDER — LIDOCAINE HCL (CARDIAC) 20 MG/ML IV SOLN
INTRAVENOUS | Status: DC | PRN
  Administered 2013-01-29: 50 mg via INTRAVENOUS

## 2013-01-29 MED ORDER — FENTANYL CITRATE 0.05 MG/ML IJ SOLN: 50.0000 ug | INTRAMUSCULAR | Status: DC | PRN

## 2013-01-29 SURGICAL SUPPLY — 49 items
ADH SKN CLS APL DERMABOND .7 (GAUZE/BANDAGES/DRESSINGS) ×1
BIOPATCH RED 1 DISK 7.0 (GAUZE/BANDAGES/DRESSINGS) ×1 IMPLANT
BLADE HEX COATED 2.75 (ELECTRODE) ×2 IMPLANT
BLADE SURG 15 STRL LF DISP TIS (BLADE) ×1 IMPLANT
BLADE SURG 15 STRL SS (BLADE) ×2
CANISTER SUCTION 1200CC (MISCELLANEOUS) IMPLANT
CHLORAPREP W/TINT 26ML (MISCELLANEOUS) ×2 IMPLANT
CLOTH BEACON ORANGE TIMEOUT ST (SAFETY) ×2 IMPLANT
COVER MAYO STAND STRL (DRAPES) ×2 IMPLANT
COVER TABLE BACK 60X90 (DRAPES) ×2 IMPLANT
DECANTER SPIKE VIAL GLASS SM (MISCELLANEOUS) IMPLANT
DERMABOND ADVANCED (GAUZE/BANDAGES/DRESSINGS) ×1
DERMABOND ADVANCED .7 DNX12 (GAUZE/BANDAGES/DRESSINGS) ×1 IMPLANT
DRAIN JP 10F RND SILICONE (MISCELLANEOUS) ×1 IMPLANT
DRAPE PED LAPAROTOMY (DRAPES) ×2 IMPLANT
DRAPE UTILITY XL STRL (DRAPES) ×2 IMPLANT
DRSG TEGADERM 2-3/8X2-3/4 SM (GAUZE/BANDAGES/DRESSINGS) ×2 IMPLANT
ELECT REM PT RETURN 9FT ADLT (ELECTROSURGICAL) ×2
ELECTRODE REM PT RTRN 9FT ADLT (ELECTROSURGICAL) ×1 IMPLANT
EVACUATOR SILICONE 100CC (DRAIN) ×1 IMPLANT
GAUZE SPONGE 4X4 12PLY STRL LF (GAUZE/BANDAGES/DRESSINGS) ×2 IMPLANT
GLOVE BIO SURGEON STRL SZ 6 (GLOVE) ×2 IMPLANT
GLOVE BIO SURGEON STRL SZ7 (GLOVE) ×2 IMPLANT
GLOVE BIOGEL PI IND STRL 6.5 (GLOVE) ×1 IMPLANT
GLOVE BIOGEL PI IND STRL 7.0 (GLOVE) IMPLANT
GLOVE BIOGEL PI INDICATOR 6.5 (GLOVE) ×1
GLOVE BIOGEL PI INDICATOR 7.0 (GLOVE) ×2
GOWN PREVENTION PLUS XLARGE (GOWN DISPOSABLE) ×4 IMPLANT
GOWN PREVENTION PLUS XXLARGE (GOWN DISPOSABLE) ×2 IMPLANT
NDL HYPO 25X1 1.5 SAFETY (NEEDLE) ×1 IMPLANT
NEEDLE HYPO 25X1 1.5 SAFETY (NEEDLE) ×2 IMPLANT
NS IRRIG 1000ML POUR BTL (IV SOLUTION) IMPLANT
PACK BASIN DAY SURGERY FS (CUSTOM PROCEDURE TRAY) ×2 IMPLANT
PENCIL BUTTON HOLSTER BLD 10FT (ELECTRODE) ×2 IMPLANT
SLEEVE SCD COMPRESS KNEE MED (MISCELLANEOUS) ×1 IMPLANT
SPONGE LAP 4X18 X RAY DECT (DISPOSABLE) ×2 IMPLANT
STAPLER VISISTAT 35W (STAPLE) IMPLANT
SUT ETHILON 3 0 PS 1 (SUTURE) ×1 IMPLANT
SUT MNCRL AB 4-0 PS2 18 (SUTURE) ×2 IMPLANT
SUT SILK 3 0 TIES 17X18 (SUTURE)
SUT SILK 3-0 18XBRD TIE BLK (SUTURE) IMPLANT
SUT VIC AB 3-0 SH 27 (SUTURE) ×2
SUT VIC AB 3-0 SH 27X BRD (SUTURE) ×1 IMPLANT
SUT VICRYL 4-0 PS2 18IN ABS (SUTURE) ×2 IMPLANT
SYR CONTROL 10ML LL (SYRINGE) ×2 IMPLANT
TOWEL OR 17X24 6PK STRL BLUE (TOWEL DISPOSABLE) ×2 IMPLANT
TOWEL OR NON WOVEN STRL DISP B (DISPOSABLE) ×2 IMPLANT
TUBE CONNECTING 20X1/4 (TUBING) IMPLANT
YANKAUER SUCT BULB TIP NO VENT (SUCTIONS) IMPLANT

## 2013-01-29 NOTE — Anesthesia Preprocedure Evaluation (Signed)
Anesthesia Evaluation  Patient identified by MRN, date of birth, ID band Patient awake    Reviewed: Allergy & Precautions, H&P , NPO status , Patient's Chart, lab work & pertinent test results  Airway Mallampati: I TM Distance: >3 FB Neck ROM: Full    Dental  (+) Teeth Intact and Dental Advisory Given   Pulmonary  breath sounds clear to auscultation        Cardiovascular Rhythm:Regular Rate:Normal     Neuro/Psych    GI/Hepatic   Endo/Other    Renal/GU      Musculoskeletal   Abdominal   Peds  Hematology   Anesthesia Other Findings   Reproductive/Obstetrics                           Anesthesia Physical Anesthesia Plan  ASA: II  Anesthesia Plan: General   Post-op Pain Management:    Induction: Intravenous  Airway Management Planned: Oral ETT  Additional Equipment:   Intra-op Plan:   Post-operative Plan: Extubation in OR  Informed Consent: I have reviewed the patients History and Physical, chart, labs and discussed the procedure including the risks, benefits and alternatives for the proposed anesthesia with the patient or authorized representative who has indicated his/her understanding and acceptance.   Dental advisory given  Plan Discussed with: CRNA, Surgeon and Anesthesiologist  Anesthesia Plan Comments:         Anesthesia Quick Evaluation

## 2013-01-29 NOTE — Interval H&P Note (Signed)
History and Physical Interval Note:  01/29/2013 11:57 AM  Gabrielle Duncan  has presented today for surgery, with the diagnosis of chest wall mass   The various methods of treatment have been discussed with the patient and family. After consideration of risks, benefits and other options for treatment, the patient has consented to  Procedure(s): EXCISION of chest wall mass 8 cm (N/A) as a surgical intervention .  The patient's history has been reviewed, patient examined, no change in status, stable for surgery.  I have reviewed the patient's chart and labs.  Questions were answered to the patient's satisfaction.     Somtochukwu Woollard

## 2013-01-29 NOTE — H&P (View-Only) (Signed)
Patient ID: Gabrielle Duncan, female   DOB: 1956/09/11, 57 y.o.   MRN: [id number]  Chief Complaint  Patient presents with  . Other    Eval fatty mass on chest    HPI Gabrielle Duncan is a 57 y.o. female.   HPI Pt is a 57 yo female who is overall very healthy except for bad osteoarthritis who presents with a growth over her sternum around a year.  It started very small.  It has continued to grow.  Sometimes the skin over the top gets very red and tender.  She has had imaging in this region which did not demonstrate the mass, but did rule out several things.  She has had shoulder surgery and neck surgery in the last year, and is concerned that the neck swelling may reflect extension of the mass into the neck.    Past Medical History  Diagnosis Date  . Arthritis   . Eczema   . Osteoarthritis of right shoulder region 09/24/2012    Past Surgical History  Procedure Laterality Date  . Abdominal hysterectomy  1997  . Cholecystectomy    . Shoulder surgery      debridement and partial clavical removal  . Cervical fusion  2005  . Anterior cervical decomp/discectomy fusion  08/08/2012    Procedure: ANTERIOR CERVICAL DECOMPRESSION/DISCECTOMY FUSION 2 LEVEL/HARDWARE REMOVAL;  Surgeon: Tia Alert, MD;  Location: [facility];  Service: Neurosurgery;  Laterality: N/A;  anterior cervical five-six to six seven decompression fusion with removal plate four-five  . Total shoulder arthroplasty  09/24/2012    Procedure: TOTAL SHOULDER ARTHROPLASTY;  Surgeon: Eulas Post, MD;  Location: [facility];  Service: Orthopedics;  Laterality: Right;  . Eye surgery      for dry eye syndrome    History reviewed. No pertinent family history.  Social History History  Substance Use Topics  . Smoking status: Never Smoker   . Smokeless tobacco: Never Used  . Alcohol Use: No    No Known Allergies  Current Outpatient Prescriptions  Medication Sig Dispense Refill  . loratadine (CLARITIN) 10 MG tablet Take 10 mg by  mouth daily as needed for allergies.        No current facility-administered medications for this visit.    Review of Systems Review of Systems  Musculoskeletal: Positive for arthralgias.  Skin:       Chest wall mass 8x6 cm  Hematological: Bruises/bleeds easily.    Blood pressure 110/70, pulse 72, resp. rate 18, height 5' 6.5" (1.689 m), weight 155 lb (70.308 kg).  Physical Exam Physical Exam  Constitutional: She is oriented to person, place, and time. She appears well-developed and well-nourished. No distress.  HENT:  Head: Normocephalic and atraumatic.  Right Ear: External ear normal.  Left Ear: External ear normal.  Eyes: Conjunctivae are normal. Pupils are equal, round, and reactive to light. No scleral icterus.  Neck: Normal range of motion. Neck supple. No tracheal deviation present. No thyromegaly present.  Right neck diffusely larger than left.  No palpable mass or thyromegaly  No profound JVD.    Cardiovascular: Normal rate, regular rhythm, normal heart sounds and intact distal pulses.   Pulmonary/Chest: Effort normal and breath sounds normal. No respiratory distress. She has no wheezes. She exhibits mass. She exhibits no tenderness.    8x6 cm mass  Abdominal: Soft. Bowel sounds are normal. She exhibits no distension. There is no tenderness.  Musculoskeletal: She exhibits no edema.  Antalgic gait, using cane  Lymphadenopathy:    She has no cervical adenopathy.  Neurological: She is alert and oriented to person, place, and time. Coordination normal.  Skin: Skin is warm and dry. No rash noted. She is not diaphoretic. No erythema. No pallor.  Psychiatric: She has a normal mood and affect. Her behavior is normal. Judgment and thought content normal.  Very gregarious    Data Reviewed n/a  Assessment/Plan    Right neck swelling. Pt's right neck swelling concerning for DVT or central venous occlusion.  Cannot palpate discrete mass.    Will get ultrasound of neck  to evaluate.    Lipoma of skin and subcutaneous tissue of trunk Pt mass is likely lipoma. Pt is due for mammogram.  Will go ahead and procure mammo before surgery.    Think unlikely to be revealing.  However, pt very behind on screening.  Will plan excision of fatty mass.  Reviewed risks and benefits including bleeding, infection, damage to adjacent structures, divot, wound complications.  Reviewed lifting and activity restrictions.        Gabrielle Duncan 01/13/2013, 9:45 PM

## 2013-01-29 NOTE — Anesthesia Postprocedure Evaluation (Signed)
  Anesthesia Post-op Note  Patient: Gabrielle Duncan  Procedure(s) Performed: Procedure(s): EXCISION of chest wall mass 8 cm (N/A)  Patient Location: PACU  Anesthesia Type:General  Level of Consciousness: awake, alert  and oriented  Airway and Oxygen Therapy: Patient Spontanous Breathing and Patient connected to face mask oxygen  Post-op Pain: mild  Post-op Assessment: Post-op Vital signs reviewed  Post-op Vital Signs: Reviewed  Complications: No apparent anesthesia complications

## 2013-01-29 NOTE — Op Note (Signed)
PRE-OPERATIVE DIAGNOSIS: chest wall lipoma  POST-OPERATIVE DIAGNOSIS:  Same  PROCEDURE:  Procedure(s): Excision of chest wall lipoma 6x5 cm, subcutaneous  SURGEON:  Surgeon(s): Almond Lint, MD  ANESTHESIA:   local and general  DRAINS: (10 Fr) Blake drain(s) in the chest wall   LOCAL MEDICATIONS USED:  MARCAINE    and LIDOCAINE   SPECIMEN:  Source of Specimen:  chest wall mass  DISPOSITION OF SPECIMEN:  PATHOLOGY  COUNTS:  YES  DICTATION: .Dragon Dictation  PLAN OF CARE: Discharge to home after PACU  PATIENT DISPOSITION:  PACU - hemodynamically stable.   PROCEDURE:  The patient was identified in the holding area and taken to the operating room where she was placed supine on the operating room table. General anesthesia was induced. The arms were tucked and her chest was prepped and draped in sterile fashion. Timeouts performed according to the surgical safety checklist. The was correct we continued. A curvilinear transverse incision was made over the mass.  Skin flaps were created with the assistance of the cautery and the skin hooks. Once the mass was fully exposed anteriorly, was grasped and elevated off of the sternum and chest wall musculature. The cautery was used to assist with dissection. The cavity was irrigated. Hemostasis was achieved with the cautery. A small drain was placed in order to minimize the formation of seroma. This was secured with a 3-0 nylon. The skin was reapproximated with 3-0 Vicryl deep dermal interrupted stitches and 4-0 Monocryl running subcuticular stitch. The wound was cleaned, dried, and dressed with sterile dressings. Needle sponge and instrument counts were correct x2. The patient was taken to PACU in stable condition.

## 2013-01-29 NOTE — Anesthesia Procedure Notes (Signed)
Procedure Name: Intubation Date/Time: 01/29/2013 12:16 PM Performed by: Zenia Resides D Pre-anesthesia Checklist: Patient identified, Emergency Drugs available, Suction available and Patient being monitored Patient Re-evaluated:Patient Re-evaluated prior to inductionOxygen Delivery Method: Circle System Utilized Preoxygenation: Pre-oxygenation with 100% oxygen Intubation Type: IV induction Ventilation: Mask ventilation without difficulty Laryngoscope Size: Miller and 2 Grade View: Grade I Tube type: Oral Number of attempts: 1 Airway Equipment and Method: stylet and oral airway Placement Confirmation: ETT inserted through vocal cords under direct vision,  positive ETCO2 and breath sounds checked- equal and bilateral Secured at: 22 cm Tube secured with: Tape Dental Injury: Teeth and Oropharynx as per pre-operative assessment

## 2013-01-29 NOTE — Transfer of Care (Signed)
Immediate Anesthesia Transfer of Care Note  Patient: Gabrielle Duncan  Procedure(s) Performed: Procedure(s): EXCISION of chest wall mass 8 cm (N/A)  Patient Location: PACU  Anesthesia Type:General  Level of Consciousness: awake, alert  and oriented  Airway & Oxygen Therapy: Patient Spontanous Breathing and Patient connected to face mask oxygen  Post-op Assessment: Report given to PACU RN and Post -op Vital signs reviewed and stable  Post vital signs: Reviewed and stable  Complications: No apparent anesthesia complications

## 2013-01-30 ENCOUNTER — Encounter (HOSPITAL_BASED_OUTPATIENT_CLINIC_OR_DEPARTMENT_OTHER): Payer: Self-pay | Admitting: General Surgery

## 2013-02-06 ENCOUNTER — Telehealth (INDEPENDENT_AMBULATORY_CARE_PROVIDER_SITE_OTHER): Payer: Self-pay

## 2013-02-06 NOTE — Telephone Encounter (Signed)
The pt called to report her bp was up 189/49 yesterday at the blood pressure station at her drug store.  The pharmacist rechecked it and it was close to the same.  She usually runs low and is now 89/62.  She had surgery on 4/30 by Dr Donell Beers for a lipoma.  She also had lumbar fusion 4/2 by Dr Yetta Barre for osteoarthritis.  She has no fever.  I told her this is probably not related to the lipoma surgery and would more likely from the back surgery.  I told her to let her primary care know about it and possibly Dr Yetta Barre.  I will also forward this to Dr Donell Beers to let her know.

## 2013-02-14 ENCOUNTER — Ambulatory Visit (INDEPENDENT_AMBULATORY_CARE_PROVIDER_SITE_OTHER): Payer: BC Managed Care – PPO | Admitting: General Surgery

## 2013-02-14 ENCOUNTER — Encounter (INDEPENDENT_AMBULATORY_CARE_PROVIDER_SITE_OTHER): Payer: Self-pay | Admitting: General Surgery

## 2013-02-14 VITALS — BP 114/82 | HR 64 | Temp 97.8°F | Resp 16 | Ht 66.5 in | Wt 157.8 lb

## 2013-02-14 DIAGNOSIS — R221 Localized swelling, mass and lump, neck: Secondary | ICD-10-CM

## 2013-02-14 DIAGNOSIS — D171 Benign lipomatous neoplasm of skin and subcutaneous tissue of trunk: Secondary | ICD-10-CM

## 2013-02-14 DIAGNOSIS — D1739 Benign lipomatous neoplasm of skin and subcutaneous tissue of other sites: Secondary | ICD-10-CM

## 2013-02-14 DIAGNOSIS — R220 Localized swelling, mass and lump, head: Secondary | ICD-10-CM

## 2013-02-14 NOTE — Assessment & Plan Note (Signed)
Pt did not get ultrasound soft tissue neck yet.    Will get that and follow up.

## 2013-02-14 NOTE — Progress Notes (Signed)
HISTORY: Pt without pain.  She did not take pain medications.  She has been measuring drain output.  She is doing well.    Regarding her neck swelling, she thinks it has slightly improved, but she is still having issues swallowing.  This is remote from the lipoma.      EXAM: General:  Alert and oriented Incision:  Healing well.  Drain output down adequately.     PATHOLOGY: Benign lipoma   ASSESSMENT AND PLAN:   Right neck swelling. Pt did not get ultrasound soft tissue neck yet.    Will get that and follow up.  Lipoma of skin and subcutaneous tissue of trunk No evidence of surgical complications.  Drain removed.  Follow up in 4 weeks.        Maudry Diego, MD Surgical Oncology, General & Endocrine Surgery [facility]  Nadean Corwin, MD Lucky Cowboy, MD

## 2013-02-14 NOTE — Patient Instructions (Signed)
Follow up in 4 weeks.  Get ultrasound soft tissue.

## 2013-02-14 NOTE — Assessment & Plan Note (Signed)
No evidence of surgical complications.  Drain removed.  Follow up in 4 weeks.

## 2013-02-20 ENCOUNTER — Other Ambulatory Visit (INDEPENDENT_AMBULATORY_CARE_PROVIDER_SITE_OTHER): Payer: Self-pay | Admitting: General Surgery

## 2013-02-20 ENCOUNTER — Telehealth (INDEPENDENT_AMBULATORY_CARE_PROVIDER_SITE_OTHER): Payer: Self-pay | Admitting: General Surgery

## 2013-02-20 DIAGNOSIS — R221 Localized swelling, mass and lump, neck: Secondary | ICD-10-CM

## 2013-02-20 NOTE — Telephone Encounter (Signed)
Left message for patient that she has appt at [address]  02/26/13 130 pm

## 2013-02-26 ENCOUNTER — Ambulatory Visit
Admission: RE | Admit: 2013-02-26 | Discharge: 2013-02-26 | Disposition: A | Discharge status: Home / Self Care | Payer: BC Managed Care – PPO | Source: Ambulatory Visit | Attending: General Surgery | Admitting: General Surgery

## 2013-02-26 DIAGNOSIS — R221 Localized swelling, mass and lump, neck: Secondary | ICD-10-CM

## 2013-03-31 ENCOUNTER — Ambulatory Visit (INDEPENDENT_AMBULATORY_CARE_PROVIDER_SITE_OTHER): Payer: BC Managed Care – PPO | Admitting: General Surgery

## 2013-03-31 ENCOUNTER — Encounter (INDEPENDENT_AMBULATORY_CARE_PROVIDER_SITE_OTHER): Payer: Self-pay | Admitting: General Surgery

## 2013-03-31 VITALS — BP 122/82 | HR 68 | Temp 97.7°F | Resp 16 | Ht 66.0 in | Wt 156.8 lb

## 2013-03-31 DIAGNOSIS — E041 Nontoxic single thyroid nodule: Secondary | ICD-10-CM

## 2013-03-31 DIAGNOSIS — D171 Benign lipomatous neoplasm of skin and subcutaneous tissue of trunk: Secondary | ICD-10-CM

## 2013-03-31 HISTORY — DX: Nontoxic single thyroid nodule: E04.1

## 2013-03-31 LAB — THYROID PANEL WITH TSH: TSH: 1.657 u[IU]/mL (ref 0.350–4.500)

## 2013-03-31 NOTE — Assessment & Plan Note (Signed)
Pt has additional fatty nodule at superior aspect of sternum that is very sore.   Will work up thyroid first prior to planning therapy.

## 2013-03-31 NOTE — Progress Notes (Signed)
HISTORY: Pt is 57 yo F that is s/p excision of lipoma that also had complaints of hoarseness and neck swelling.  She has had ultrasound that demonstrates nodules bilaterally.  A nodule on the left meets biopsy criteria.  Her surgical site is OK, but she has a new lipoma at upper sternum.  This has become more sore since her activity has resumed.     PERTINENT REVIEW OF SYSTEMS: Otherwise negative.    Filed Vitals:   03/31/13 1218  BP: 122/82  Pulse: 68  Temp: 97.7 F (36.5 C)  Resp: 16   Filed Weights   03/31/13 1218  Weight: 156 lb 12.8 oz (71.124 kg)     EXAM: Head: Normocephalic and atraumatic.  Eyes:  Conjunctivae are normal. Pupils are equal, round, and reactive to light. No scleral icterus.  Neck:  Sl diminished range of motion, post surgical. Neck supple, decreased swelling. No tracheal deviation present. No thyromegaly or palpable nodules.   Resp: No respiratory distress, normal effort. Abd:  Abdomen is soft, non distended and non tender. No masses are palpable.  There is no rebound and no guarding.  Neurological: Alert and oriented to person, place, and time. Coordination normal.  Skin: Skin is warm and dry. No rash noted. No diaphoretic. No erythema. No pallor.   There is tender fatty mass at manubrium of sternum that is 5 x2x1 cm.   Psychiatric: Normal mood and affect. Normal behavior. Judgment and thought content normal.      ASSESSMENT AND PLAN:   Left thyroid nodule Will schedule ultrasound guided thyroid biopsy and get thyroid labs.  Pt may have neoplastic nodule vs goiter, given presence of other small nodules.    Lipoma of skin and subcutaneous tissue of trunk Pt has additional fatty nodule at superior aspect of sternum that is very sore.   Will work up thyroid first prior to planning therapy.        Maudry Diego, MD Surgical Oncology, General & Endocrine Surgery [facility]  MCKEOWN,WILLIAM DAVID, MD No ref. provider  found

## 2013-03-31 NOTE — Patient Instructions (Signed)
Get labs and ultrasound guided thyroid biopsy.  Follow up with me in 2-4 weeks (at least 3 days after biopsy).

## 2013-03-31 NOTE — Assessment & Plan Note (Signed)
Will schedule ultrasound guided thyroid biopsy and get thyroid labs.  Pt may have neoplastic nodule vs goiter, given presence of other small nodules.

## 2013-04-02 ENCOUNTER — Other Ambulatory Visit (HOSPITAL_COMMUNITY)
Admission: RE | Admit: 2013-04-02 | Discharge: 2013-04-02 | Disposition: A | Discharge status: Home / Self Care | Payer: BC Managed Care – PPO | Source: Ambulatory Visit | Attending: General Surgery | Admitting: General Surgery

## 2013-04-02 ENCOUNTER — Ambulatory Visit
Admission: RE | Admit: 2013-04-02 | Discharge: 2013-04-02 | Disposition: A | Discharge status: Home / Self Care | Payer: BC Managed Care – PPO | Source: Ambulatory Visit | Attending: General Surgery | Admitting: General Surgery

## 2013-04-02 DIAGNOSIS — E049 Nontoxic goiter, unspecified: Secondary | ICD-10-CM | POA: Insufficient documentation

## 2013-04-07 ENCOUNTER — Telehealth (INDEPENDENT_AMBULATORY_CARE_PROVIDER_SITE_OTHER): Payer: Self-pay

## 2013-04-07 NOTE — Progress Notes (Signed)
Quick Note:  Please let pt know that biopsy is benign. No evidence of cancer. ______

## 2013-04-07 NOTE — Telephone Encounter (Signed)
Pt notified of benign thyroid biopsy results.

## 2013-04-15 ENCOUNTER — Encounter (INDEPENDENT_AMBULATORY_CARE_PROVIDER_SITE_OTHER): Payer: Self-pay | Admitting: General Surgery

## 2013-04-15 ENCOUNTER — Ambulatory Visit (INDEPENDENT_AMBULATORY_CARE_PROVIDER_SITE_OTHER): Payer: BC Managed Care – PPO | Admitting: General Surgery

## 2013-04-15 VITALS — BP 118/72 | HR 82 | Resp 14 | Ht 66.5 in | Wt 156.2 lb

## 2013-04-15 DIAGNOSIS — D171 Benign lipomatous neoplasm of skin and subcutaneous tissue of trunk: Secondary | ICD-10-CM

## 2013-04-15 DIAGNOSIS — M25519 Pain in unspecified shoulder: Secondary | ICD-10-CM

## 2013-04-15 DIAGNOSIS — E041 Nontoxic single thyroid nodule: Secondary | ICD-10-CM

## 2013-04-15 DIAGNOSIS — R220 Localized swelling, mass and lump, head: Secondary | ICD-10-CM

## 2013-04-15 DIAGNOSIS — R1310 Dysphagia, unspecified: Secondary | ICD-10-CM

## 2013-04-15 DIAGNOSIS — R221 Localized swelling, mass and lump, neck: Secondary | ICD-10-CM

## 2013-04-15 NOTE — Assessment & Plan Note (Signed)
Advised patient that i did not think this was a result of her thyroid, given that it was not enlarged.  I recommended that she see her PCP or ENT surgeon to discuss.

## 2013-04-15 NOTE — Assessment & Plan Note (Signed)
Pt does have soft tissue fullness/possible lipoma over upper sternum.  Seems that discomfort may be from underlying sternoclavicular joint pain or costochondritis.   Will ask Dr. Shelba Flake opinion regarding this.  Pt due to follow up with him in next 1-2 months following shoulder replacement.

## 2013-04-15 NOTE — Assessment & Plan Note (Signed)
Biopsy was benign.

## 2013-04-15 NOTE — Patient Instructions (Signed)
I will contact Dr. 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    If he thinks that is not cause of tenderness, call and schedule to see me to discuss removal of the additional fatty tissue at top of sternum.

## 2013-04-15 NOTE — Progress Notes (Signed)
HISTORY: Patient is a 57 year old female that I saw with a lipoma of the anterior chest wall over the sternum.  At her initial evaluation, she complained of neck swelling. Her neck did appear asymmetric and I ordered Dopplers and a neck ultrasound. She did not have any evidence of a DVT in her neck or upper extremities. Her neck ultrasound was not performed initially. I did take her to surgery and remove the lipoma from her sternum. This was very sore and she felt significantly improved after surgery. However, she continues to complain of some swelling over the upper part of the sternum farther away from the surgical site.  Because of her complaints of dysphasia and neck swelling, I did pursue her neck ultrasound and her thyroid demonstrated multiple nodules. One of these met criteria for biopsy which was performed. This demonstrated benign follicular nodule.  She has a family history of thyroid cancer and multinodular goiter. She is quite concerned about this. Her thyroid function tests are normal.   She does follow up again to discuss the results of the biopsy as well as to reevaluate the soft tissue prominence over the upper sternum.   PERTINENT REVIEW OF SYSTEMS: Negative x 11 other than HPI  Filed Vitals:   04/15/13 1557  BP: 118/72  Pulse: 82  Resp: 14   Filed Weights   04/15/13 1557  Weight: 156 lb 3.2 oz (70.852 kg)    EXAM: Head: Normocephalic and atraumatic.  Eyes:  Conjunctivae are normal. Pupils are equal, round, and reactive to light. No scleral icterus.  Neck:  Normal range of motion. Neck supple. No tracheal deviation present. No thyromegaly present. Right neck swelling significantly improved.  Likely was residual post op. Chest wall:  Incision at top of cleavage well healed.  Soft tissue fullness over manubrium of sternum.  Soft, mobile.  Tenderness seems to be at [facility] joints.  Difficult to separate from tenderness of mass.   Resp: No respiratory distress, normal effort. Abd:   Abdomen is soft, non distended and non tender. No masses are palpable.  There is no rebound and no guarding.  Neurological: Alert and oriented to person, place, and time. Coordination normal.  Skin: Skin is warm and dry. No rash noted. No diaphoretic. No erythema. No pallor.  Psychiatric: Normal mood and affect. Normal behavior. Judgment and thought content normal.   Pathology from lipoma resection 4/30 Soft tissue mass, simple excision, Chest wall - BENIGN LIPOMA (8.0 CM IN AGGREGATE), SEE COMMENT. Microscopic Comment One of the sections demonstrates a solitary perivascular poorly formed epithelioid granuloma with associated benign lymphocytic inflammation. There are no associated features of fat necrosis with this focus. Although the findings are of uncertain significance, if there is a history of systemic granulomatous disease, the finding may represent a soft tissue manifestation.   ASSESSMENT AND PLAN:   Dysphagia, unspecified(787.20) Advised patient that i did not think this was a result of her thyroid, given that it was not enlarged.  I recommended that she see her PCP or ENT surgeon to discuss.  Left thyroid nodule Biopsy was benign.     Right neck swelling. Unclear etiology.  Improved, but not resolved.    Sternoclavicular joint pain Pt does have soft tissue fullness/possible lipoma over upper sternum.  Seems that discomfort may be from underlying sternoclavicular joint pain or costochondritis.   Will ask Dr. Shelba Flake opinion regarding this.  Pt due to follow up with him in next 1-2 months following shoulder replacement.    Lipoma  of skin and subcutaneous tissue of trunk Original area well healed. May need to resect other area of soft tissue prominence due to symptoms if [place] joints not the problem.    Does sound like original lipoma did demonstrate some inflammation.     Maudry Diego, MD Surgical Oncology, General & Endocrine Surgery [facility]  MCKEOWN,WILLIAM DAVID, MD No ref. provider found

## 2013-04-15 NOTE — Assessment & Plan Note (Signed)
Original area well healed. May need to resect other area of soft tissue prominence due to symptoms if [place] joints not the problem.

## 2013-04-15 NOTE — Assessment & Plan Note (Signed)
Unclear etiology.  Improved, but not resolved.

## 2013-04-29 ENCOUNTER — Encounter (INDEPENDENT_AMBULATORY_CARE_PROVIDER_SITE_OTHER): Payer: BC Managed Care – PPO | Admitting: General Surgery

## 2013-05-14 ENCOUNTER — Telehealth (INDEPENDENT_AMBULATORY_CARE_PROVIDER_SITE_OTHER): Payer: Self-pay

## 2013-05-14 NOTE — Telephone Encounter (Signed)
Dr. Raye Sorrow nurse calling for thyroid bx report and pathology results.  Information faxed to [facility] by Medical Records.

## 2013-05-15 ENCOUNTER — Other Ambulatory Visit: Payer: Self-pay | Admitting: Otolaryngology

## 2013-05-15 DIAGNOSIS — R220 Localized swelling, mass and lump, head: Secondary | ICD-10-CM

## 2013-06-03 ENCOUNTER — Ambulatory Visit
Admission: RE | Admit: 2013-06-03 | Discharge: 2013-06-03 | Disposition: A | Discharge status: Home / Self Care | Payer: BC Managed Care – PPO | Source: Ambulatory Visit | Attending: Otolaryngology | Admitting: Otolaryngology

## 2013-06-03 DIAGNOSIS — R220 Localized swelling, mass and lump, head: Secondary | ICD-10-CM

## 2013-06-03 MED ORDER — IOHEXOL 300 MG/ML  SOLN
75.0000 mL | Freq: Once | INTRAMUSCULAR | Status: AC | PRN
  Administered 2013-06-03: 75 mL via INTRAVENOUS

## 2013-07-03 LAB — HM COLONOSCOPY

## 2013-07-18 ENCOUNTER — Ambulatory Visit (INDEPENDENT_AMBULATORY_CARE_PROVIDER_SITE_OTHER): Payer: BC Managed Care – PPO | Admitting: Surgery

## 2013-07-18 ENCOUNTER — Encounter (INDEPENDENT_AMBULATORY_CARE_PROVIDER_SITE_OTHER): Payer: Self-pay | Admitting: Surgery

## 2013-07-18 VITALS — BP 120/80 | HR 84 | Temp 98.6°F | Resp 15 | Ht 66.5 in | Wt 154.2 lb

## 2013-07-18 DIAGNOSIS — K649 Unspecified hemorrhoids: Secondary | ICD-10-CM | POA: Insufficient documentation

## 2013-07-18 NOTE — Progress Notes (Signed)
Patient ID: Gabrielle Duncan, female   DOB: Sep 23, 1956, 57 y.o.   MRN: [id number]  Chief Complaint  Patient presents with  . New Evaluation    eval bleeding hems    HPI Gabrielle Duncan is a 57 y.o. female.  The patient presents with a chief complaint of rectal bleeding. She was seen by Dr. Evette Cristal for rectal bleeding. Colonoscopy is done which shows internal hemorrhoids. She was seen here this past summer for a lipoma that was removed from her chest. She states rectal bleeding is intermittent. It is more noticeable when she wipes. She has had some burning and itching. Suppositories have not helped her symptoms at all for 2 weeks. HPI  Past Medical History  Diagnosis Date  . Seasonal allergies   . Osteoarthritis   . Mass of chest wall 12/2012    tender to the touch    Past Surgical History  Procedure Laterality Date  . Cholecystectomy    . Shoulder surgery  03/2012    debridement and partial clavical removal  . Cervical fusion  2005  . Anterior cervical decomp/discectomy fusion  08/08/2012    Procedure: ANTERIOR CERVICAL DECOMPRESSION/DISCECTOMY FUSION 2 LEVEL/HARDWARE REMOVAL;  Surgeon: Tia Alert, MD;  Location: [facility];  Service: Neurosurgery;  Laterality: N/A;  anterior cervical five-six to six seven decompression fusion with removal plate four-five  . Total shoulder arthroplasty  09/24/2012    Procedure: TOTAL SHOULDER ARTHROPLASTY;  Surgeon: Eulas Post, MD;  Location: [facility];  Service: Orthopedics;  Laterality: Right;  . Eye surgery      for dry eye syndrome  . Lumbar laminectomy/decompression microdiscectomy  01/01/2013    L4-5  . Lumbar fusion  01/01/2013    L4-5  . Abdominal hysterectomy  1997    complete  . Mass excision N/A 01/29/2013    Procedure: EXCISION of chest wall mass 8 cm;  Surgeon: Almond Lint, MD;  Location: [facility];  Service: General;  Laterality: N/A;    History reviewed. No pertinent family history.  Social History History    Substance Use Topics  . Smoking status: Never Smoker   . Smokeless tobacco: Never Used  . Alcohol Use: No    No Known Allergies  Current Outpatient Prescriptions  Medication Sig Dispense Refill  . acetaminophen (TYLENOL) 325 MG tablet Take 650 mg by mouth every 6 (six) hours as needed for pain.      . DiphenhydrAMINE HCl (BENADRYL ALLERGY PO) Take by mouth.      . Ibuprofen (ADVIL PO) Take by mouth.       No current facility-administered medications for this visit.    Review of Systems Review of Systems  Constitutional: Negative for fever, chills and unexpected weight change.  HENT: Negative for congestion, hearing loss, sore throat, trouble swallowing and voice change.   Eyes: Negative for visual disturbance.  Respiratory: Negative for cough and wheezing.   Cardiovascular: Negative for chest pain, palpitations and leg swelling.  Gastrointestinal: Positive for anal bleeding. Negative for nausea, vomiting, abdominal pain, diarrhea, constipation, blood in stool and abdominal distention.  Genitourinary: Negative for hematuria, vaginal bleeding and difficulty urinating.  Musculoskeletal: Negative for arthralgias.  Skin: Negative for rash and wound.  Neurological: Negative for seizures, syncope and headaches.  Hematological: Negative for adenopathy. Does not bruise/bleed easily.  Psychiatric/Behavioral: Negative for confusion.    Blood pressure 120/80, pulse 84, temperature 98.6 F (37 C), temperature source Temporal, resp. rate 15, height 5' 6.5" (1.689 m), weight  154 lb 3.2 oz (69.945 kg).  Physical Exam Physical Exam  Constitutional: She is oriented to person, place, and time. She appears well-developed and well-nourished.  HENT:  Head: Normocephalic and atraumatic.  Eyes: EOM are normal. Pupils are equal, round, and reactive to light.  Neck: Normal range of motion. Neck supple.  Cardiovascular: Normal rate and regular rhythm.   Pulmonary/Chest: Effort normal and breath  sounds normal.  Abdominal: Soft.  Genitourinary:     Neurological: She is alert and oriented to person, place, and time.  Skin: Skin is warm and dry.  Psychiatric: She has a normal mood and affect. Her behavior is normal. Thought content normal.    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 Risk include bleeding, infection, anal stenosis, possible incontinence, change in sensation of anal canal, recurrence, and the need for other operative procedures.The procedure has been discussed with the patient.  Alternative therapies have been discussed with the patient.  Operative risks include bleeding,  Infection,  Organ injury,  Nerve injury,  Blood vessel injury,  DVT,  Pulmonary embolism,  Death,  And possible reoperation.  Medical management risks include worsening of present situation.  The success of the procedure is 50 -90 % at treating patients symptoms.  The patient understands and agrees to proceed. Given size of hemorrhoids banding or  sclerotherapy would be unsuccessful.       Gabrielle Duncan A. 07/18/2013, 10:39 AM

## 2013-07-18 NOTE — Patient Instructions (Signed)
Hemorrhoidectomy Hemorrhoidectomy is surgery to remove hemorrhoids. Hemorrhoids are veins that have become swollen in the rectum. The rectum is the area from the bottom end of the intestines to the opening where bowel movements leave the body. Hemorrhoids can be uncomfortable. They can cause itching, bleeding and pain if a blood clot forms in them (thrombose). If hemorrhoids are small, surgery may not be needed. But if they cover a larger area, surgery is usually suggested.  LET YOUR CAREGIVER KNOW ABOUT:   Any allergies.  All medications you are taking, including:  Herbs, eyedrops, over-the-counter medications and creams.  Blood thinners (anticoagulants), aspirin or other drugs that could affect blood clotting.  Use of steroids (by mouth or as creams).  Previous problems with anesthetics, including local anesthetics.  Possibility of pregnancy, if this applies.  Any history of blood clots.  Any history of bleeding or other blood problems.  Previous surgery.  Smoking history.  Other health problems. RISKS AND COMPLICATIONS All surgery carries some risk. However, hemorrhoid surgery usually goes smoothly. Possible complications could include:  Urinary retention.  Bleeding.  Infection.  A painful incision.  A reaction to the anesthesia (this is not common). BEFORE THE PROCEDURE   Stop using aspirin and non-steroidal anti-inflammatory drugs (NSAIDs) for pain relief. This includes prescription drugs and over-the-counter drugs such as ibuprofen and naproxen. Also stop taking vitamin E. If possible, do this two weeks before your surgery.  If you take blood-thinners, ask your healthcare provider when you should stop taking them.  You will probably have blood and urine tests done several days before your surgery.  Do not eat or drink for about 8 hours before the surgery.  Arrive at least an hour before the surgery, or whenever your surgeon recommends. This will give you time to  check in and fill out any needed paperwork.  Hemorrhoidectomy is often an outpatient procedure. This means you will be able to go home the same day. Sometimes, though, people stay overnight in the hospital after the procedure. Ask your surgeon what to expect. Either way, make arrangements in advance for someone to drive you home. PROCEDURE   The preparation:  You will change into a hospital gown.  You will be given an IV. A needle will be inserted in your arm. Medication can flow directly into your body through this needle.  You might be given an enema to clear your rectum.  Once in the operating room, you will probably lie on your side or be repositioned later to lying on your stomach.  You will be given anesthesia (medication) so you will not feel anything during the surgery. The surgery often is done with local anesthesia (the area near the hemorrhoids will be numb and you will be drowsy but awake). Sometimes, general anesthesia is used (you will be asleep during the procedure).  The procedure:  There are a few different procedures for hemorrhoids. Be sure to ask you surgeon about the procedure, the risks and benefits.  Be sure to ask about what you need to do to take care of the wound, if there is one. AFTER THE PROCEDURE  You will stay in a recovery area until the anesthesia has worn off. Your blood pressure and pulse will be checked every so often.  You may feel a lot of pain in the area of the rectum.  Take all pain medication prescribed by your surgeon. Ask before taking any over-the-counter pain medicines.  Sometimes sitting in a warm bath can help relieve   your pain.  To make sure you have bowel movements without straining:  You will probably need to take stool softeners (usually a pill) for a few days.  You should drink 8 to 10 glasses of water each day.  Your activity will be restricted for awhile. Ask your caregiver for a list of what you should and should not do  while you recover. Document Released: 07/16/2009 Document Revised: 12/11/2011 Document Reviewed: 07/16/2009 Exit[employer] Patient Information 2014 [employer].  

## 2013-08-07 ENCOUNTER — Other Ambulatory Visit: Payer: Self-pay

## 2013-08-25 ENCOUNTER — Encounter (HOSPITAL_BASED_OUTPATIENT_CLINIC_OR_DEPARTMENT_OTHER): Payer: Self-pay | Admitting: *Deleted

## 2013-08-25 NOTE — Progress Notes (Signed)
To come in for [facility] labs-has had 4 surgeries this yr

## 2013-09-01 ENCOUNTER — Encounter (HOSPITAL_BASED_OUTPATIENT_CLINIC_OR_DEPARTMENT_OTHER)
Admission: RE | Admit: 2013-09-01 | Discharge: 2013-09-01 | Disposition: A | Discharge status: Home / Self Care | Payer: BC Managed Care – PPO | Source: Ambulatory Visit | Attending: Surgery | Admitting: Surgery

## 2013-09-01 LAB — CBC WITH DIFFERENTIAL/PLATELET
Basophils Absolute: 0.1 10*3/uL (ref 0.0–0.1)
HCT: 38 % (ref 36.0–46.0)
Lymphocytes Relative: 28 % (ref 12–46)
MCHC: 34.5 g/dL (ref 30.0–36.0)
Neutro Abs: 2.7 10*3/uL (ref 1.7–7.7)
Neutrophils Relative %: 50 % (ref 43–77)
Platelets: 262 10*3/uL (ref 150–400)
RDW: 12.5 % (ref 11.5–15.5)
WBC: 5.5 10*3/uL (ref 4.0–10.5)

## 2013-09-01 LAB — COMPREHENSIVE METABOLIC PANEL
ALT: 27 U/L (ref 0–35)
AST: 26 U/L (ref 0–37)
Alkaline Phosphatase: 68 U/L (ref 39–117)
CO2: 25 mEq/L (ref 19–32)
Chloride: 105 mEq/L (ref 96–112)
GFR calc non Af Amer: >90 mL/min (ref >90)
Potassium: 4.4 mEq/L (ref 3.5–5.1)
Sodium: 141 mEq/L (ref 135–145)
Total Bilirubin: 0.2 mg/dL — ABNORMAL LOW (ref 0.3–1.2)

## 2013-09-02 ENCOUNTER — Encounter (HOSPITAL_BASED_OUTPATIENT_CLINIC_OR_DEPARTMENT_OTHER)
Admission: RE | Disposition: A | Discharge status: Home / Self Care | Payer: Self-pay | Source: Ambulatory Visit | Attending: Surgery

## 2013-09-02 ENCOUNTER — Ambulatory Visit (HOSPITAL_BASED_OUTPATIENT_CLINIC_OR_DEPARTMENT_OTHER)
Admission: RE | Admit: 2013-09-02 | Discharge: 2013-09-02 | Disposition: A | Discharge status: Home / Self Care | Payer: BC Managed Care – PPO | Source: Ambulatory Visit | Attending: Surgery | Admitting: Surgery

## 2013-09-02 ENCOUNTER — Encounter (HOSPITAL_BASED_OUTPATIENT_CLINIC_OR_DEPARTMENT_OTHER): Payer: Self-pay | Admitting: *Deleted

## 2013-09-02 ENCOUNTER — Ambulatory Visit (HOSPITAL_BASED_OUTPATIENT_CLINIC_OR_DEPARTMENT_OTHER): Payer: BC Managed Care – PPO | Admitting: Anesthesiology

## 2013-09-02 ENCOUNTER — Encounter (HOSPITAL_BASED_OUTPATIENT_CLINIC_OR_DEPARTMENT_OTHER): Payer: BC Managed Care – PPO | Admitting: Anesthesiology

## 2013-09-02 DIAGNOSIS — K649 Unspecified hemorrhoids: Secondary | ICD-10-CM

## 2013-09-02 DIAGNOSIS — K648 Other hemorrhoids: Secondary | ICD-10-CM | POA: Insufficient documentation

## 2013-09-02 DIAGNOSIS — K644 Residual hemorrhoidal skin tags: Secondary | ICD-10-CM

## 2013-09-02 DIAGNOSIS — Z01812 Encounter for preprocedural laboratory examination: Secondary | ICD-10-CM | POA: Insufficient documentation

## 2013-09-02 HISTORY — PX: HEMORRHOID SURGERY: SHX153

## 2013-09-02 SURGERY — HEMORRHOIDECTOMY
Anesthesia: General | Site: Rectum

## 2013-09-02 MED ORDER — CEFAZOLIN SODIUM-DEXTROSE 2-3 GM-% IV SOLR
INTRAVENOUS | Status: AC
  Filled 2013-09-02: qty 50

## 2013-09-02 MED ORDER — PROPOFOL 10 MG/ML IV BOLUS
INTRAVENOUS | Status: DC | PRN
  Administered 2013-09-02: 50 mg via INTRAVENOUS
  Administered 2013-09-02: 150 mg via INTRAVENOUS

## 2013-09-02 MED ORDER — OXYCODONE-ACETAMINOPHEN 7.5-325 MG PO TABS: 1.0000 | ORAL_TABLET | ORAL | Status: DC | PRN

## 2013-09-02 MED ORDER — LACTATED RINGERS IV SOLN
INTRAVENOUS | Status: DC
  Administered 2013-09-02 (×2): via INTRAVENOUS

## 2013-09-02 MED ORDER — MIDAZOLAM HCL 5 MG/5ML IJ SOLN
INTRAMUSCULAR | Status: DC | PRN
  Administered 2013-09-02: 2 mg via INTRAVENOUS

## 2013-09-02 MED ORDER — POLYETHYLENE GLYCOL 3350 17 GM/SCOOP PO POWD: 1.0000 | Freq: Once | ORAL | Status: DC

## 2013-09-02 MED ORDER — OXYCODONE HCL 5 MG/5ML PO SOLN: 5.0000 mg | Freq: Once | ORAL | Status: DC | PRN

## 2013-09-02 MED ORDER — MIDAZOLAM HCL 2 MG/2ML IJ SOLN: 1.0000 mg | INTRAMUSCULAR | Status: DC | PRN

## 2013-09-02 MED ORDER — TRAMADOL HCL 50 MG PO TABS: 50.0000 mg | ORAL_TABLET | Freq: Four times a day (QID) | ORAL | Status: DC | PRN

## 2013-09-02 MED ORDER — PROMETHAZINE HCL 25 MG/ML IJ SOLN
6.2500 mg | Freq: Once | INTRAMUSCULAR | Status: AC | PRN
  Administered 2013-09-02: 6.25 mg via INTRAVENOUS

## 2013-09-02 MED ORDER — LIDOCAINE HCL (CARDIAC) 20 MG/ML IV SOLN
INTRAVENOUS | Status: DC | PRN
  Administered 2013-09-02: 100 mg via INTRAVENOUS

## 2013-09-02 MED ORDER — ONDANSETRON HCL 4 MG/2ML IJ SOLN
INTRAMUSCULAR | Status: DC | PRN
  Administered 2013-09-02: 4 mg via INTRAVENOUS

## 2013-09-02 MED ORDER — PROPOFOL 10 MG/ML IV BOLUS
INTRAVENOUS | Status: AC
  Filled 2013-09-02: qty 20

## 2013-09-02 MED ORDER — DEXAMETHASONE SODIUM PHOSPHATE 4 MG/ML IJ SOLN
INTRAMUSCULAR | Status: DC | PRN
  Administered 2013-09-02: 10 mg via INTRAVENOUS

## 2013-09-02 MED ORDER — PROMETHAZINE HCL 25 MG/ML IJ SOLN
INTRAMUSCULAR | Status: AC
  Filled 2013-09-02: qty 1

## 2013-09-02 MED ORDER — OXYCODONE HCL 5 MG PO TABS: 5.0000 mg | ORAL_TABLET | Freq: Once | ORAL | Status: DC | PRN

## 2013-09-02 MED ORDER — FENTANYL CITRATE 0.05 MG/ML IJ SOLN
INTRAMUSCULAR | Status: DC | PRN
  Administered 2013-09-02 (×2): 50 ug via INTRAVENOUS
  Administered 2013-09-02: 100 ug via INTRAVENOUS

## 2013-09-02 MED ORDER — BUPIVACAINE LIPOSOME 1.3 % IJ SUSP
INTRAMUSCULAR | Status: AC
  Filled 2013-09-02: qty 20

## 2013-09-02 MED ORDER — LIDOCAINE HCL 2 % EX GEL: 1.0000 "application " | CUTANEOUS | Status: DC | PRN

## 2013-09-02 MED ORDER — SODIUM CHLORIDE 0.9 % IJ SOLN
INTRAMUSCULAR | Status: AC
  Filled 2013-09-02: qty 60

## 2013-09-02 MED ORDER — FENTANYL CITRATE 0.05 MG/ML IJ SOLN: 50.0000 ug | INTRAMUSCULAR | Status: DC | PRN

## 2013-09-02 MED ORDER — HYDROMORPHONE HCL PF 1 MG/ML IJ SOLN
INTRAMUSCULAR | Status: AC
  Filled 2013-09-02: qty 1

## 2013-09-02 MED ORDER — FENTANYL CITRATE 0.05 MG/ML IJ SOLN
INTRAMUSCULAR | Status: AC
  Filled 2013-09-02: qty 4

## 2013-09-02 MED ORDER — DEXTROSE 5 % IV SOLN
3.0000 g | INTRAVENOUS | Status: AC
  Administered 2013-09-02: 2 g via INTRAVENOUS

## 2013-09-02 MED ORDER — FLEET ENEMA 7-19 GM/118ML RE ENEM: 1.0000 | ENEMA | Freq: Once | RECTAL | Status: DC

## 2013-09-02 MED ORDER — SODIUM CHLORIDE 0.9 % IJ SOLN
INTRAMUSCULAR | Status: DC | PRN
  Administered 2013-09-02: 08:00:00

## 2013-09-02 MED ORDER — HYDROMORPHONE HCL PF 1 MG/ML IJ SOLN
0.2500 mg | INTRAMUSCULAR | Status: DC | PRN
  Administered 2013-09-02 (×2): 0.5 mg via INTRAVENOUS

## 2013-09-02 MED ORDER — BUPIVACAINE-EPINEPHRINE PF 0.25-1:200000 % IJ SOLN
INTRAMUSCULAR | Status: AC
  Filled 2013-09-02: qty 30

## 2013-09-02 MED ORDER — MIDAZOLAM HCL 2 MG/2ML IJ SOLN
INTRAMUSCULAR | Status: AC
  Filled 2013-09-02: qty 2

## 2013-09-02 SURGICAL SUPPLY — 46 items
APL SKNCLS STERI-STRIP NONHPOA (GAUZE/BANDAGES/DRESSINGS)
BENZOIN TINCTURE PRP APPL 2/3 (GAUZE/BANDAGES/DRESSINGS) ×1 IMPLANT
BLADE SURG 11 STRL SS (BLADE) IMPLANT
BLADE SURG 15 STRL LF DISP TIS (BLADE) ×1 IMPLANT
BLADE SURG 15 STRL SS (BLADE) ×2
BRIEF STRETCH FOR OB PAD LRG (UNDERPADS AND DIAPERS) ×2 IMPLANT
CANISTER SUCT 1200ML W/VALVE (MISCELLANEOUS) ×2 IMPLANT
COVER MAYO STAND STRL (DRAPES) IMPLANT
COVER TABLE BACK 60X90 (DRAPES) IMPLANT
DECANTER SPIKE VIAL GLASS SM (MISCELLANEOUS) IMPLANT
DRAPE PED LAPAROTOMY (DRAPES) IMPLANT
DRAPE UTILITY XL STRL (DRAPES) IMPLANT
DRSG PAD ABDOMINAL 8X10 ST (GAUZE/BANDAGES/DRESSINGS) ×2 IMPLANT
ELECT COATED BLADE 2.86 ST (ELECTRODE) ×1 IMPLANT
ELECT REM PT RETURN 9FT ADLT (ELECTROSURGICAL) ×2
ELECTRODE REM PT RTRN 9FT ADLT (ELECTROSURGICAL) ×1 IMPLANT
GAUZE SPONGE 4X4 12PLY STRL LF (GAUZE/BANDAGES/DRESSINGS) IMPLANT
GLOVE BIOGEL PI IND STRL 7.0 (GLOVE) IMPLANT
GLOVE BIOGEL PI IND STRL 8 (GLOVE) ×1 IMPLANT
GLOVE BIOGEL PI INDICATOR 7.0 (GLOVE) ×1
GLOVE BIOGEL PI INDICATOR 8 (GLOVE) ×1
GLOVE ECLIPSE 6.5 STRL STRAW (GLOVE) ×2 IMPLANT
GLOVE ECLIPSE 8.0 STRL XLNG CF (GLOVE) ×2 IMPLANT
GLOVE EXAM NITRILE EXT CUFF MD (GLOVE) ×1 IMPLANT
GOWN PREVENTION PLUS XLARGE (GOWN DISPOSABLE) ×4 IMPLANT
HEMOSTAT SURGICEL 2X14 (HEMOSTASIS) ×1 IMPLANT
NDL HYPO 25X1 1.5 SAFETY (NEEDLE) ×1 IMPLANT
NEEDLE HYPO 25X1 1.5 SAFETY (NEEDLE) ×2 IMPLANT
PACK BASIN DAY SURGERY FS (CUSTOM PROCEDURE TRAY) ×2 IMPLANT
PACK LITHOTOMY IV (CUSTOM PROCEDURE TRAY) ×1 IMPLANT
PENCIL BUTTON HOLSTER BLD 10FT (ELECTRODE) ×1 IMPLANT
SHEARS HARMONIC 9CM CVD (BLADE) ×1 IMPLANT
SLEEVE SCD COMPRESS KNEE MED (MISCELLANEOUS) ×2 IMPLANT
SPONGE SURGIFOAM ABS GEL 100 (HEMOSTASIS) ×1 IMPLANT
SURGILUBE 2OZ TUBE FLIPTOP (MISCELLANEOUS) ×2 IMPLANT
SUT MON AB 3-0 SH 27 (SUTURE) ×2
SUT MON AB 3-0 SH27 (SUTURE) ×2 IMPLANT
SYR CONTROL 10ML LL (SYRINGE) ×2 IMPLANT
TAPE CLOTH 3X10 TAN LF (GAUZE/BANDAGES/DRESSINGS) ×1 IMPLANT
TAPE CLOTH SURG 6X10 WHT LF (GAUZE/BANDAGES/DRESSINGS) IMPLANT
TOWEL OR 17X24 6PK STRL BLUE (TOWEL DISPOSABLE) ×2 IMPLANT
TOWEL OR NON WOVEN STRL DISP B (DISPOSABLE) IMPLANT
TRAY DSU PREP LF (CUSTOM PROCEDURE TRAY) ×2 IMPLANT
TUBE CONNECTING 20X1/4 (TUBING) ×2 IMPLANT
UNDERPAD 30X30 INCONTINENT (UNDERPADS AND DIAPERS) ×2 IMPLANT
YANKAUER SUCT BULB TIP NO VENT (SUCTIONS) ×2 IMPLANT

## 2013-09-02 NOTE — Anesthesia Preprocedure Evaluation (Addendum)
Anesthesia Evaluation  Patient identified by MRN, date of birth, ID band Patient awake    Reviewed: Allergy & Precautions, H&P , NPO status , Patient's Chart, lab work & pertinent test results  History of Anesthesia Complications Negative for: history of anesthetic complications  Airway        Dental   Pulmonary neg pulmonary ROS,          Cardiovascular negative cardio ROS      Neuro/Psych negative neurological ROS  negative psych ROS   GI/Hepatic   Endo/Other    Renal/GU      Musculoskeletal   Abdominal   Peds  Hematology   Anesthesia Other Findings   Reproductive/Obstetrics                             Anesthesia Physical Anesthesia Plan Anesthesia Quick Evaluation  

## 2013-09-02 NOTE — Anesthesia Postprocedure Evaluation (Signed)
  Anesthesia Post-op Note  Patient: Gabrielle Duncan  Procedure(s) Performed: Procedure(s): HEMORRHOIDECTOMY (N/A)  Patient Location: PACU  Anesthesia Type:General  Level of Consciousness: awake and alert   Airway and Oxygen Therapy: Patient Spontanous Breathing  Post-op Pain: mild  Post-op Assessment: Post-op Vital signs reviewed, Patient's Cardiovascular Status Stable and Respiratory Function Stable  Post-op Vital Signs: Reviewed  Filed Vitals:   09/02/13 0900  BP: 112/70  Pulse: 70  Temp:   Resp: 11    Complications: No apparent anesthesia complications

## 2013-09-02 NOTE — Transfer of Care (Signed)
Immediate Anesthesia Transfer of Care Note  Patient: Gabrielle Duncan  Procedure(s) Performed: Procedure(s): HEMORRHOIDECTOMY (N/A)  Patient Location: PACU  Anesthesia Type:General  Level of Consciousness: sedated  Airway & Oxygen Therapy: Patient Spontanous Breathing and Patient connected to face mask oxygen  Post-op Assessment: Report given to PACU RN and Post -op Vital signs reviewed and stable  Post vital signs: Reviewed and stable  Complications: No apparent anesthesia complications

## 2013-09-02 NOTE — Interval H&P Note (Signed)
History and Physical Interval Note:  09/02/2013 7:03 AM  Gabrielle Duncan  has presented today for surgery, with the diagnosis of hemorrhoids  The various methods of treatment have been discussed with the patient and family. After consideration of risks, benefits and other options for treatment, the patient has consented to  Procedure(s): HEMORRHOIDECTOMY (N/A) as a surgical intervention .  The patient's history has been reviewed, patient examined, no change in status, stable for surgery.  I have reviewed the patient's chart and labs.  Questions were answered to the patient's satisfaction.     Gabrielle Duncan A.

## 2013-09-02 NOTE — H&P (Signed)
Transplants    None    Demographics Gabrielle Duncan 57 year old female  Comm Pref:   [address]  [facility] [place] [address] [phone] (W) [phone] (M) Works at OTHER [[employer]  Problem ListHospitalization ProblemNon-Hospital  Osteoarthritis of right shoulder region  Lipoma of skin and subcutaneous tissue of trunk  Right neck swelling.  Left thyroid nodule  Dysphagia, unspecified(787.20)  Sternoclavicular joint pain  Hemorrhoids  Significant History/Details  Smoking: Never Smoker   Smokeless Tobacco: Never Used  Alcohol: No  4 open orders  Preferred Language: English  Dialysis HistoryNone   Currently admitted as of 12/2/2014Specialty CommentsEditShow AllReport04/08/2013:PT SIGNED HIPPA FOR EDWIN GATTON 04/11/1948 & MICHAEL WARREN ROMINE 12/30/1978 KRISTEN ROMINE 10/[birth year]/1982 DOS: 4/30/14FB-CDS-OP-exc of chest wall mass/gen/KH 01/10/13 01/10/13 WAIVER SIGNED/KH 01/10/2013 patient scheduled for op surgery 01/29/2013 @ CDS no precert required. (tlc,chm)  DOS 08/26/13 TC-CDS-OP- hemorrhoidectomy/gen 46250/et DATE REVISION  07/18/2013 patient scheduled for op surgery 08/26/2013 @ CDS no precert required. (et,chm)  DOS:09/02/13 KH (07/24/13)  08/22/2013 pt surgery 08/26/2013 @ CDS has been rescheduled for a new date of 12/02/20414 @ CDS no change sheet rec'd from [facility]. (chm)    Medications (Discharged within 1 Day(s))Hospital Medications Outpatient Medications  ceFAZolin (ANCEF) 3 g in dextrose 5 % 50 mL IVPB  fentaNYL (SUBLIMAZE) injection 50-100 mcg  lactated ringers infusion  midazolam (VERSED) injection 1-2 mg  sodium phosphate (FLEET) 7-19 GM/118ML enema 1 enema    Preferred Labs   None   Transplant-Related Biopsies (11 years) ** None **  Patient Blood Type (50 years)   None                                 Recent Visits (Maximum of 10 visits)Date Type Provider Description  09/02/2013 Surgery Jovann Luse A., MD   07/18/2013 Office Visit  Harriette Bouillon A., MD Hemorrhoids (Primary Dx)  05/14/2013 Telephone Milas Hock, CMA   04/15/2013 Office Visit Almond Lint, MD Dysphagia, Unspecified(787.20) (Primary Dx); Left Thyroid No...  04/07/2013 Telephone Milas Hock, CMA   03/31/2013 Office Visit Almond Lint, MD Left Thyroid Nodule (Primary Dx); Lipoma of Skin and Subcuta...  02/20/2013 Telephone Gabriel Rainwater, LPN   13/05/6577 Orders Only Almond Lint, MD Neck Swelling (Primary Dx)  02/14/2013 Office Visit Almond Lint, MD Right Neck Swelling. (Primary Dx); Lipoma of Skin and Subcut...  02/06/2013 Telephone Ivory Broad, RN          My Last Outpatient Progress NoteStatus Last Edited Encounter Date  Signed Fri Jul 18, 2013 10:44 AM EDT 07/18/2013  Patient ID: Gabrielle Duncan, female   DOB: [birth year], 57 y.o.   MRN: [id number]    Chief Complaint   Patient presents with   .  New Evaluation       eval bleeding hems      HPI [name] is a 57 y.o. female.  The patient presents with a chief complaint of rectal bleeding. She was seen by Dr. Evette Cristal for rectal bleeding. Colonoscopy is done which shows internal hemorrhoids. She was seen here this past summer for a lipoma that was removed from her chest. She states rectal bleeding is intermittent. It is more noticeable when she wipes. She has had some burning and itching. Suppositories have not helped her symptoms at all for 2 weeks. HPI    Past Medical History   Diagnosis  Date   .  Seasonal allergies     .  Osteoarthritis     .  Mass of chest wall  12/2012       tender to the touch       Past Surgical History   Procedure  Laterality  Date   .  Cholecystectomy       .  Shoulder surgery    03/2012       debridement and partial clavical removal   .  Cervical fusion    2005   .  Anterior cervical decomp/discectomy fusion    08/08/2012       Procedure: ANTERIOR CERVICAL DECOMPRESSION/DISCECTOMY FUSION 2 LEVEL/HARDWARE REMOVAL;  Surgeon: Tia Alert, MD;  Location: [facility]  NEURO ORS;  Service: Neurosurgery;  Laterality: N/A;  anterior cervical five-six to six seven decompression fusion with removal plate four-five   .  Total shoulder arthroplasty    09/24/2012       Procedure: TOTAL SHOULDER ARTHROPLASTY;  Surgeon: Eulas Post, MD;  Location: [facility] ORS;  Service: Orthopedics;  Laterality: Right;   .  Eye surgery           for dry eye syndrome   .  Lumbar laminectomy/decompression microdiscectomy    01/01/2013       L4-5   .  Lumbar fusion    01/01/2013       L4-5   .  Abdominal hysterectomy    1997       complete   .  Mass excision  N/A  01/29/2013       Procedure: EXCISION of chest wall mass 8 cm;  Surgeon: Almond Lint, MD;  Location:  SURGERY CENTER;  Service: General;  Laterality: N/A;      History reviewed. No pertinent family history.   Social History History   Substance Use Topics   .  Smoking status:  Never Smoker    .  Smokeless tobacco:  Never Used   .  Alcohol Use:  No      No Known Allergies    Current Outpatient Prescriptions   Medication  Sig  Dispense  Refill   .  acetaminophen (TYLENOL) 325 MG tablet  Take 650 mg by mouth every 6 (six) hours as needed for pain.         .  DiphenhydrAMINE HCl (BENADRYL ALLERGY PO)  Take by mouth.         .  Ibuprofen (ADVIL PO)  Take by mouth.             No current facility-administered medications for this visit.      Review of Systems Review of Systems  Constitutional: Negative for fever, chills and unexpected weight change.  HENT: Negative for congestion, hearing loss, sore throat, trouble swallowing and voice change.   Eyes: Negative for visual disturbance.  Respiratory: Negative for cough and wheezing.   Cardiovascular: Negative for chest pain, palpitations and leg swelling.  Gastrointestinal: Positive for anal bleeding. Negative for nausea, vomiting, abdominal pain, diarrhea, constipation, blood in stool and abdominal distention.  Genitourinary: Negative for hematuria,  vaginal bleeding and difficulty urinating.  Musculoskeletal: Negative for arthralgias.  Skin: Negative for rash and wound.  Neurological: Negative for seizures, syncope and headaches.  Hematological: Negative for adenopathy. Does not bruise/bleed easily.  Psychiatric/Behavioral: Negative for confusion.    Blood pressure 120/80, pulse 84, temperature 98.6 F (37 C), temperature source Temporal, resp. rate 15, height 5' 6.5" (1.689 m), weight 154 lb 3.2 oz (69.945 kg).   Physical Exam Physical Exam  Constitutional: She is oriented to person, place, and time.  She appears well-developed and well-nourished.  HENT:   Head: Normocephalic and atraumatic.  Eyes: EOM are normal. Pupils are equal, round, and reactive to light.  Neck: Normal range of motion. Neck supple.  Cardiovascular: Normal rate and regular rhythm.   Pulmonary/Chest: Effort normal and breath sounds normal.  Abdominal: Soft.  Genitourinary:     Neurological: She is alert and oriented to person, place, and time.  Skin: Skin is warm and dry.  Psychiatric: She has a normal mood and affect. Her behavior is normal. Thought content normal.    Data Reviewed Colonoscopy shows internal hemorrhoids   Assessment 3 column internal and external hemorrhoid disease with bleeding that has failed medical management   Plan 3 column hemorrhoidectomy. Risk include bleeding, infection, anal stenosis, possible incontinence, change in sensation of anal canal, recurrence, and the need for other operative procedures.The procedure has been discussed with the patient.  Alternative therapies have been discussed with the patient.  Operative risks include bleeding,  Infection,  Organ injury,  Nerve injury,  Blood vessel injury,  DVT,  Pulmonary embolism,  Death,  And possible reoperation.  Medical management risks include worsening of present situation.  The success of the procedure is 50 -90 % at treating patients symptoms.  The patient  understands and agrees to proceed. Given size of hemorrhoids banding or  sclerotherapy would be unsuccessful.       Blakelee Allington A.

## 2013-09-02 NOTE — Op Note (Signed)
Preoperative diagnosis: Grade 3 prolapsed internal hemorrhoids 3 columns  Postoperative diagnosis: Same  Procedure: 3 column internal and external hemorrhoidectomy  Surgeon: Harriette Bouillon M.D.  Anesthesia: LMA with Exparel   EBL: Less than 50 cc  Specimens: Hemorrhoid tissue  Drains: None  IV fluids: 400 cc   Indications for procedure: The patient presents with symptomatic hemorrhoid disease. Options of treatment including medical management, office-based procedures and surgical modalities. All have been discussed with the patient. The patient is failed medical management. They wish to proceed with formal hemorrhoid ectomy. Risks, benefits and all alternative therapies with complications of long term expectations discussed. They agree to proceed with surgery.  Description of procedure: The patient was met in the holding area and questions are answered. The patient was taken back to the operating room and placed upon the operating room table. After induction of LMA anesthesia, the patient was placed lithotomy and appropriately  padded. The anal canal and perineum were prepped and draped in a sterile fashion. Timeout was done and the patient received preoperative antibiotics. Harmonic scalpel was used after digital examination to excise hemorrhoid tissue involving all 3 columns. . Internal sphincter was preserved. The posterior column was  reapproximated with 3-0 Monocryl. All 3 columns were treated in a similar fashion. Hemostasis achieved. Irrigation used. No significant narrowing of the anal canal noted. Surgicel wrapped around Gelfoam used packing. Exparel  infiltrated for perianal block. All final counts are found to be correct. The patient was taken out of  lithotomy, extubated and transported to recovery in satisfactory condition.

## 2013-09-02 NOTE — Anesthesia Procedure Notes (Signed)
Procedure Name: LMA Insertion Date/Time: 09/02/2013 7:36 AM Performed by: Burna Cash Pre-anesthesia Checklist: Patient identified, Emergency Drugs available, Suction available and Patient being monitored Patient Re-evaluated:Patient Re-evaluated prior to inductionOxygen Delivery Method: Circle System Utilized Preoxygenation: Pre-oxygenation with 100% oxygen Intubation Type: IV induction Ventilation: Mask ventilation without difficulty LMA: LMA inserted LMA Size: 4.0 Number of attempts: 1 Airway Equipment and Method: bite block Placement Confirmation: positive ETCO2 Tube secured with: Tape Dental Injury: Teeth and Oropharynx as per pre-operative assessment

## 2013-09-03 ENCOUNTER — Encounter (HOSPITAL_BASED_OUTPATIENT_CLINIC_OR_DEPARTMENT_OTHER): Payer: Self-pay | Admitting: Surgery

## 2013-09-03 ENCOUNTER — Telehealth (INDEPENDENT_AMBULATORY_CARE_PROVIDER_SITE_OTHER): Payer: Self-pay | Admitting: *Deleted

## 2013-09-03 NOTE — Telephone Encounter (Signed)
LMOM for pt to inform her of her follow up appt information to come back in and see Dr. Luisa Hart on 12/23 at 9:10am.

## 2013-09-23 ENCOUNTER — Encounter (INDEPENDENT_AMBULATORY_CARE_PROVIDER_SITE_OTHER): Payer: Self-pay | Admitting: Surgery

## 2013-09-23 ENCOUNTER — Ambulatory Visit (INDEPENDENT_AMBULATORY_CARE_PROVIDER_SITE_OTHER): Payer: BC Managed Care – PPO | Admitting: Surgery

## 2013-09-23 VITALS — BP 133/89 | HR 77 | Temp 97.4°F | Resp 16 | Ht 67.0 in | Wt 160.0 lb

## 2013-09-23 DIAGNOSIS — Z9889 Other specified postprocedural states: Secondary | ICD-10-CM

## 2013-09-23 NOTE — Progress Notes (Signed)
Patient returns after 3 column hemorrhoidectomy 3 weeks ago. She had some pain issues once the local wore off. She is doing okay. Occasional bleeding noted with bowel movements.  Exam: Anal canal clean dry and intact with no signs of infection.   Impression: Status post 3 column hemorrhoidectomy  Plan: Resume activity as tolerated. Return to office 1 month for recheck

## 2013-09-23 NOTE — Patient Instructions (Signed)
Return one month.  Expect occasional bleeding.  OK to let stool form solid.

## 2013-09-29 ENCOUNTER — Ambulatory Visit (INDEPENDENT_AMBULATORY_CARE_PROVIDER_SITE_OTHER): Payer: BC Managed Care – PPO | Admitting: Emergency Medicine

## 2013-09-29 ENCOUNTER — Encounter: Payer: Self-pay | Admitting: Emergency Medicine

## 2013-09-29 VITALS — BP 124/70 | HR 68 | Temp 98.2°F | Resp 18 | Ht 64.0 in | Wt 162.0 lb

## 2013-09-29 DIAGNOSIS — Z79899 Other long term (current) drug therapy: Secondary | ICD-10-CM

## 2013-09-29 DIAGNOSIS — R209 Unspecified disturbances of skin sensation: Secondary | ICD-10-CM

## 2013-09-29 DIAGNOSIS — E559 Vitamin D deficiency, unspecified: Secondary | ICD-10-CM

## 2013-09-29 DIAGNOSIS — R0602 Shortness of breath: Secondary | ICD-10-CM

## 2013-09-29 DIAGNOSIS — R202 Paresthesia of skin: Secondary | ICD-10-CM

## 2013-09-29 DIAGNOSIS — M2550 Pain in unspecified joint: Secondary | ICD-10-CM

## 2013-09-29 DIAGNOSIS — R5381 Other malaise: Secondary | ICD-10-CM

## 2013-09-29 DIAGNOSIS — R635 Abnormal weight gain: Secondary | ICD-10-CM

## 2013-09-29 DIAGNOSIS — Z833 Family history of diabetes mellitus: Secondary | ICD-10-CM

## 2013-09-29 DIAGNOSIS — R5383 Other fatigue: Secondary | ICD-10-CM

## 2013-09-29 LAB — BASIC METABOLIC PANEL WITH GFR
BUN: 6 mg/dL (ref 6–23)
Chloride: 108 mEq/L (ref 96–112)
Creat: 0.65 mg/dL (ref 0.50–1.10)
GFR, Est African American: >89 mL/min
GFR, Est Non African American: >89 mL/min

## 2013-09-29 LAB — CBC WITH DIFFERENTIAL/PLATELET
Basophils Absolute: 0 10*3/uL (ref 0.0–0.1)
HCT: 36.1 % (ref 36.0–46.0)
Hemoglobin: 12.4 g/dL (ref 12.0–15.0)
Lymphocytes Relative: 20 % (ref 12–46)
MCHC: 34.3 g/dL (ref 30.0–36.0)
Monocytes Absolute: 0.4 10*3/uL (ref 0.1–1.0)
Monocytes Relative: 7 % (ref 3–12)
Neutro Abs: 4.5 10*3/uL (ref 1.7–7.7)
Neutrophils Relative %: 69 % (ref 43–77)
RBC: 4.04 MIL/uL (ref 3.87–5.11)
WBC: 6.6 10*3/uL (ref 4.0–10.5)

## 2013-09-29 LAB — HEPATIC FUNCTION PANEL
Albumin: 4 g/dL (ref 3.5–5.2)
Alkaline Phosphatase: 62 U/L (ref 39–117)
Indirect Bilirubin: 0.2 mg/dL (ref 0.0–0.9)
Total Bilirubin: 0.3 mg/dL (ref 0.3–1.2)
Total Protein: 6.4 g/dL (ref 6.0–8.3)

## 2013-09-29 LAB — TSH: TSH: 0.991 u[IU]/mL (ref 0.350–4.500)

## 2013-09-29 LAB — HEMOGLOBIN A1C
Hgb A1c MFr Bld: 6 % — ABNORMAL HIGH (ref <5.7)
Mean Plasma Glucose: 126 mg/dL — ABNORMAL HIGH (ref <117)

## 2013-09-29 LAB — URIC ACID: Uric Acid, Serum: 3.6 mg/dL (ref 2.4–7.0)

## 2013-09-29 LAB — MAGNESIUM: Magnesium: 1.8 mg/dL (ref 1.5–2.5)

## 2013-09-29 LAB — SEDIMENTATION RATE: Sed Rate: 4 mm/hr (ref 0–22)

## 2013-09-29 LAB — IRON AND TIBC
%SAT: 17 % — ABNORMAL LOW (ref 20–55)
Iron: 64 ug/dL (ref 42–145)
UIBC: 309 ug/dL (ref 125–400)

## 2013-09-29 NOTE — Patient Instructions (Signed)
Neuropathic Pain We often think that pain has a physical cause. If we get rid of the cause, the pain should go away. Nerves themselves can also cause pain. It is called neuropathic pain, which means nerve abnormality. It may be difficult for the patients who have it and for the treating caregivers. Pain is usually described as acute (short-lived) or chronic (long-lasting). Acute pain is related to the physical sensations caused by an injury. It can last from a few seconds to many weeks, but it usually goes away when normal healing occurs. Chronic pain lasts beyond the typical healing time. With neuropathic pain, the nerve fibers themselves may be damaged or injured. They then send incorrect signals to other pain centers. The pain you feel is real, but the cause is not easy to find.  CAUSES  Chronic pain can result from diseases, such as diabetes and shingles (an infection related to chickenpox), or from trauma, surgery, or amputation. It can also happen without any known injury or disease. The nerves are sending pain messages, even though there is no identifiable cause for such messages.   Other common causes of neuropathy include diabetes, phantom limb pain, or Regional Pain Syndrome (RPS).  As with all forms of chronic back pain, if neuropathy is not correctly treated, there can be a number of associated problems that lead to a downward cycle for the patient. These include depression, sleeplessness, feelings of fear and anxiety, limited social interaction and inability to do normal daily activities or work.  The most dramatic and mysterious example of neuropathic pain is called "phantom limb syndrome." This occurs when an arm or a leg has been removed because of illness or injury. The brain still gets pain messages from the nerves that originally carried impulses from the missing limb. These nerves now seem to misfire and cause troubling pain.  Neuropathic pain often seems to have no cause. It responds  poorly to standard pain treatment. Neuropathic pain can occur after:  Shingles (herpes zoster virus infection).  A lasting burning sensation of the skin, caused usually by injury to a peripheral nerve.  Peripheral neuropathy which is widespread nerve damage, often caused by diabetes or alcoholism.  Phantom limb pain following an amputation.  Facial nerve problems (trigeminal neuralgia).  Multiple sclerosis.  Reflex sympathetic dystrophy.  Pain which comes with cancer and cancer chemotherapy.  Entrapment neuropathy such as when pressure is put on a nerve such as in carpal tunnel syndrome.  Back, leg, and hip problems (sciatica).  Spine or back surgery.  HIV Infection or AIDS where nerves are infected by viruses. Your caregiver can explain items in the above list which may apply to you. SYMPTOMS  Characteristics of neuropathic pain are:  Severe, sharp, electric shock-like, shooting, lightening-like, knife-like.  Pins and needles sensation.  Deep burning, deep cold, or deep ache.  Persistent numbness, tingling, or weakness.  Pain resulting from light touch or other stimulus that would not usually cause pain.  Increased sensitivity to something that would normally cause pain, such as a pinprick. Pain may persist for months or years following the healing of damaged tissues. When this happens, pain signals no longer sound an alarm about current injuries or injuries about to happen. Instead, the alarm system itself is not working correctly.  Neuropathic pain may get worse instead of better over time. For some people, it can lead to serious disability. It is important to be aware that severe injury in a limb can occur without a proper, protective pain   response.Burns, cuts, and other injuries may go unnoticed. Without proper treatment, these injuries can become infected or lead to further disability. Take any injury seriously, and consult your caregiver for treatment. DIAGNOSIS    When you have a pain with no known cause, your caregiver will probably ask some specific questions:   Do you have any other conditions, such as diabetes, shingles, multiple sclerosis, or HIV infection?  How would you describe your pain? (Neuropathic pain is often described as shooting, stabbing, burning, or searing.)  Is your pain worse at any time of the day? (Neuropathic pain is usually worse at night.)  Does the pain seem to follow a certain physical pathway?  Does the pain come from an area that has missing or injured nerves? (An example would be phantom limb pain.)  Is the pain triggered by minor things such as rubbing against the sheets at night? These questions often help define the type of pain involved. Once your caregiver knows what is happening, treatment can begin. Anticonvulsant, antidepressant drugs, and various pain relievers seem to work in some cases. If another condition, such as diabetes is involved, better management of that disorder may relieve the neuropathic pain.  TREATMENT  Neuropathic pain is frequently long-lasting and tends not to respond to treatment with narcotic type pain medication. It may respond well to other drugs such as antiseizure and antidepressant medications. Usually, neuropathic problems do not completely go away, but partial improvement is often possible with proper treatment. Your caregivers have large numbers of medications available to treat you. Do not be discouraged if you do not get immediate relief. Sometimes different medications or a combination of medications will be tried before you receive the results you are hoping for. See your caregiver if you have pain that seems to be coming from nowhere and does not go away. Help is available.  SEEK IMMEDIATE MEDICAL CARE IF:   There is a sudden change in the quality of your pain, especially if the change is on only one side of the body.  You notice changes of the skin, such as redness, black or  purple discoloration, swelling, or an ulcer.  You cannot move the affected limbs. Document Released: 06/15/2004 Document Revised: 12/11/2011 Document Reviewed: 06/15/2004 [facility] Patient Information 2014 [place], [place]. Arthritis, Nonspecific Arthritis is pain, redness, warmth, or puffiness (inflammation) of a joint. The joint may be stiff or hurt when you move it. One or more joints may be affected. There are many types of arthritis. Your doctor may not know what type you have right away. The most common cause of arthritis is wear and tear on the joint (osteoarthritis). HOME CARE   Only take medicine as told by your doctor.  Rest the joint as much as possible.  Raise (elevate) your joint if it is puffy.  Use crutches if the painful joint is in your leg.  Drink enough fluids to keep your pee (urine) clear or pale yellow.  Follow your doctor's diet instructions.  Use cold packs for very bad joint pain for 10 to 15 minutes every hour. Ask your doctor if it is okay for you to use hot packs.  Exercise as told by your doctor.  Take a warm shower if you have stiffness in the morning.  Move your sore joints throughout the day. GET HELP RIGHT AWAY IF:   You have a fever.  You have very bad joint pain, puffiness, or redness.  You have many joints that are painful and puffy.  You  are not getting better with treatment.  You have very bad back pain or leg weakness.  You cannot control when you poop (bowel movement) or pee (urinate).  You do not feel better in 24 hours or are getting worse.  You are having side effects from your medicine. MAKE SURE YOU:   Understand these instructions.  Will watch your condition.  Will get help right away if you are not doing well or get worse. Document Released: 12/13/2009 Document Revised: 03/19/2012 Document Reviewed: 12/13/2009 [facility] Patient Information 2014 [place], [place].

## 2013-09-29 NOTE — Progress Notes (Signed)
Subjective:    Patient ID: Gabrielle Duncan, female    DOB: [birth year], 57 y.o.   MRN: [id number]  HPI Comments: 57 yo female with multiple concerns.She is concerned with weight gain since surgery with out changing diet. She notes she has maintained same level of activity since before surgery. She is having rash/ skin cancer treated at dermatology. She is feeling like pins and needles all over body, she notes those symptoms were present before skin treatment started. She is concerned about diabetes or neuropathy. She notes father had both disease states. She has strong personal history of osteoarthritis and notes chronic arthralgias. She is having hard foot cramps and notes it is hard to straighten feet when camping. She feels bloated all over. She notes mild discomfort with SOB with increased abdominal bloating. She notes occasional swelling of both LE on/ off. She has been feeling more fatigued since surgery and weight gain.     Current Outpatient Prescriptions on File Prior to Visit  Medication Sig Dispense Refill  . cetirizine (ZYRTEC) 10 MG tablet Take 10 mg by mouth daily.      . DiphenhydrAMINE HCl (BENADRYL ALLERGY PO) Take by mouth.      . Ibuprofen (ADVIL PO) Take by mouth.      . lidocaine (XYLOCAINE JELLY) 2 % jelly Apply 1 application topically as needed.  30 mL  0   No current facility-administered medications on file prior to visit.    Review of patient's allergies indicates no known allergies.  Past Medical History  Diagnosis Date  . Seasonal allergies   . Osteoarthritis   . Mass of chest wall 12/2012    tender to the touch    Review of Systems  Constitutional: Positive for fatigue.  Respiratory: Positive for shortness of breath.   Cardiovascular: Positive for leg swelling.  Musculoskeletal: Positive for arthralgias.  Skin: Positive for rash.  Neurological: Positive for numbness.  All other systems reviewed and are negative.   BP 124/70  Pulse 68  Temp(Src) 98.2  F (36.8 C) (Temporal)  Resp 18  Ht 5\' 4"  (1.626 m)  Wt 162 lb (73.483 kg)  BMI 27.79 kg/m2     Objective:   Physical Exam  Nursing note and vitals reviewed. Constitutional: She is oriented to person, place, and time. She appears well-developed and well-nourished. No distress.  HENT:  Head: Normocephalic and atraumatic.  Right Ear: External ear normal.  Left Ear: External ear normal.  Nose: Nose normal.  Mouth/Throat: Oropharynx is clear and moist.  Eyes: Conjunctivae and EOM are normal.  Neck: Normal range of motion. Neck supple. No JVD present. No thyromegaly present.  Cardiovascular: Normal rate, regular rhythm, normal heart sounds and intact distal pulses.   Pulmonary/Chest: Effort normal and breath sounds normal.  Abdominal: Soft. Bowel sounds are normal. She exhibits distension. She exhibits no mass. There is no tenderness. There is no rebound and no guarding.  mild  Musculoskeletal: Normal range of motion. She exhibits no edema and no tenderness.  Lymphadenopathy:    She has no cervical adenopathy.  Neurological: She is alert and oriented to person, place, and time. No cranial nerve deficit.  Skin: Skin is warm and dry. No rash noted. No erythema. No pallor.  Psychiatric: She has a normal mood and affect. Her behavior is normal. Judgment and thought content normal.  Talks excessively          Assessment & Plan:  Multiple complaints and concerns from patient with sporatic history. OVER  40 minutes of exam, counseling, chart review, referral performed 1. WT gain since recent surgery- Check labs, diet/ exercise discussed 2. Tingling/ Arthralgias- Check labs 3. Fatigue- check labs, increase activity and H2O 4. SOB- Recent Surgery, check labs, may need CXR, ER if SX increase

## 2013-10-27 ENCOUNTER — Ambulatory Visit (INDEPENDENT_AMBULATORY_CARE_PROVIDER_SITE_OTHER): Payer: BC Managed Care – PPO | Admitting: Surgery

## 2013-10-27 ENCOUNTER — Encounter (INDEPENDENT_AMBULATORY_CARE_PROVIDER_SITE_OTHER): Payer: Self-pay | Admitting: Surgery

## 2013-10-27 VITALS — BP 122/80 | HR 72 | Temp 98.2°F | Resp 14 | Ht 66.0 in | Wt 157.2 lb

## 2013-10-27 DIAGNOSIS — Z9889 Other specified postprocedural states: Secondary | ICD-10-CM

## 2013-10-27 NOTE — Progress Notes (Signed)
Patient returns after 3 column hemorrhoidectomy 3 weeks ago. She had some pain issues once the local wore off. She is doing okay. Occasional bleeding noted with bowel movements.  Exam: Anal canal clean dry and intact with no signs of infection. Slight stenosis on rectal exam  Impression: Status post 3 column hemorrhoidectomy  Plan: Resume activity as tolerated. Return to office prn.  High fiber diet

## 2013-10-27 NOTE — Patient Instructions (Signed)
High fiber diet. Return as needed.

## 2013-12-06 DIAGNOSIS — T7840XA Allergy, unspecified, initial encounter: Secondary | ICD-10-CM | POA: Insufficient documentation

## 2013-12-12 ENCOUNTER — Encounter: Payer: Self-pay | Admitting: Internal Medicine

## 2013-12-12 ENCOUNTER — Ambulatory Visit (INDEPENDENT_AMBULATORY_CARE_PROVIDER_SITE_OTHER): Payer: BC Managed Care – PPO | Admitting: Internal Medicine

## 2013-12-12 VITALS — BP 108/78 | HR 72 | Temp 98.2°F | Resp 16 | Ht 64.0 in | Wt 159.0 lb

## 2013-12-12 DIAGNOSIS — R7309 Other abnormal glucose: Secondary | ICD-10-CM | POA: Insufficient documentation

## 2013-12-12 DIAGNOSIS — Z79899 Other long term (current) drug therapy: Secondary | ICD-10-CM

## 2013-12-12 DIAGNOSIS — J209 Acute bronchitis, unspecified: Secondary | ICD-10-CM

## 2013-12-12 DIAGNOSIS — J019 Acute sinusitis, unspecified: Secondary | ICD-10-CM

## 2013-12-12 LAB — CBC WITH DIFFERENTIAL/PLATELET
Basophils Absolute: 0.1 K/uL (ref 0.0–0.1)
Basophils Relative: 1 % (ref 0–1)
Eosinophils Absolute: 0.6 K/uL (ref 0.0–0.7)
Eosinophils Relative: 10 % — ABNORMAL HIGH (ref 0–5)
HCT: 36.6 % (ref 36.0–46.0)
Hemoglobin: 12.7 g/dL (ref 12.0–15.0)
Lymphocytes Relative: 26 % (ref 12–46)
Lymphs Abs: 1.5 K/uL (ref 0.7–4.0)
MCH: 30.3 pg (ref 26.0–34.0)
MCHC: 34.7 g/dL (ref 30.0–36.0)
MCV: 87.4 fL (ref 78.0–100.0)
Monocytes Absolute: 0.7 K/uL (ref 0.1–1.0)
Monocytes Relative: 12 % (ref 3–12)
Neutro Abs: 2.9 K/uL (ref 1.7–7.7)
Neutrophils Relative %: 51 % (ref 43–77)
Platelets: 263 K/uL (ref 150–400)
RBC: 4.19 MIL/uL (ref 3.87–5.11)
RDW: 13.6 % (ref 11.5–15.5)
WBC: 5.7 K/uL (ref 4.0–10.5)

## 2013-12-12 LAB — HEMOGLOBIN A1C
Hgb A1c MFr Bld: 5.8 % — ABNORMAL HIGH (ref <5.7)
Mean Plasma Glucose: 120 mg/dL — ABNORMAL HIGH (ref <117)

## 2013-12-12 MED ORDER — AZITHROMYCIN 250 MG PO TABS: ORAL_TABLET | ORAL | Status: DC

## 2013-12-12 MED ORDER — PHENTERMINE HCL 37.5 MG PO TABS: ORAL_TABLET | ORAL | Status: DC

## 2013-12-12 MED ORDER — PREDNISONE 20 MG PO TABS: ORAL_TABLET | ORAL | Status: DC

## 2013-12-12 MED ORDER — HYDROCODONE-ACETAMINOPHEN 5-325 MG PO TABS: ORAL_TABLET | ORAL | Status: AC

## 2013-12-12 NOTE — Patient Instructions (Signed)
Bronchitis Bronchitis is inflammation of the airways that extend from the windpipe into the lungs (bronchi). The inflammation often causes mucus to develop, which leads to a cough. If the inflammation becomes severe, it may cause shortness of breath. CAUSES  Bronchitis may be caused by:   Viral infections.   Bacteria.   Cigarette smoke.   Allergens, pollutants, and other irritants.  SIGNS AND SYMPTOMS  The most common symptom of bronchitis is a frequent cough that produces mucus. Other symptoms include:  Fever.   Body aches.   Chest congestion.   Chills.   Shortness of breath.   Sore throat.  DIAGNOSIS  Bronchitis is usually diagnosed through a medical history and physical exam. Tests, such as chest X-rays, are sometimes done to rule out other conditions.  TREATMENT  You may need to avoid contact with whatever caused the problem (smoking, for example). Medicines are sometimes needed. These may include:  Antibiotics. These may be prescribed if the condition is caused by bacteria.  Cough suppressants. These may be prescribed for relief of cough symptoms.   Inhaled medicines. These may be prescribed to help open your airways and make it easier for you to breathe.   Steroid medicines. These may be prescribed for those with recurrent (chronic) bronchitis. HOME CARE INSTRUCTIONS  Get plenty of rest.   Drink enough fluids to keep your urine clear or pale yellow (unless you have a medical condition that requires fluid restriction). Increasing fluids may help thin your secretions and will prevent dehydration.   Only take over-the-counter or prescription medicines as directed by your health care provider.  Only take antibiotics as directed. Make sure you finish them even if you start to feel better.  Avoid secondhand smoke, irritating chemicals, and strong fumes. These will make bronchitis worse. If you are a smoker, quit smoking. Consider using nicotine gum or  skin patches to help control withdrawal symptoms. Quitting smoking will help your lungs heal faster.   Put a cool-mist humidifier in your bedroom at night to moisten the air. This may help loosen mucus. Change the water in the humidifier daily. You can also run the hot water in your shower and sit in the bathroom with the door closed for 5 10 minutes.   Follow up with your health care provider as directed.   Wash your hands frequently to avoid catching bronchitis again or spreading an infection to others.  SEEK MEDICAL CARE IF: Your symptoms do not improve after 1 week of treatment.  SEEK IMMEDIATE MEDICAL CARE IF:  Your fever increases.  You have chills.   You have chest pain.   You have worsening shortness of breath.   You have bloody sputum.  You faint.  You have lightheadedness.  You have a severe headache.   You vomit repeatedly. MAKE SURE YOU:   Understand these instructions.  Will watch your condition.  Will get help right away if you are not doing well or get worse. Document Released: 09/18/2005 Document Revised: 07/09/2013 Document Reviewed: 05/13/2013 [facility] Patient Information 2014 [place]. Diabetes, Type 2, Am I At Risk? Diabetes is a lasting (chronic) disease. In type 2 diabetes, the pancreas does not make enough insulin, and the body does not respond normally to the insulin that is made. This type of diabetes was also previously called adult onset diabetes. About 90% of all those who have diabetes have type 2. It usually occurs after the age of 44, but can occur at any age.  People develop type  2 diabetes because they do not use insulin properly. Eventually, the pancreas cannot make enough insulin for the body's needs. Over time, the amount of glucose (sugar) in the blood increases. RISK FACTORS  Overweight  the more weight you have, the more resistant your cells become to insulin.  Family history  you are more likely to get diabetes if a  parent or sibling has diabetes.  Race certain races get diabetes more.  African Americans.  American Indians.  Asian Americans.  Hispanics.  Pacific Islander.  Inactive exercise helps control weight and helps your cells be more sensitive to insulin.  Gestational diabetes  some women develop diabetes while they are pregnant. This goes away when they deliver. However, they are 50-60% more likely to develop type 2 diabetes at a later time.  Having a baby over 9 pounds  a sign that you may have had gestational diabetes.  Age the risk of diabetes goes up as you get older, especially after age 64.  High blood pressure (hypertension). SYMPTOMS Many people have no signs or symptoms. Symptoms can be so mild that you might not even notice them. Some of these signs are:  Increased thirst.  Increased hunger.  Tiredness (fatigue).  Increased urination, especially at night.  Weight loss.  Blurred vision.  Sores that do not heal. WHO SHOULD BE TESTED?  Anyone 59 years or older, especially if overweight, should consider getting tested.  If you are younger than 13, overweight, and have one or more of the risk factors, you should consider getting tested. DIAGNOSIS  Fasting blood glucose (FBS). Usually, 2 are done.  FBS 101-125 mg/dl is considered pre-diabetes.  FBS 126 mg/dl or greater is considered diabetes.  2 hour Oral Glucose Tolerance Test (OGTT). This test is preformed by first having you not eat or drink for several hours. You are then given something sweet to drink and your blood glucose is measured fasting, at one hour and 2 hours. This test tells how well you are able to handle sugars or carbohydrates.  Fasting: 60-100 mg/dl.  1 hour: less than 200 mg/dl.  2 hours: less than 140 mg/dl.  A1c A1c is a blood glucose test that gives and average of your blood glucose over 3 months. It is the accepted method to use to diagnose diabetes.  A1c 5.7-6.4% is considered  pre-diabetes.  A1c 6.5% or greater is considered diabetes. WHAT DOES IT MEAN TO HAVE PRE-DIABETES? Pre-diabetes means you are at risk for getting type 2 diabetes. Your blood glucose is higher than normal, but not yet high enough to diagnose diabetes. The good news is, if you have pre-diabetes you can reduce the risk of getting diabetes and even return to normal blood glucose levels. With modest weight loss and moderate physical activity, you can delay or prevent type 2 diabetes.  PREVENTION You cannot do anything about race, age or family history, but you can lower your chances of getting diabetes. You can:   Exercise regularly and be active.  Reduce fat and calorie intake.  Make wise food choices as much as you can.  Reduce your intake of salt and alcohol.  Maintain a reasonable weight.  Keep blood pressure in an acceptable range. Take medication if needed.  Not smoke.  Maintain an acceptable cholesterol level (HDL, LDL, Triglycerides). Take medication if needed. DOING MY PART: GETTING STARTED Making big changes in your life is hard, especially if you are faced with more than one change. You can make it easier by  taking these steps:  Make a plan to change behavior.  Decide exactly what you will do and when you will do it.  Plan what you need to get ready.  Think about what might prevent you from reaching your goals.  Find family and friends who will support and encourage you.  Decide how you will reward yourself when you do what you have planned.  Your doctor, dietitian, or counselor can help you make a plan. HERE ARE SOME OF THE AREAS YOU MAY WISH TO CHANGE TO REDUCE YOUR RISK OF DIABETES. If you are overweight or obese, choose sensible ways to get in shape. Even small amounts of weight loss, like 5-10 pounds, can help reduce the effects of insulin resistance and help blood glucose control. Diet  Avoid crash diets. Instead, eat less of the foods you usually have. Limit  the amount of fat you eat.  Increase your physical activity. Aim for at least 30 minutes of exercise most days of the week.  Set a reasonable weight-loss goal, such as losing 1 pound a week. Aim for a long-term goal of losing 5-7% of your total body weight.  Make wise food choices most of the time.  What you eat has a big impact on your health. By making wise food choices, you can help control your body weight, blood pressure, and cholesterol.  Take a hard look at the serving sizes of the foods you eat. Reduce serving sizes of meat, desserts, and foods high in fat. Increase your intake of fruits and vegetables.  Limit your fat intake to about 25% of your total calories. For example, if your food choices add up to about 2,000 calories a day, try to eat no more than 56 grams of fat. Your caregiver or a dietitian can help you figure out how much fat to have. You can check food labels for fat content too.  You may also want to reduce the number of calories you have each day.  Keep a food log. Write down what you eat, how much you eat, and anything else that helps keep you on track.  When you meet your goal, reward yourself with a nonfood item or activity. Exercise  Be physically active every day.  Keep and exercise log. Write down what exercise you did, for how long, and anything else that keeps you on track.  Regular exercise (like brisk walking) tackles several risk factors at once. It helps you lose weight, it keeps your cholesterol and blood pressure under control, and it helps your body use insulin. People who are physically active for 30 minutes a day, 5 days a week, reduced their risk of type 2 diabetes. If you are not very active, you should start slowly at first. Talk with your caregiver first about what kinds of exercise would be safe for you. Make a plan to increase your activity level with the goal of being active for at least 30 minutes a day, most days of the week.  Choose  activities you enjoy. Here are some ways to work extra activity into your daily routine:  Take the stairs rather than an elevator or escalator.  Park at the far end of the lot and walk.  Get off the bus a few stops early and walk the rest of the way.  Walk or bicycle instead of drive whenever you can. Medications Some people need medication to help control their blood pressure or cholesterol levels. If you do, take your medicines as directed.  Ask your caregiver whether there are any medicines you can take to prevent type 2 diabetes. Document Released: 09/21/2003 Document Revised: 12/11/2011 Document Reviewed: 06/16/2009 [facility] Patient Information 2014 [place].   Insulin Resistance Blood sugar (glucose) levels are controlled by a hormone called insulin. Insulin is made by your pancreas. When your blood glucose goes up, insulin is released into your blood. Insulin is required for your body to function normally. However, your body can become resistant to your own insulin or to insulin given to treat diabetes. In either case, insulin resistance can lead to serious problems. These problems include:  Type 2 diabetes.  Heart disease.  High blood pressure.  Stroke.  Polycystic ovary syndrome.  Fatty liver. CAUSES  Insulin resistance can develop for many different reasons. It is more likely to happen in people with these conditions or characteristics:  Obesity.  Inactivity.  Pregnancy.  High blood pressure.  Stress.  Steroid use.  Infection or severe illness.  Increased levels of cholesterol and triglycerides. SYMPTOMS  There are no symptoms. You may have symptoms related to the various complications of insulin resistance.  DIAGNOSIS  Several different things can make your caregiver suspect you have insulin resistance. These include:  High blood glucose (hyperglycemia).  Abnormal cholesterol levels.  High uric acid levels.  Changes related to blood  pressure.  Changes related to inflammation. Insulin resistance can be determined with blood tests. An elevated insulin level when you have not eaten might suggest resistance. Other more complicated tests are sometimes necessary. TREATMENT  Lifestyle changes are the most important treatment for insulin resistance.   If you are overweight and you have insulin resistance, you can improve your insulin sensitivity by losing weight.  Moderate exercise for 30 40 minutes, 4 days a week, can improve insulin sensitivity. Some medicines can also help improve your insulin sensitivity. Your caregiver can discuss these with you if they are appropriate.  HOME CARE INSTRUCTIONS   Do not smoke.  Keep your weight at a healthy level.  Get exercise.  If you have diabetes, follow your caregiver's directions.  If you have high blood pressure, follow your caregiver's directions.  Only take prescription medicines for pain, fever, or discomfort as directed by your caregiver. SEEK MEDICAL CARE IF:   You are diabetic and you are having problems keeping your blood glucose levels at target range.  You are having episodes of low blood glucose (hypoglycemia).  You feel you might be having side effects from your medicines.  You have symptoms of an illness that is not improving after 3 4 days.  You have a sore or wound that is not healing.  You notice a change in vision or a new problem with your vision. SEEK IMMEDIATE MEDICAL CARE IF:   Your blood glucose goes below 70, especially if you have confusion, lightheadedness, or other symptoms with it.  Your blood glucose is very high (as advised by your caregiver) twice in a row.  You pass out.  You have chest pain or trouble breathing.  You have a sudden, severe headache.  You have sudden weakness in one arm or one leg.  You have sudden difficulty speaking or swallowing.  You develop vomiting or diarrhea that is getting worse or not improving after  1 day. Document Released: 11/07/2005 Document Revised: 03/19/2012 Document Reviewed: 02/27/2013 [facility] Patient Information 2014 [place], [place].

## 2013-12-13 ENCOUNTER — Encounter: Payer: Self-pay | Admitting: Internal Medicine

## 2013-12-13 LAB — INSULIN, FASTING: Insulin fasting, serum: 6 u[IU]/mL (ref 3–28)

## 2013-12-13 NOTE — Progress Notes (Signed)
Subjective:    Patient ID: Gabrielle Duncan, female    DOB: 03-09-1956, 58 y.o.   MRN: [id number]  Sinusitis This is a new problem. The current episode started in the past 7 days. The problem has been waxing and waning since onset. There has been no fever. The pain is mild. Associated symptoms include congestion, coughing, headaches, sinus pressure and a sore throat. Pertinent negatives include no chills, diaphoresis, ear pain, hoarse voice, neck pain, shortness of breath, sneezing or swollen glands. Past treatments include nothing.   Patient also has Hx/o PreDiabetes with A1c 6.0% in Dec 2014 and she has gained about 10 # since that time.She denies any diabetic polys, paresthesias or visual blurring.  Medication Sig  . cetirizine (ZYRTEC) 10 MG tablet Take 10 mg by mouth daily.  . DiphenhydrAMINE HCl (BENADRYL ALLERGY PO) Take by mouth.  . Ibuprofen (ADVIL PO) Take by mouth.   No Known Allergies  Past Medical History  Diagnosis Date  . Seasonal allergies   . Mass of chest wall 12/2012    tender to the touch  . Allergy   . Osteoarthritis     Review of Systems  Constitutional: Negative for chills and diaphoresis.  HENT: Positive for congestion, postnasal drip, sinus pressure and sore throat. Negative for ear pain, hoarse voice and sneezing.   Eyes: Negative.   Respiratory: Positive for cough. Negative for chest tightness, shortness of breath, wheezing and stridor.   Cardiovascular: Negative.   Gastrointestinal: Negative.   Endocrine: Negative.   Genitourinary: Negative.   Musculoskeletal: Negative.  Negative for neck pain.  Neurological: Positive for headaches.   BP 108/78  Pulse 72  Temp(Src) 98.2 F (36.8 C) (Temporal)  Resp 16  Ht 5\' 4"  (1.626 m)  Wt 159 lb (72.122 kg)  BMI 27.28 kg/m2  Objective:   Physical Exam  Constitutional: She is oriented to person, place, and time. She appears well-nourished. No distress.  HENT:  Right Ear: External ear normal.  Nose: Nose  normal.  Mouth/Throat: No oropharyngeal exudate.  Bilat Frontal and maxillary tenderness.  Eyes: EOM are normal.  Neck: Normal range of motion. Neck supple. No JVD present.  Cardiovascular: Normal rate, regular rhythm and normal heart sounds.   No murmur heard. Pulmonary/Chest: Effort normal. No respiratory distress. She has no wheezes. She has rales.  Musculoskeletal: Normal range of motion.  Lymphadenopathy:    She has no cervical adenopathy.  Neurological: She is alert and oriented to person, place, and time. She has normal reflexes. No cranial nerve deficit. Coordination normal.  Skin: Skin is warm and dry. No rash noted. No erythema. No pallor.  Psychiatric: She has a normal mood and affect. Thought content normal.  Pressured speech   Assessment & Plan:   1. PreDiabetes - phentermine (ADIPEX-P) 37.5 MG tablet; Take 1/2 to 1 tablet every morning for dieting and weight control  Dispense: 30 tablet; Refill: 2 - Hemoglobin A1c - Insulin, fasting - Discussed dieting and weight control  2. Acute bronchitis - CBC with Differential  - azithromycin (ZITHROMAX) 250 MG tablet; Take 2 tablets (500 mg) on  Day 1,  followed by 1 tablet (250 mg) once daily on Days 2 through 5.  Dispense: 6 each; Refill: 1 - predniSONE (DELTASONE) 20 MG tablet; 1 tab 3 x day for 2 days, then 1 tab 2 x day for 2 days, then 1 tab 1 x day for 3 days  Dispense: 13 tablet; Refill: 0 - HYDROcodone-acetaminophen (NORCO) 5-325 MG per  tablet; Take 1/2 to 1 tablet every 3 to 4 hours as needed for cough or pain  Dispense: 30 tablet; Refill: 0  3. Acute sinusitis

## 2014-03-18 ENCOUNTER — Ambulatory Visit: Payer: Self-pay | Admitting: Internal Medicine

## 2014-03-24 ENCOUNTER — Ambulatory Visit: Payer: Self-pay | Admitting: Internal Medicine

## 2014-03-25 ENCOUNTER — Ambulatory Visit: Payer: Self-pay | Admitting: Internal Medicine

## 2014-04-07 ENCOUNTER — Encounter: Payer: Self-pay | Admitting: Internal Medicine

## 2014-04-07 ENCOUNTER — Ambulatory Visit (INDEPENDENT_AMBULATORY_CARE_PROVIDER_SITE_OTHER): Payer: BC Managed Care – PPO | Admitting: Physician Assistant

## 2014-04-07 VITALS — BP 120/80 | HR 80 | Temp 97.7°F | Resp 16 | Ht 65.0 in | Wt 158.0 lb

## 2014-04-07 DIAGNOSIS — E559 Vitamin D deficiency, unspecified: Secondary | ICD-10-CM

## 2014-04-07 DIAGNOSIS — E785 Hyperlipidemia, unspecified: Secondary | ICD-10-CM

## 2014-04-07 DIAGNOSIS — Z79899 Other long term (current) drug therapy: Secondary | ICD-10-CM

## 2014-04-07 DIAGNOSIS — E538 Deficiency of other specified B group vitamins: Secondary | ICD-10-CM

## 2014-04-07 DIAGNOSIS — R7309 Other abnormal glucose: Secondary | ICD-10-CM

## 2014-04-07 DIAGNOSIS — L4050 Arthropathic psoriasis, unspecified: Secondary | ICD-10-CM

## 2014-04-07 LAB — CBC WITH DIFFERENTIAL/PLATELET
BASOS PCT: 1 % (ref 0–1)
Basophils Absolute: 0.1 10*3/uL (ref 0.0–0.1)
EOS ABS: 0.2 10*3/uL (ref 0.0–0.7)
Eosinophils Relative: 3 % (ref 0–5)
HCT: 39.4 % (ref 36.0–46.0)
Hemoglobin: 13.9 g/dL (ref 12.0–15.0)
Lymphocytes Relative: 29 % (ref 12–46)
Lymphs Abs: 1.7 10*3/uL (ref 0.7–4.0)
MCH: 30.5 pg (ref 26.0–34.0)
MCHC: 35.3 g/dL (ref 30.0–36.0)
MCV: 86.4 fL (ref 78.0–100.0)
Monocytes Absolute: 0.4 10*3/uL (ref 0.1–1.0)
Monocytes Relative: 6 % (ref 3–12)
NEUTROS PCT: 61 % (ref 43–77)
Neutro Abs: 3.6 10*3/uL (ref 1.7–7.7)
PLATELETS: 299 10*3/uL (ref 150–400)
RBC: 4.56 MIL/uL (ref 3.87–5.11)
RDW: 13.3 % (ref 11.5–15.5)
WBC: 5.9 10*3/uL (ref 4.0–10.5)

## 2014-04-07 LAB — HEPATIC FUNCTION PANEL
ALBUMIN: 4.2 g/dL (ref 3.5–5.2)
ALT: 19 U/L (ref 0–35)
AST: 20 U/L (ref 0–37)
Alkaline Phosphatase: 69 U/L (ref 39–117)
BILIRUBIN TOTAL: 0.4 mg/dL (ref 0.2–1.2)
Bilirubin, Direct: 0.1 mg/dL (ref 0.0–0.3)
Indirect Bilirubin: 0.3 mg/dL (ref 0.2–1.2)
Total Protein: 7.1 g/dL (ref 6.0–8.3)

## 2014-04-07 LAB — LIPID PANEL
Cholesterol: 225 mg/dL — ABNORMAL HIGH (ref 0–200)
HDL: 51 mg/dL (ref >39)
LDL Cholesterol: 131 mg/dL — ABNORMAL HIGH (ref 0–99)
Total CHOL/HDL Ratio: 4.4 Ratio
Triglycerides: 216 mg/dL — ABNORMAL HIGH (ref <150)
VLDL: 43 mg/dL — ABNORMAL HIGH (ref 0–40)

## 2014-04-07 LAB — BASIC METABOLIC PANEL WITH GFR
BUN: 12 mg/dL (ref 6–23)
CALCIUM: 9.9 mg/dL (ref 8.4–10.5)
CO2: 27 meq/L (ref 19–32)
CREATININE: 0.83 mg/dL (ref 0.50–1.10)
Chloride: 102 mEq/L (ref 96–112)
GFR, Est African American: 89 mL/min
GFR, Est Non African American: 77 mL/min
GLUCOSE: 93 mg/dL (ref 70–99)
Potassium: 4.5 mEq/L (ref 3.5–5.3)
SODIUM: 139 meq/L (ref 135–145)

## 2014-04-07 LAB — MAGNESIUM: Magnesium: 2 mg/dL (ref 1.5–2.5)

## 2014-04-07 MED ORDER — PHENTERMINE HCL 37.5 MG PO TABS: ORAL_TABLET | ORAL | Status: DC

## 2014-04-07 MED ORDER — TRIAMCINOLONE ACETONIDE 0.1 % EX CREA: 1.0000 "application " | TOPICAL_CREAM | Freq: Two times a day (BID) | CUTANEOUS | Status: DC

## 2014-04-07 NOTE — Patient Instructions (Signed)
Psoriasis Psoriasis is a common, long-lasting (chronic) inflammation of the skin. It affects both men and women equally, of all ages and all races. Psoriasis cannot be passed from person to person (not contagious). Psoriasis varies from mild to very severe. When severe, it can greatly affect your quality of life. Psoriasis is an inflammatory disorder affecting the skin as well as other organs including the joints (causing an arthritis). With psoriasis, the skin sheds its top layer of cells more rapidly than it does in someone without psoriasis. CAUSES  The cause of psoriasis is largely unknown. Genetics, your immune system, and the environment seem to play a role in causing psoriasis. Factors that can make psoriasis worse include:  Damage or trauma to the skin, such as cuts, scrapes, and sunburn. This damage often causes new areas of psoriasis (lesions).  Winter dryness and lack of sunlight.  Medicines such as lithium, beta-blockers, antimalarial drugs, ACE inhibitors, nonsteroidal anti-inflammatory drugs (ibuprofen, aspirin), and terbinafine. Let your caregiver know if you are taking any of these drugs.  Alcohol. Excessive alcohol use should be avoided if you have psoriasis. Drinking large amounts of alcohol can affect:  How well your psoriasis treatment works.  How safe your psoriasis treatment is.  Smoking. If you smoke, ask your caregiver for help to quit.  Stress.  Bacterial or viral infections.  Arthritis. Arthritis associated with psoriasis (psoriatic arthritis) affects less than 10% of patients with psoriasis. The arthritic intensity does not always match the skin psoriasis intensity. It is important to let your caregiver know if your joints hurt or if they are stiff. SYMPTOMS  The most common form of psoriasis begins with little red bumps that gradually become larger. The bumps begin to form scales that flake off easily. The lower layers of scales stick together. When these scales  are scratched or removed, the underlying skin is tender and bleeds easily. These areas then grow in size and may become large. Psoriasis often creates a rash that looks the same on both sides of the body (symmetrical). It often affects the elbows, knees, groin, genitals, arms, legs, scalp, and nails. Affected nails often have pitting, loosen, thicken, crumble, and are difficult to treat.  "Inverse psoriasis"occurs in the armpits, under breasts, in skin folds, and around the groin, buttocks, and genitals.  "Guttate psoriasis" generally occurs in children and young adults following a recent sore throat (strep throat). It begins with many small, red, scaly spots on the skin. It clears spontaneously in weeks or a few months without treatment. DIAGNOSIS  Psoriasis is diagnosed by physical exam. A tissue sample (biopsy) may also be taken. TREATMENT The treatment of psoriasis depends on your age, health, and living conditions.  Steroid (cortisone) creams, lotions, and ointments may be used. These treatments are associated with thinning of the skin, blood vessels that get larger (dilated), loss of skin pigmentation, and easy bruising. It is important to use these steroids as directed by your caregiver. Only treat the affected areas and not the normal, unaffected skin. People on long-term steroid treatment should wear a medical alert bracelet. Injections may be used in areas that are difficult to treat.  Scalp treatments are available as shampoos, solutions, sprays, foams, and oils. Avoid scratching the scalp and picking at the scales.  Anthralin medicine works well on areas that are difficult to treat. However, it stains clothes and skin and may cause temporary irritation.  Synthetic vitamin D (calcipotriene)can be used on small areas. It is available by prescription. The forms   of synthetic vitamin D available in health food stores do not help with psoriasis.  Coal tarsare available in various strengths  for psoriasis that is difficult to treat. They are one of the longest used treatments for difficult to treat psoriasis. However, they are messy to use.  Light therapy (UV therapy) can be carefully and professionally monitored in a dermatologist's office. Careful sunbathing is helpful for many people as directed by your caregiver. The exposure should be just long enough to cause a mild redness (erythema) of your skin. Avoid sunburn as this may make the condition worse. Sunscreen (SPF of 30 or higher) should be used to protect against sunburn. Cataracts, wrinkles, and skin aging are some of the harmful side effects of light therapy.  If creams (topical medicines) fail, there are several other options for systemic or oral medicines your caregiver can suggest. Psoriasis can sometimes be very difficult to treat. It can come and go. It is necessary to follow up with your caregiver regularly if your psoriasis is difficult to treat. Usually, with persistence you can get a good amount of relief. Maintaining consistent care is important. Do not change caregivers just because you do not see immediate results. It may take several trials to find the right combination of treatment for you. PREVENTING FLARE-UPS  Wear gloves while you wash dishes, while cleaning, and when you are outside in the cold.  If you have radiators, place a bowl of water or damp towel on the radiator. This will help put water back in the air. You can also use a humidifier to keep the air moist. Try to keep the humidity at about 60% in your home.  Apply moisturizer while your skin is still damp from bathing or showering. This traps water in the skin.  Avoid long, hot baths or showers. Keep soap use to a minimum. Soaps dry out the skin and wash away the protective oils. Use a fragrance free, dye free soap.  Drink enough water and fluids to keep your urine clear or pale yellow. Not drinking enough water depletes your skin's water  supply.  Turn off the heat at night and keep it low during the day. Cool air is less drying. SEEK MEDICAL CARE IF:  You have increasing pain in the affected areas.  You have uncontrolled bleeding in the affected areas.  You have increasing redness or warmth in the affected areas.  You start to have pain or stiffness in your joints.  You start feeling depressed about your condition.  You have a fever. Document Released: 09/15/2000 Document Revised: 12/11/2011 Document Reviewed: 03/13/2011 [employer] Patient Information 2015 [employer]. This information is not intended to replace advice given to you by your health care provider. Make sure you discuss any questions you have with your health care provider.  

## 2014-04-07 NOTE — Progress Notes (Signed)
Assessment and Plan:  Hypertension: Continue medication, monitor blood pressure at home. Continue DASH diet. Cholesterol: Continue diet and exercise. Check cholesterol.  Pre-diabetes-Continue diet and exercise. Check A1C Vitamin D Def- check level and continue medications.  ?psoriatic arthritis/psoriasis- will refer to rhematology  Continue diet and meds as discussed. Further disposition pending results of labs.  HPI 58 y.o. female  presents for 3 month follow up with hypertension, hyperlipidemia, prediabetes and vitamin D. Her blood pressure has been controlled at home, today their BP is BP: 120/80 mmHg She does workout. She denies chest pain, shortness of breath, dizziness.  She has been working on diet and exercise for prediabetes, and denies polydipsia and polyuria. Last A1C in the office was:  Lab Results  Component Value Date   HGBA1C 5.8* 12/12/2013   Patient is on Vitamin D supplement.   Lab Results  Component Value Date   VD25OH 30 09/29/2013     Vitamin B12 302 She is a grandmother, 56 month old baby boy, Angela Adam.  She has seen Dr. Delice Lesch with [facility] and she had a biopsy that showed eczema/psoriatic arthritis and she has questionable psoriatic arthritis. She has been on enbrel in the past that did not help. She states she has bilateral leg stiffness in the morning for 1-2 hours, worse with sitting for a long time. She also describes hand and elbow pain.    Current Medications:  Current Outpatient Prescriptions on File Prior to Visit  Medication Sig Dispense Refill  . acetaminophen (TYLENOL) 500 MG tablet Take by mouth every 6 (six) hours as needed.      Marland Kitchen azithromycin (ZITHROMAX) 250 MG tablet Take 2 tablets (500 mg) on  Day 1,  followed by 1 tablet (250 mg) once daily on Days 2 through 5.  6 each  1  . cetirizine (ZYRTEC) 10 MG tablet Take 10 mg by mouth daily.      . DiphenhydrAMINE HCl (BENADRYL ALLERGY PO) Take by mouth.      . Ibuprofen (ADVIL PO) Take by mouth.      .  phentermine (ADIPEX-P) 37.5 MG tablet Take 1/2 to 1 tablet every morning for dieting and weight control  30 tablet  2  . predniSONE (DELTASONE) 20 MG tablet 1 tab 3 x day for 2 days, then 1 tab 2 x day for 2 days, then 1 tab 1 x day for 3 days  13 tablet  0   No current facility-administered medications on file prior to visit.   Medical History:  Past Medical History  Diagnosis Date  . Seasonal allergies   . Mass of chest wall 12/2012    tender to the touch  . Allergy   . Osteoarthritis    Allergies: No Known Allergies   Review of Systems: [X]  = complains of  [ ]  = denies  General: Fatigue [ ]  Fever [ ]  Chills [ ]  Weakness [ ]   Insomnia [ ]  Eyes: Redness [ ]  Blurred vision [ ]  Diplopia [ ]   ENT: Congestion [ ]  Sinus Pain [ ]  Post Nasal Drip [ ]  Sore Throat [ ]  Earache [ ]   Cardiac: Chest pain/pressure [ ]  SOB [ ]  Orthopnea [ ]   Palpitations [ ]   Paroxysmal nocturnal dyspnea[ ]  Claudication [ ]  Edema [ ]   Pulmonary: Cough [ ]  Wheezing[ ]   SOB [ ]   Snoring [ ]   GI: Nausea [ ]  Vomiting[ ]  Dysphagia[ ]  Heartburn[ ]  Abdominal pain [ ]  Constipation [ ] ; Diarrhea [ ] ; BRBPR [ ]   Melena[ ]  GU: Hematuria[ ]  Dysuria [ ]  Nocturia[ ]  Urgency [ ]   Hesitancy [ ]  Discharge [ ]  Neuro: Headaches[ ]  Vertigo[ ]  Paresthesias[ ]  Spasm [ ]  Speech changes [ ]  Incoordination [ ]   Ortho: Arthritis Valu.Nieves ] Joint pain Valu.Nieves ] Muscle pain [ ]  Joint swelling [ ]  Back Pain [ ]  Skin:  Rash [ X]  Pruritis Valu.Nieves ] Change in skin lesion [ ]   Psych: Depression[ ]  Anxiety[ ]  Confusion [ ]  Memory loss [ ]   Heme/Lypmh: Bleeding [ ]  Bruising [ ]  Enlarged lymph nodes [ ]   Endocrine: Visual blurring [ ]  Paresthesia [ ]  Polyuria [ ]  Polydypsea [ ]    Heat/cold intolerance [ ]  Hypoglycemia [ ]   Family history- Review and unchanged Social history- Review and unchanged Physical Exam: BP 120/80  Pulse 80  Temp(Src) 97.7 F (36.5 C)  Resp 16  Ht 5\' 5"  (1.651 m)  Wt 158 lb (71.668 kg)  BMI 26.29 kg/m2 Wt Readings from Last 3  Encounters:  04/07/14 158 lb (71.668 kg)  12/12/13 159 lb (72.122 kg)  10/27/13 157 lb 3.2 oz (71.305 kg)   General Appearance: Well nourished, in no apparent distress. Eyes: PERRLA, EOMs, conjunctiva no swelling or erythema Sinuses: No Frontal/maxillary tenderness ENT/Mouth: Ext aud canals clear, TMs without erythema, bulging. No erythema, swelling, or exudate on post pharynx.  Tonsils not swollen or erythematous. Hearing normal.  Neck: Supple, thyroid normal.  Respiratory: Respiratory effort normal, BS equal bilaterally without rales, rhonchi, wheezing or stridor. Cardio: RRR with no MRGs. Brisk peripheral pulses without edema.  Abdomen: Soft, + BS.  Non tender, no guarding, rebound, hernias, masses. Lymphatics: Non tender without lymphadenopathy.  Musculoskeletal: Full ROM, 5/5 strength, normal gait.  Skin: Warm, dry without rashes, lesions, ecchymosis Large scaly erythematous plaques on bilateral legs, feet, arms, and some on chest Neuro: Cranial nerves intact. Normal muscle tone, no cerebellar symptoms. Sensation intact.  Psych: Awake and oriented X 3, normal affect, Insight and Judgment appropriate.    [name] 5:14 PM

## 2014-04-08 LAB — HEMOGLOBIN A1C
Hgb A1c MFr Bld: 5.6 % (ref <5.7)
MEAN PLASMA GLUCOSE: 114 mg/dL (ref <117)

## 2014-04-08 LAB — TSH: TSH: 2.005 u[IU]/mL (ref 0.350–4.500)

## 2014-04-08 LAB — VITAMIN D 25 HYDROXY (VIT D DEFICIENCY, FRACTURES): Vit D, 25-Hydroxy: 37 ng/mL (ref 30–89)

## 2014-04-08 LAB — VITAMIN B12: VITAMIN B 12: 451 pg/mL (ref 211–911)

## 2014-04-09 ENCOUNTER — Encounter (INDEPENDENT_AMBULATORY_CARE_PROVIDER_SITE_OTHER): Payer: Self-pay | Admitting: General Surgery

## 2014-04-09 ENCOUNTER — Ambulatory Visit (INDEPENDENT_AMBULATORY_CARE_PROVIDER_SITE_OTHER): Payer: BC Managed Care – PPO | Admitting: General Surgery

## 2014-04-09 VITALS — BP 124/82 | HR 72 | Temp 97.7°F | Resp 14 | Ht 66.0 in | Wt 157.2 lb

## 2014-04-09 DIAGNOSIS — M25519 Pain in unspecified shoulder: Secondary | ICD-10-CM

## 2014-04-09 DIAGNOSIS — E041 Nontoxic single thyroid nodule: Secondary | ICD-10-CM

## 2014-04-09 NOTE — Patient Instructions (Signed)
We will get a thyroid ultrasound.    Follow up to be determined.

## 2014-04-09 NOTE — Progress Notes (Signed)
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  At her initial evaluation, she complained of neck swelling. Her neck did appear asymmetric and I ordered Dopplers and a neck ultrasound. She did not have any evidence of a DVT in her neck or upper extremities. Her neck ultrasound was not performed initially. I did take her to surgery and remove the lipoma from her sternum. This was very sore and she felt significantly improved after surgery. However, she continues to complain of some swelling over the upper part of the sternum farther away from the surgical site.  Because of her complaints of dysphasia and neck swelling, I did pursue her neck ultrasound and her thyroid demonstrated multiple nodules. One of these met criteria for biopsy which was performed. This demonstrated benign follicular nodule.  She has a family history of thyroid cancer and multinodular goiter. She is quite concerned about this. Her thyroid function tests are normal.    Pt returns to discuss the swelling at her upper chest.  She states that it was feeling good until around 3 months ago.  It started to feel swollen and get sore when she wore her seatbelt.     PERTINENT REVIEW OF SYSTEMS: Negative x 11 other than HPI  Filed Vitals:   04/09/14 1654  BP: 124/82  Pulse: 72  Temp: 97.7 F (36.5 C)  Resp: 14   Filed Weights   04/09/14 1654  Weight: 157 lb 3.2 oz (71.305 kg)    EXAM: Head: Normocephalic and atraumatic.  Eyes:  Conjunctivae are normal. Pupils are equal, round, and reactive to light. No scleral icterus.  Neck:  Normal range of motion. Neck supple. No tracheal deviation present. No thyromegaly present. Right neck resolved.   Chest wall:  Incision at top of cleavage well healed.  Soft tissue fullness over manubrium of sternum. No mass present.  Just feels like normal subcutaneous fat.  Prominent manubrium.   Resp: No respiratory distress, normal  effort. Abd:  Abdomen is soft, non distended and non tender. No masses are palpable.  There is no rebound and no guarding.  Neurological: Alert and oriented to person, place, and time. Coordination normal.  Skin: Skin is warm and dry. No rash noted. No diaphoretic. No erythema. No pallor.  Psychiatric: Normal mood and affect. Normal behavior. Judgment and thought content normal.     ASSESSMENT AND PLAN:   Left thyroid nodule Follow up ultrasound.    Sternoclavicular joint pain Still no evidence of mass in this area.     Will see what follow up is for thyroid after ultrasound.  Milus Height, MD Surgical Oncology, [facility]  MCKEOWN,WILLIAM DAVID, MD No ref. provider found

## 2014-04-09 NOTE — Assessment & Plan Note (Signed)
Still no evidence of mass in this area.

## 2014-04-09 NOTE — Assessment & Plan Note (Signed)
Follow up ultrasound

## 2014-04-10 ENCOUNTER — Other Ambulatory Visit (INDEPENDENT_AMBULATORY_CARE_PROVIDER_SITE_OTHER): Payer: Self-pay | Admitting: General Surgery

## 2014-04-10 DIAGNOSIS — E041 Nontoxic single thyroid nodule: Secondary | ICD-10-CM

## 2014-04-15 ENCOUNTER — Other Ambulatory Visit: Payer: BC Managed Care – PPO

## 2014-07-14 ENCOUNTER — Ambulatory Visit: Payer: Self-pay | Admitting: Internal Medicine

## 2014-07-28 ENCOUNTER — Encounter: Payer: Self-pay | Admitting: Physician Assistant

## 2014-07-28 ENCOUNTER — Ambulatory Visit (INDEPENDENT_AMBULATORY_CARE_PROVIDER_SITE_OTHER): Payer: BC Managed Care – PPO | Admitting: Physician Assistant

## 2014-07-28 VITALS — BP 102/62 | HR 70 | Temp 97.9°F | Resp 16 | Ht 65.0 in | Wt 157.0 lb

## 2014-07-28 DIAGNOSIS — R7309 Other abnormal glucose: Secondary | ICD-10-CM

## 2014-07-28 DIAGNOSIS — R1032 Left lower quadrant pain: Secondary | ICD-10-CM

## 2014-07-28 DIAGNOSIS — E785 Hyperlipidemia, unspecified: Secondary | ICD-10-CM | POA: Insufficient documentation

## 2014-07-28 DIAGNOSIS — R7303 Prediabetes: Secondary | ICD-10-CM

## 2014-07-28 DIAGNOSIS — E559 Vitamin D deficiency, unspecified: Secondary | ICD-10-CM

## 2014-07-28 DIAGNOSIS — Z79899 Other long term (current) drug therapy: Secondary | ICD-10-CM

## 2014-07-28 HISTORY — DX: Hyperlipidemia, unspecified: E78.5

## 2014-07-28 LAB — CBC WITH DIFFERENTIAL/PLATELET
BASOS PCT: 1 % (ref 0–1)
Basophils Absolute: 0.1 10*3/uL (ref 0.0–0.1)
EOS ABS: 0.4 10*3/uL (ref 0.0–0.7)
EOS PCT: 6 % — AB (ref 0–5)
HCT: 36.9 % (ref 36.0–46.0)
HEMOGLOBIN: 12.9 g/dL (ref 12.0–15.0)
LYMPHS ABS: 1.9 10*3/uL (ref 0.7–4.0)
Lymphocytes Relative: 31 % (ref 12–46)
MCH: 29.8 pg (ref 26.0–34.0)
MCHC: 35 g/dL (ref 30.0–36.0)
MCV: 85.2 fL (ref 78.0–100.0)
MONOS PCT: 7 % (ref 3–12)
Monocytes Absolute: 0.4 10*3/uL (ref 0.1–1.0)
Neutro Abs: 3.4 10*3/uL (ref 1.7–7.7)
Neutrophils Relative %: 55 % (ref 43–77)
Platelets: 331 10*3/uL (ref 150–400)
RBC: 4.33 MIL/uL (ref 3.87–5.11)
RDW: 13.2 % (ref 11.5–15.5)
WBC: 6.1 10*3/uL (ref 4.0–10.5)

## 2014-07-28 LAB — HEMOGLOBIN A1C
HEMOGLOBIN A1C: 5.4 % (ref <5.7)
Mean Plasma Glucose: 108 mg/dL (ref <117)

## 2014-07-28 NOTE — Patient Instructions (Signed)
Benefiber is good for constipation/diarrhea/irritable bowel syndrome, it helps with weight loss and can help lower your bad cholesterol. Please do 1-2 TBSP in the morning in water, coffee, or tea. It can take up to a month before you can see a difference with your bowel movements. It is cheapest from [employer], [employer], [employer].     Bad carbs also include fruit juice, alcohol, and sweet tea. These are empty calories that do not signal to your brain that you are full.   Please remember the good carbs are still carbs which convert into sugar. So please measure them out no more than 1/2-1 cup of rice, oatmeal, pasta, and beans.  Veggies are however free foods! Pile them on.   I like lean protein at every meal such as chicken, turkey, pork chops, cottage cheese, etc. Just do not fry these meats and please center your meal around vegetable, the meats should be a side dish.   No all fruit is created equal. Please see the list below, the fruit at the bottom is higher in sugars than the fruit at the top    

## 2014-07-28 NOTE — Progress Notes (Signed)
Assessment and Plan:  Hypertension: Continue medication, monitor blood pressure at home. Continue DASH diet.  Reminder to go to the ER if any CP, SOB, nausea, dizziness, severe HA, changes vision/speech, left arm numbness and tingling, and jaw pain. Cholesterol: Continue diet and exercise. Check cholesterol.  Pre-diabetes-Continue diet and exercise. Check A1C Vitamin D Def- check level and continue medications.   Continue diet and meds as discussed. Further disposition pending results of labs.  HPI 58 y.o. female  presents for 3 month follow up with hypertension, hyperlipidemia, prediabetes and vitamin D. Her blood pressure has been controlled at home, today their BP is BP: 102/62 mmHg She does not workout. She denies chest pain, shortness of breath, dizziness.  She is not on cholesterol medication and denies myalgias. Her cholesterol is not at goal. The cholesterol last visit was:   Lab Results  Component Value Date   CHOL 225* 04/07/2014   HDL 51 04/07/2014   LDLCALC 131* 04/07/2014   TRIG 216* 04/07/2014   CHOLHDL 4.4 04/07/2014   She has been working on diet and exercise for prediabetes, and denies paresthesia of the feet, polydipsia, polyuria and visual disturbances. Last A1C in the office was:  Lab Results  Component Value Date   HGBA1C 5.6 04/07/2014   Patient is on Vitamin D supplement.   Lab Results  Component Value Date   VD25OH 37 04/07/2014     She has chronic psoriasis and has problem with sleeping due to itching and pain. She is seeing rhem and ortho for her multiple complaints.  She also had a thyroid US and biopsy that was negative.  Left sided lower quadrant pain for last 3-6 months,   Current Medications:  Current Outpatient Prescriptions on File Prior to Visit  Medication Sig Dispense Refill  . acetaminophen (TYLENOL) 500 MG tablet Take by mouth every 6 (six) hours as needed.      . cetirizine (ZYRTEC) 10 MG tablet Take 10 mg by mouth daily.      . DiphenhydrAMINE HCl  (BENADRYL ALLERGY PO) Take by mouth.      . fluocinonide (LIDEX) 0.05 % external solution       . fluorouracil (EFUDEX) 5 % cream        No current facility-administered medications on file prior to visit.   Medical History:  Past Medical History  Diagnosis Date  . Seasonal allergies   . Mass of chest wall 12/2012    tender to the touch  . Allergy   . Osteoarthritis    Allergies: No Known Allergies   Review of Systems: [X]  = complains of  [ ]  = denies  General: Fatigue [ ]  Fever [ ]  Chills [ ]  Weakness [ ]   Insomnia [ ]  Eyes: Redness [ ]  Blurred vision [ ]  Diplopia [ ]   ENT: Congestion [ ]  Sinus Pain [ ]  Post Nasal Drip [ ]  Sore Throat [ ]  Earache [ ]   Cardiac: Chest pain/pressure [ ]  SOB [ ]  Orthopnea [ ]   Palpitations [ ]   Paroxysmal nocturnal dyspnea[ ]  Claudication [ ]  Edema [ ]   Pulmonary: Cough [ ]  Wheezing[ ]   SOB [ ]   Snoring [ ]   GI: Nausea [ ]  Vomiting[ ]  Dysphagia[ ]  Heartburn[ ]  Abdominal pain [ ]  Constipation [ ] ; Diarrhea [ ] ; BRBPR [ ]  Melena[ ]  GU: Hematuria[ ]  Dysuria [ ]  Nocturia[ ]  Urgency [ ]   Hesitancy [ ]  Discharge [ ]  Neuro: Headaches[ ]  Vertigo[ ]  Paresthesias[ ]  Spasm [ ]  Speech changes [ ]   Incoordination [ ]   Ortho: Arthritis [ ]  Joint pain [ ]  Muscle pain [ ]  Joint swelling [ ]  Back Pain [ ]  Skin:  Rash [ ]   Pruritis [ ]  Change in skin lesion [ ]   Psych: Depression[ ]  Anxiety[ ]  Confusion [ ]  Memory loss [ ]   Heme/Lypmh: Bleeding [ ]  Bruising [ ]  Enlarged lymph nodes [ ]   Endocrine: Visual blurring [ ]  Paresthesia [ ]  Polyuria [ ]  Polydypsea [ ]    Heat/cold intolerance [ ]  Hypoglycemia [ ]   Family history- Review and unchanged Social history- Review and unchanged Physical Exam: BP 102/62  Pulse 70  Temp(Src) 97.9 F (36.6 C)  Resp 16  Ht 5\' 5"  (1.651 m)  Wt 157 lb (71.215 kg)  BMI 26.13 kg/m2 Wt Readings from Last 3 Encounters:  07/28/14 157 lb (71.215 kg)  04/09/14 157 lb 3.2 oz (71.305 kg)  04/07/14 158 lb (71.668 kg)   General Appearance:  Well nourished, in no apparent distress. Eyes: PERRLA, EOMs, conjunctiva no swelling or erythema Sinuses: No Frontal/maxillary tenderness ENT/Mouth: Ext aud canals clear, TMs without erythema, bulging. No erythema, swelling, or exudate on post pharynx.  Tonsils not swollen or erythematous. Hearing normal.  Neck: Supple, thyroid normal.  Respiratory: Respiratory effort normal, BS equal bilaterally without rales, rhonchi, wheezing or stridor.  Cardio: RRR with no MRGs. Brisk peripheral pulses without edema.  Abdomen: Soft, + BS.  Non tender, no guarding, rebound, hernias, masses. Lymphatics: Non tender without lymphadenopathy.  Musculoskeletal: Full ROM, 5/5 strength, normal gait.  Skin: Warm, dry without rashes, lesions, ecchymosis.  Neuro: Cranial nerves intact. Normal muscle tone, no cerebellar symptoms. Sensation intact.  Psych: Awake and oriented X 3, normal affect, Insight and Judgment appropriate.    [name], PA-C 8:57 AM [facility]

## 2014-07-29 LAB — HEPATIC FUNCTION PANEL
ALT: 17 U/L (ref 0–35)
AST: 22 U/L (ref 0–37)
Albumin: 4.1 g/dL (ref 3.5–5.2)
Alkaline Phosphatase: 63 U/L (ref 39–117)
BILIRUBIN INDIRECT: 0.3 mg/dL (ref 0.2–1.2)
BILIRUBIN TOTAL: 0.4 mg/dL (ref 0.2–1.2)
Bilirubin, Direct: 0.1 mg/dL (ref 0.0–0.3)
Total Protein: 6.5 g/dL (ref 6.0–8.3)

## 2014-07-29 LAB — LIPID PANEL
Cholesterol: 181 mg/dL (ref 0–200)
HDL: 45 mg/dL (ref >39)
LDL Cholesterol: 115 mg/dL — ABNORMAL HIGH (ref 0–99)
TRIGLYCERIDES: 104 mg/dL (ref <150)
Total CHOL/HDL Ratio: 4 Ratio
VLDL: 21 mg/dL (ref 0–40)

## 2014-07-29 LAB — BASIC METABOLIC PANEL WITH GFR
BUN: 13 mg/dL (ref 6–23)
CO2: 25 meq/L (ref 19–32)
CREATININE: 0.8 mg/dL (ref 0.50–1.10)
Calcium: 9.1 mg/dL (ref 8.4–10.5)
Chloride: 103 mEq/L (ref 96–112)
GFR, Est African American: >89 mL/min
GFR, Est Non African American: 80 mL/min
GLUCOSE: 93 mg/dL (ref 70–99)
Potassium: 4.4 mEq/L (ref 3.5–5.3)
SODIUM: 140 meq/L (ref 135–145)

## 2014-07-29 LAB — URINALYSIS, ROUTINE W REFLEX MICROSCOPIC
Glucose, UA: NEGATIVE mg/dL
HGB URINE DIPSTICK: NEGATIVE
LEUKOCYTES UA: NEGATIVE
NITRITE: NEGATIVE
PH: 5 (ref 5.0–8.0)
PROTEIN: NEGATIVE mg/dL
Specific Gravity, Urine: 1.026 (ref 1.005–1.030)
Urobilinogen, UA: 0.2 mg/dL (ref 0.0–1.0)

## 2014-07-29 LAB — MAGNESIUM: MAGNESIUM: 2 mg/dL (ref 1.5–2.5)

## 2014-07-29 LAB — TSH: TSH: 2.308 u[IU]/mL (ref 0.350–4.500)

## 2014-07-29 LAB — VITAMIN D 25 HYDROXY (VIT D DEFICIENCY, FRACTURES): VIT D 25 HYDROXY: 42 ng/mL (ref 30–89)

## 2014-07-29 LAB — INSULIN, FASTING: INSULIN FASTING, SERUM: 3.5 u[IU]/mL (ref 2.0–19.6)

## 2014-07-30 LAB — URINE CULTURE
Colony Count: NO GROWTH
ORGANISM ID, BACTERIA: NO GROWTH

## 2014-10-31 ENCOUNTER — Other Ambulatory Visit: Payer: Self-pay | Admitting: Internal Medicine

## 2014-10-31 MED ORDER — AZITHROMYCIN 250 MG PO TABS: ORAL_TABLET | ORAL | Status: DC

## 2014-10-31 MED ORDER — PREDNISONE 20 MG PO TABS: ORAL_TABLET | ORAL | Status: DC

## 2014-10-31 MED ORDER — BENZONATATE 200 MG PO CAPS: 200.0000 mg | ORAL_CAPSULE | Freq: Three times a day (TID) | ORAL | Status: DC | PRN

## 2015-01-29 ENCOUNTER — Encounter: Payer: Self-pay | Admitting: Physician Assistant

## 2015-01-29 ENCOUNTER — Ambulatory Visit (INDEPENDENT_AMBULATORY_CARE_PROVIDER_SITE_OTHER): Payer: 59 | Admitting: Physician Assistant

## 2015-01-29 VITALS — BP 110/68 | HR 76 | Temp 98.4°F | Resp 16 | Ht 65.0 in | Wt 159.0 lb

## 2015-01-29 DIAGNOSIS — R7309 Other abnormal glucose: Secondary | ICD-10-CM

## 2015-01-29 DIAGNOSIS — R7303 Prediabetes: Secondary | ICD-10-CM

## 2015-01-29 DIAGNOSIS — E785 Hyperlipidemia, unspecified: Secondary | ICD-10-CM

## 2015-01-29 DIAGNOSIS — R32 Unspecified urinary incontinence: Secondary | ICD-10-CM

## 2015-01-29 DIAGNOSIS — E559 Vitamin D deficiency, unspecified: Secondary | ICD-10-CM

## 2015-01-29 DIAGNOSIS — L4050 Arthropathic psoriasis, unspecified: Secondary | ICD-10-CM

## 2015-01-29 DIAGNOSIS — Z79899 Other long term (current) drug therapy: Secondary | ICD-10-CM

## 2015-01-29 LAB — HEMOGLOBIN A1C
HEMOGLOBIN A1C: 6.1 % — AB (ref <5.7)
Mean Plasma Glucose: 128 mg/dL — ABNORMAL HIGH (ref <117)

## 2015-01-29 MED ORDER — MIRABEGRON ER 50 MG PO TB24: 50.0000 mg | ORAL_TABLET | Freq: Every day | ORAL | Status: DC

## 2015-01-29 NOTE — Progress Notes (Signed)
Assessment and Plan:  1. Hypertension -Continue medication, monitor blood pressure at home. Continue DASH diet.  Reminder to go to the ER if any CP, SOB, nausea, dizziness, severe HA, changes vision/speech, left arm numbness and tingling and jaw pain.  2. Cholesterol -Continue diet and exercise. Check cholesterol.   3. Prediabetes  -Continue diet and exercise. Check A1C  4. Vitamin D Def - check level and continue medications.   5. Psoriatic Arthritis-  Follow up with Dr. Amil Amen Will get notes Check labs, will not check CBC, CMET Finally on DMARD and having some improvement  6. Stress incontinence Check urine, will give samples of myrebetriq, bladder matters given  Continue diet and meds as discussed. Further disposition pending results of labs. Over 30 minutes of exam, counseling, chart review, and critical decision making was performed  HPI 59 y.o. female  presents for 3 month follow up on hypertension, cholesterol, prediabetes, and vitamin D deficiency.   Her blood pressure has been controlled at home, today their BP is BP: 110/68 mmHg  She does not workout. She denies chest pain, shortness of breath, dizziness.  She is not on cholesterol medication and denies myalgias. Her cholesterol is at goal. The cholesterol last visit was:   Lab Results  Component Value Date   CHOL 181 07/28/2014   HDL 45 07/28/2014   LDLCALC 115* 07/28/2014   TRIG 104 07/28/2014   CHOLHDL 4.0 07/28/2014    She has been working on diet and exercise for prediabetes, and denies paresthesia of the feet, polydipsia, polyuria and visual disturbances. Last A1C in the office was:  Lab Results  Component Value Date   HGBA1C 5.4 07/28/2014   Patient is on Vitamin D supplement.   Lab Results  Component Value Date   VD25OH 42 07/28/2014     She has psoriatic arthritis and is seeing Dr. Amil Amen, she was started on methotrexate for 2 1/2 months which is helping some but her LFTs were elevated some,  recently checked this week, will request labs/notes.   Patient is s/p hysterectomy, has stress incontinence with coughing/laughing. She has had some lower back pain and some worsening incontinence. Denies anything falling out.   Current Medications:  Current Outpatient Prescriptions on File Prior to Visit  Medication Sig Dispense Refill  . acetaminophen (TYLENOL) 500 MG tablet Take by mouth every 6 (six) hours as needed.    . benzonatate (TESSALON) 200 MG capsule Take 1 capsule (200 mg total) by mouth 3 (three) times daily as needed for cough. 30 capsule 1  . cetirizine (ZYRTEC) 10 MG tablet Take 10 mg by mouth daily.    . DiphenhydrAMINE HCl (BENADRYL ALLERGY PO) Take by mouth.    . fluocinonide (LIDEX) 0.05 % external solution     . fluorouracil (EFUDEX) 5 % cream      No current facility-administered medications on file prior to visit.   Medical History:  Past Medical History  Diagnosis Date  . Seasonal allergies   . Mass of chest wall 12/2012    tender to the touch  . Allergy   . Osteoarthritis    Allergies: No Known Allergies   Review of Systems:  Review of Systems  Constitutional: Positive for malaise/fatigue. Negative for fever, chills, weight loss and diaphoresis.  HENT: Negative.   Eyes: Negative.   Respiratory: Negative.   Cardiovascular: Negative.   Gastrointestinal: Negative.   Genitourinary: Positive for frequency. Negative for dysuria, urgency, hematuria and flank pain.       +  incontinence  Musculoskeletal: Positive for myalgias, back pain and joint pain. Negative for falls and neck pain.  Skin: Positive for itching and rash.  Neurological: Negative.  Negative for weakness.  Psychiatric/Behavioral: Negative.     Family history- Review and unchanged Social history- Review and unchanged Physical Exam: BP 110/68 mmHg  Pulse 76  Temp(Src) 98.4 F (36.9 C)  Resp 16  Ht 5\' 5"  (1.651 m)  Wt 159 lb (72.122 kg)  BMI 26.46 kg/m2 Wt Readings from Last 3  Encounters:  01/29/15 159 lb (72.122 kg)  07/28/14 157 lb (71.215 kg)  04/09/14 157 lb 3.2 oz (71.305 kg)   General Appearance: Well nourished, in no apparent distress. Eyes: PERRLA, EOMs, conjunctiva no swelling or erythema Sinuses: No Frontal/maxillary tenderness ENT/Mouth: Ext aud canals clear, TMs without erythema, bulging. No erythema, swelling, or exudate on post pharynx.  Tonsils not swollen or erythematous. Hearing normal.  Neck: Supple, thyroid normal.  Respiratory: Respiratory effort normal, BS equal bilaterally without rales, rhonchi, wheezing or stridor. Cardio: RRR with no MRGs. Brisk peripheral pulses without edema.  Abdomen: Soft, + BS.  Non tender, no guarding, rebound, hernias, masses. Lymphatics: Non tender without lymphadenopathy.  Musculoskeletal: Full ROM, 5/5 strength, normal gait.  Skin: Warm, dry without rashes, lesions, ecchymosis Large scaly erythematous plaques on bilateral legs, feet, arms, and some on chest Neuro: Cranial nerves intact. Normal muscle tone, no cerebellar symptoms. Sensation intact.  Psych: Awake and oriented X 3, normal affect, Insight and Judgment appropriate.     [name], PA-C 9:02 AM [facility]

## 2015-01-29 NOTE — Patient Instructions (Signed)
Before you even begin to attack a weight-loss plan, it pays to remember this: You are not fat. You have fat. Losing weight isn't about blame or shame; it's simply another achievement to accomplish. Dieting is like any other skill-you have to buckle down and work at it. As long as you act in a smart, reasonable way, you'll ultimately get where you want to be. Here are some weight loss pearls for you.  1. It's Not a Diet. It's a Lifestyle Thinking of a diet as something you're on and suffering through only for the short term doesn't work. To shed weight and keep it off, you need to make permanent changes to the way you eat. It's OK to indulge occasionally, of course, but if you cut calories temporarily and then revert to your old way of eating, you'll gain back the weight quicker than you can say yo-yo. Use it to lose it. Research shows that one of the best predictors of long-term weight loss is how many pounds you drop in the first month. For that reason, nutritionists often suggest being stricter for the first two weeks of your new eating strategy to build momentum. Cut out added sugar and alcohol and avoid unrefined carbs. After that, figure out how you can reincorporate them in a way that's healthy and maintainable.  2. There's a Right Way to Exercise Working out burns calories and fat and boosts your metabolism by building muscle. But those trying to lose weight are notorious for overestimating the number of calories they burn and underestimating the amount they take in. Unfortunately, your system is biologically programmed to hold on to extra pounds and that means when you start exercising, your body senses the deficit and ramps up its hunger signals. If you're not diligent, you'll eat everything you burn and then some. Use it to lose it. Cardio gets all the exercise glory, but strength and interval training are the real heroes. They help you build lean muscle, which in turn increases your metabolism and  calorie-burning ability 3. Don't Overreact to Mild Hunger Some people have a hard time losing weight because of hunger anxiety. To them, being hungry is bad-something to be avoided at all costs-so they carry snacks with them and eat when they don't need to. Others eat because they're stressed out or bored. While you never want to get to the point of being ravenous (that's when bingeing is likely to happen), a hunger pang, a craving, or the fact that it's 3:00 p.m. should not send you racing for the vending machine or obsessing about the energy bar in your purse. Ideally, you should put off eating until your stomach is growling and it's difficult to concentrate.  Use it to lose it. When you feel the urge to eat, use the HALT method. Ask yourself, Am I really hungry? Or am I angry or anxious, lonely or bored, or tired? If you're still not certain, try the apple test. If you're truly hungry, an apple should seem delicious; if it doesn't, something else is going on. Or you can try drinking water and making yourself busy, if you are still hungry try a healthy snack.  4. Not All Calories Are Created Equal The mechanics of weight loss are pretty simple: Take in fewer calories than you use for energy. But the kind of food you eat makes all the difference. Processed food that's high in saturated fat and refined starch or sugar can cause inflammation that disrupts the hormone signals that tell  your brain you're full. The result: You eat a lot more.  Use it to lose it. Clean up your diet. Swap in whole, unprocessed foods, including vegetables, lean protein, and healthy fats that will fill you up and give you the biggest nutritional bang for your calorie buck. In a few weeks, as your brain starts receiving regular hunger and fullness signals once again, you'll notice that you feel less hungry overall and naturally start cutting back on the amount you eat.  5. Protein, Produce, and Plant-Based Fats Are Your Weight-Loss  [facility] Here's why eating the three Ps regularly will help you drop pounds. Protein fills you up. You need it to build lean muscle, which keeps your metabolism humming so that you can torch more fat. People in a weight-loss program who ate double the recommended daily allowance for protein (about 110 grams for a 150-pound woman) lost 70 percent of their weight from fat, while people who ate the RDA lost only about 40 percent, one study found. Produce is packed with filling fiber. "It's very difficult to consume too many calories if you're eating a lot of vegetables. Example: Three cups of broccoli is a lot of food, yet only 93 calories. (Fruit is another story. It can be easy to overeat and can contain a lot of calories from sugar, so be sure to monitor your intake.) Plant-based fats like olive oil and those in avocados and nuts are healthy and extra satiating.  Use it to lose it. Aim to incorporate each of the three Ps into every meal and snack. People who eat protein throughout the day are able to keep weight off, according to a study in the American Journal of Clinical Nutrition. In addition to meat, poultry and seafood, good sources are beans, lentils, eggs, tofu, and yogurt. As for fat, keep portion sizes in check by measuring out salad dressing, oil, and nut butters (shoot for one to two tablespoons). Finally, eat veggies or a little fruit at every meal. People who did that consumed 308 fewer calories but didn't feel any hungrier than when they didn't eat more produce.  7. How You Eat Is As Important As What You Eat In order for your brain to register that you're full, you need to focus on what you're eating. Sit down whenever you eat, preferably at a table. Turn off the TV or computer, put down your phone, and look at your food. Smell it. Chew slowly, and don't put another bite on your fork until you swallow. When women ate lunch this attentively, they consumed 30 percent less when snacking later than  those who listened to an audiobook at lunchtime, according to a study in the British Journal of Nutrition. 8. Weighing Yourself Really Works The scale provides the best evidence about whether your efforts are paying off. Seeing the numbers tick up or down or stagnate is motivation to keep going-or to rethink your approach. A 2015 study at [employer] found that daily weigh-ins helped people lose more weight, keep it off, and maintain that loss, even after two years. Use it to lose it. Step on the scale at the same time every day for the best results. If your weight shoots up several pounds from one weigh-in to the next, don't freak out. Eating a lot of salt the night before or having your period is the likely culprit. The number should return to normal in a day or two. It's a steady climb that you need to do something about.   9. Too Much Stress and Too Little Sleep Are Your Enemies When you're tired and frazzled, your body cranks up the production of cortisol, the stress hormone that can cause carb cravings. Not getting enough sleep also boosts your levels of ghrelin, a hormone associated with hunger, while suppressing leptin, a hormone that signals fullness and satiety. People on a diet who slept only five and a half hours a night for two weeks lost 55 percent less fat and were hungrier than those who slept eight and a half hours, according to a study in the [place]. Use it to lose it. Prioritize sleep, aiming for seven hours or more a night, which research shows helps lower stress. And make sure you're getting quality zzz's. If a snoring spouse or a fidgety cat wakes you up frequently throughout the night, you may end up getting the equivalent of just four hours of sleep, according to a study from [facility]. Keep pets out of the bedroom, and use a white-noise app to drown out snoring. 10. You Will Hit a plateau-And You Can Bust Through It As you slim down, your  body releases much less leptin, the fullness hormone.  If you're not strength training, start right now. Building muscle can raise your metabolism to help you overcome a plateau. To keep your body challenged and burning calories, incorporate new moves and more intense intervals into your workouts or add another sweat session to your weekly routine. Alternatively, cut an extra 100 calories or so a day from your diet. Now that you've lost weight, your body simply doesn't need as much fuel.    Urinary Incontinence Urinary incontinence is the involuntary loss of urine from your bladder. CAUSES  There are many causes of urinary incontinence. They include:  Medicines.  Infections.  Prostatic enlargement, leading to overflow of urine from your bladder.  Surgery.  Neurological diseases.  Emotional factors. SIGNS AND SYMPTOMS Urinary Incontinence can be divided into four types: 1. Urge incontinence. Urge incontinence is the involuntary loss of urine before you have the opportunity to go to the bathroom. There is a sudden urge to void but not enough time to reach a bathroom. 2. Stress incontinence. Stress incontinence is the sudden loss of urine with any activity that forces urine to pass. It is commonly caused by anatomical changes to the pelvis and sphincter areas of your body. 3. Overflow incontinence. Overflow incontinence is the loss of urine from an obstructed opening to your bladder. This results in a backup of urine and a resultant buildup of pressure within the bladder. When the pressure within the bladder exceeds the closing pressure of the sphincter, the urine overflows, which causes incontinence, similar to water overflowing a dam. 4. Total incontinence. Total incontinence is the loss of urine as a result of the inability to store urine within your bladder. DIAGNOSIS  Evaluating the cause of incontinence may require:  A thorough and complete medical and obstetric history.  A complete  physical exam.  Laboratory tests such as a urine culture and sensitivities. When additional tests are indicated, they can include:  An ultrasound exam.  Kidney and bladder X-rays.  Cystoscopy. This is an exam of the bladder using a narrow scope.  Urodynamic testing to test the nerve function to the bladder and sphincter areas. TREATMENT  Treatment for urinary incontinence depends on the cause:  For urge incontinence caused by a bacterial infection, antibiotics will be prescribed. If the urge incontinence is related to medicines  you take, your health care provider may have you change the medicine.  For stress incontinence, surgery to re-establish anatomical support to the bladder or sphincter, or both, will often correct the condition.  For overflow incontinence caused by an enlarged prostate, an operation to open the channel through the enlarged prostate will allow the flow of urine out of the bladder. In women with fibroids, a hysterectomy may be recommended.  For total incontinence, surgery on your urinary sphincter may help. An artificial urinary sphincter (an inflatable cuff placed around the urethra) may be required. In women who have developed a hole-like passage between their bladder and vagina (vesicovaginal fistula), surgery to close the fistula often is required. HOME CARE INSTRUCTIONS  Normal daily hygiene and the use of pads or adult diapers that are changed regularly will help prevent odors and skin damage.  Avoid caffeine. It can overstimulate your bladder.  Use the bathroom regularly. Try about every 2-3 hours to go to the bathroom, even if you do not feel the need to do so. Take time to empty your bladder completely. After urinating, wait a minute. Then try to urinate again.  For causes involving nerve dysfunction, keep a log of the medicines you take and a journal of the times you go to the bathroom. SEEK MEDICAL CARE IF:  You experience worsening of pain instead of  improvement in pain after your procedure.  Your incontinence becomes worse instead of better. SEE IMMEDIATE MEDICAL CARE IF:  You experience fever or shaking chills.  You are unable to pass your urine.  You have redness spreading into your groin or down into your thighs. MAKE SURE YOU:   Understand these instructions.   Will watch your condition.  Will get help right away if you are not doing well or get worse. Document Released: 10/26/2004 Document Revised: 07/09/2013 Document Reviewed: 02/25/2013 [facility] Patient Information 2015 [place], [place]. This information is not intended to replace advice given to you by your health care provider. Make sure you discuss any questions you have with your health care provider.

## 2015-01-30 LAB — URINE CULTURE
Colony Count: NO GROWTH
Organism ID, Bacteria: NO GROWTH

## 2015-01-30 LAB — URINALYSIS, ROUTINE W REFLEX MICROSCOPIC
Bilirubin Urine: NEGATIVE
Glucose, UA: NEGATIVE mg/dL
Hgb urine dipstick: NEGATIVE
KETONES UR: NEGATIVE mg/dL
Leukocytes, UA: NEGATIVE
NITRITE: NEGATIVE
PROTEIN: NEGATIVE mg/dL
Specific Gravity, Urine: 1.012 (ref 1.005–1.030)
Urobilinogen, UA: 0.2 mg/dL (ref 0.0–1.0)
pH: 5 (ref 5.0–8.0)

## 2015-01-30 LAB — LIPID PANEL
Cholesterol: 202 mg/dL — ABNORMAL HIGH (ref 0–200)
HDL: 47 mg/dL (ref >=46)
LDL Cholesterol: 116 mg/dL — ABNORMAL HIGH (ref 0–99)
Total CHOL/HDL Ratio: 4.3 Ratio
Triglycerides: 197 mg/dL — ABNORMAL HIGH (ref <150)
VLDL: 39 mg/dL (ref 0–40)

## 2015-01-30 LAB — VITAMIN D 25 HYDROXY (VIT D DEFICIENCY, FRACTURES): Vit D, 25-Hydroxy: 20 ng/mL — ABNORMAL LOW (ref 30–100)

## 2015-01-30 LAB — TSH: TSH: 1.597 u[IU]/mL (ref 0.350–4.500)

## 2015-01-30 LAB — MAGNESIUM: MAGNESIUM: 1.9 mg/dL (ref 1.5–2.5)

## 2015-01-30 LAB — INSULIN, FASTING: INSULIN FASTING, SERUM: 7 u[IU]/mL (ref 2.0–19.6)

## 2015-08-19 ENCOUNTER — Encounter: Payer: Self-pay | Admitting: Physician Assistant

## 2015-10-21 ENCOUNTER — Encounter: Payer: Self-pay | Admitting: Physician Assistant

## 2015-12-09 ENCOUNTER — Encounter: Payer: Self-pay | Admitting: Physician Assistant

## 2015-12-09 ENCOUNTER — Other Ambulatory Visit (HOSPITAL_COMMUNITY)
Admission: RE | Admit: 2015-12-09 | Discharge: 2015-12-09 | Disposition: A | Discharge status: Home / Self Care | Payer: 59 | Source: Ambulatory Visit | Attending: Physician Assistant | Admitting: Physician Assistant

## 2015-12-09 ENCOUNTER — Ambulatory Visit (INDEPENDENT_AMBULATORY_CARE_PROVIDER_SITE_OTHER): Payer: 59 | Admitting: Physician Assistant

## 2015-12-09 VITALS — BP 114/72 | HR 74 | Temp 97.5°F | Resp 16 | Ht 64.0 in | Wt 157.2 lb

## 2015-12-09 DIAGNOSIS — I10 Essential (primary) hypertension: Secondary | ICD-10-CM | POA: Diagnosis not present

## 2015-12-09 DIAGNOSIS — R7303 Prediabetes: Secondary | ICD-10-CM | POA: Diagnosis not present

## 2015-12-09 DIAGNOSIS — Z124 Encounter for screening for malignant neoplasm of cervix: Secondary | ICD-10-CM

## 2015-12-09 DIAGNOSIS — F329 Major depressive disorder, single episode, unspecified: Secondary | ICD-10-CM

## 2015-12-09 DIAGNOSIS — E785 Hyperlipidemia, unspecified: Secondary | ICD-10-CM

## 2015-12-09 DIAGNOSIS — Z0001 Encounter for general adult medical examination with abnormal findings: Secondary | ICD-10-CM | POA: Diagnosis not present

## 2015-12-09 DIAGNOSIS — Z1151 Encounter for screening for human papillomavirus (HPV): Secondary | ICD-10-CM | POA: Insufficient documentation

## 2015-12-09 DIAGNOSIS — M1990 Unspecified osteoarthritis, unspecified site: Secondary | ICD-10-CM | POA: Diagnosis not present

## 2015-12-09 DIAGNOSIS — K649 Unspecified hemorrhoids: Secondary | ICD-10-CM

## 2015-12-09 DIAGNOSIS — Z79899 Other long term (current) drug therapy: Secondary | ICD-10-CM | POA: Diagnosis not present

## 2015-12-09 DIAGNOSIS — L4050 Arthropathic psoriasis, unspecified: Secondary | ICD-10-CM

## 2015-12-09 DIAGNOSIS — E559 Vitamin D deficiency, unspecified: Secondary | ICD-10-CM | POA: Diagnosis not present

## 2015-12-09 DIAGNOSIS — F32A Depression, unspecified: Secondary | ICD-10-CM

## 2015-12-09 DIAGNOSIS — Z23 Encounter for immunization: Secondary | ICD-10-CM

## 2015-12-09 DIAGNOSIS — R6889 Other general symptoms and signs: Secondary | ICD-10-CM | POA: Diagnosis not present

## 2015-12-09 DIAGNOSIS — E538 Deficiency of other specified B group vitamins: Secondary | ICD-10-CM

## 2015-12-09 HISTORY — DX: Arthropathic psoriasis, unspecified: L40.50

## 2015-12-09 LAB — CBC WITH DIFFERENTIAL/PLATELET
BASOS ABS: 0 10*3/uL (ref 0.0–0.1)
Basophils Relative: 0 % (ref 0–1)
Eosinophils Absolute: 0.4 10*3/uL (ref 0.0–0.7)
Eosinophils Relative: 6 % — ABNORMAL HIGH (ref 0–5)
HCT: 39 % (ref 36.0–46.0)
Hemoglobin: 13.3 g/dL (ref 12.0–15.0)
LYMPHS ABS: 1.6 10*3/uL (ref 0.7–4.0)
LYMPHS PCT: 27 % (ref 12–46)
MCH: 30.1 pg (ref 26.0–34.0)
MCHC: 34.1 g/dL (ref 30.0–36.0)
MCV: 88.2 fL (ref 78.0–100.0)
MPV: 10.6 fL (ref 8.6–12.4)
Monocytes Absolute: 0.4 10*3/uL (ref 0.1–1.0)
Monocytes Relative: 7 % (ref 3–12)
NEUTROS PCT: 60 % (ref 43–77)
Neutro Abs: 3.6 10*3/uL (ref 1.7–7.7)
Platelets: 290 10*3/uL (ref 150–400)
RBC: 4.42 MIL/uL (ref 3.87–5.11)
RDW: 12.8 % (ref 11.5–15.5)
WBC: 6 10*3/uL (ref 4.0–10.5)

## 2015-12-09 MED ORDER — BUPROPION HCL ER (XL) 150 MG PO TB24: 150.0000 mg | ORAL_TABLET | ORAL | Status: DC

## 2015-12-09 NOTE — Patient Instructions (Addendum)
[facility]  7 a.m.-6:30 p.m., Monday 7 a.m.-5 p.m., Tuesday-Friday Schedule an appointment by calling [phone].   Encourage you to get the 3D Mammogram  The 3D Mammogram is much more specific and sensitive to pick up breast cancer. For women with fibrocystic breast or lumpy breast it can be hard to determine if it is cancer or not but the 3D mammogram is able to tell this difference which cuts back on unneeded additional tests or scary call backs.   - over 40% increase in detection of breast cancer - over 40% reduction in false positives.  - fewer call backs - reduced anxiety - improved outcomes - [facility]  VAGINAL DRYNESS OVERVIEW  Vaginal dryness, also known as atrophic vaginitis, is a common condition in postmenopausal women. This condition is also common in women who have had both ovaries removed at the time of hysterectomy.   Some women have uncomfortable symptoms of vaginal dryness, such as pain with sex, burning vaginal discomfort or itching, or abnormal vaginal discharge, while others have no symptoms at all.  VAGINAL DRYNESS CAUSES   Estrogen helps to keep the vagina moist and to maintain thickness of the vaginal lining. Vaginal dryness occurs when the ovaries produce a decreased amount of estrogen. This can occur at certain times in a woman's life, and may be permanent or temporary. Times when less estrogen is made include: ?At the time of menopause. ?After surgical removal of the ovaries, chemotherapy, or radiation therapy of the pelvis for cancer. ?After having a baby, particularly in women who breastfeed. ?While using certain medications, such as danazol, medroxyprogesterone (brand names: Provera or DepoProvera), leuprolide (brand name: Lupron), or nafarelin. When these medications are stopped, estrogen production resumes.  Women who smoke cigarettes have been shown to have an increased risk of an earlier menopause transition as  compared to non-smokers. Therefore, atrophic vaginitis symptoms may appear at a younger age in this population.  VAGINAL DRYNESS TREATMENT   There are three treatment options for women with vaginal dryness:  Vaginal lubricants and moisturizers - Vaginal lubricants and moisturizers can be purchased without a prescription. These products do not contain any hormones and have virtually no side effects. - Albolene is found in the facial cleanser section at [facility], [facility], or [employer]. It is a large jar with a blue top. This is the best lubricant for women because it is hypoallergenic. -Natural lubricants, such as olive, avocado or peanut oil, are easily available products that may be used as a lubricant with sex.  -Vaginal moisturizes (eg, Replens, Moist Again, Vagisil, K-Y Silk-E, and Feminease) are formulated to allow water to be retained in the vaginal tissues. Moisturizers are applied into the vagina three times weekly to allow a continued moisturizing effect. These should not be used just before having sex, as they can be irritating.  Vaginal estrogen - Vaginal estrogen is the most effective treatment option for women with vaginal dryness. Vaginal estrogen must be prescribed by a healthcare provider. Very low doses of vaginal estrogen can be used when it is put into the vagina to treat vaginal dryness. A small amount of estrogen is absorbed into the bloodstream, but only about 100 times less than when using estrogen pills or tablets. As a result, there is a much lower risk of side effects, such as blood clots, breast cancer, and heart attack, compared with other estrogen-containing products (birth control pills, menopausal hormone therapy).   Ospemifene - Ospemifene is a prescription medication  that is similar to estrogen, but is not estrogen. In the vaginal tissue, it acts similarly to estrogen. In the breast tissue, it acts as an estrogen blocker. It comes in a pill, and is prescribed for women who  want to use an estrogen-like medication for vaginal dryness or painful sex associated with vaginal dryness, but prefer not to use a vaginal medication. The medication may cause hot flashes as a side effect. This type of medication may increase the risk of blood clots or uterine cancer. Further study of ospemifene is needed to evaluate the risk of these complications. This medication has not been tested in women who have had breast cancer or are at a high risk of developing breast cancer.    Sexual activity - Vaginal estrogen improves vaginal dryness quickly, usually within a few weeks. You may continue to have sex as you treat vaginal dryness because sex itself can help to keep the vaginal tissues healthy. Vaginal intercourse may help the vaginal tissues by keeping them soft and stretchable and preventing the tissues from shrinking.  If sex continues to be painful despite treatment for vaginal dryness, talk to your healthcare provider.     Preventive Care for Adults  A healthy lifestyle and preventive care can promote health and wellness. Preventive health guidelines for women include the following key practices.  A routine yearly physical is a good way to check with your health care provider about your health and preventive screening. It is a chance to share any concerns and updates on your health and to receive a thorough exam.  Visit your dentist for a routine exam and preventive care every 6 months. Brush your teeth twice a day and floss once a day. Good oral hygiene prevents tooth decay and gum disease.  The frequency of eye exams is based on your age, health, family medical history, use of contact lenses, and other factors. Follow your health care provider's recommendations for frequency of eye exams.  Eat a healthy diet. Foods like vegetables, fruits, whole grains, low-fat dairy products, and lean protein foods contain the nutrients you need without too many calories. Decrease your intake of  foods high in solid fats, added sugars, and salt. Eat the right amount of calories for you.Get information about a proper diet from your health care provider, if necessary.  Regular physical exercise is one of the most important things you can do for your health. Most adults should get at least 150 minutes of moderate-intensity exercise (any activity that increases your heart rate and causes you to sweat) each week. In addition, most adults need muscle-strengthening exercises on 2 or more days a week.  Maintain a healthy weight. The body mass index (BMI) is a screening tool to identify possible weight problems. It provides an estimate of body fat based on height and weight. Your health care provider can find your BMI and can help you achieve or maintain a healthy weight.For adults 20 years and older:  A BMI below 18.5 is considered underweight.  A BMI of 18.5 to 24.9 is normal.  A BMI of 25 to 29.9 is considered overweight.  A BMI of 30 and above is considered obese.  Maintain normal blood lipids and cholesterol levels by exercising and minimizing your intake of saturated fat. Eat a balanced diet with plenty of fruit and vegetables. Blood tests for lipids and cholesterol should begin at age 71 and be repeated every 5 years. If your lipid or cholesterol levels are high, you are over  50, or you are at high risk for heart disease, you may need your cholesterol levels checked more frequently.Ongoing high lipid and cholesterol levels should be treated with medicines if diet and exercise are not working.  If you smoke, find out from your health care provider how to quit. If you do not use tobacco, do not start.  Lung cancer screening is recommended for adults aged 17-80 years who are at high risk for developing lung cancer because of a history of smoking. A yearly low-dose CT scan of the lungs is recommended for people who have at least a 30-pack-year history of smoking and are a current smoker or  have quit within the past 15 years. A pack year of smoking is smoking an average of 1 pack of cigarettes a day for 1 year (for example: 1 pack a day for 30 years or 2 packs a day for 15 years). Yearly screening should continue until the smoker has stopped smoking for at least 15 years. Yearly screening should be stopped for people who develop a health problem that would prevent them from having lung cancer treatment.  High blood pressure causes heart disease and increases the risk of stroke. Your blood pressure should be checked at least every 1 to 2 years. Ongoing high blood pressure should be treated with medicines if weight loss and exercise do not work.  If you are 103-67 years old, ask your health care provider if you should take aspirin to prevent strokes.  Diabetes screening involves taking a blood sample to check your fasting blood sugar level. This should be done once every 3 years, after age 21, if you are within normal weight and without risk factors for diabetes. Testing should be considered at a younger age or be carried out more frequently if you are overweight and have at least 1 risk factor for diabetes.  Breast cancer screening is essential preventive care for women. You should practice "breast self-awareness." This means understanding the normal appearance and feel of your breasts and may include breast self-examination. Any changes detected, no matter how small, should be reported to a health care provider. Women in their 69s and 30s should have a clinical breast exam (CBE) by a health care provider as part of a regular health exam every 1 to 3 years. After age 41, women should have a CBE every year. Starting at age 23, women should consider having a mammogram (breast X-ray test) every year. Women who have a family history of breast cancer should talk to their health care provider about genetic screening. Women at a high risk of breast cancer should talk to their health care providers about  having an MRI and a mammogram every year.  Breast cancer gene (BRCA)-related cancer risk assessment is recommended for women who have family members with BRCA-related cancers. BRCA-related cancers include breast, ovarian, tubal, and peritoneal cancers. Having family members with these cancers may be associated with an increased risk for harmful changes (mutations) in the breast cancer genes BRCA1 and BRCA2. Results of the assessment will determine the need for genetic counseling and BRCA1 and BRCA2 testing.  Routine pelvic exams to screen for cancer are no longer recommended for nonpregnant women who are considered low risk for cancer of the pelvic organs (ovaries, uterus, and vagina) and who do not have symptoms. Ask your health care provider if a screening pelvic exam is right for you.  If you have had past treatment for cervical cancer or a condition that could lead  to cancer, you need Pap tests and screening for cancer for at least 20 years after your treatment. If Pap tests have been discontinued, your risk factors (such as having a new sexual partner) need to be reassessed to determine if screening should be resumed. Some women have medical problems that increase the chance of getting cervical cancer. In these cases, your health care provider may recommend more frequent screening and Pap tests.  Colorectal cancer can be detected and often prevented. Most routine colorectal cancer screening begins at the age of 90 years and continues through age 49 years. However, your health care provider may recommend screening at an earlier age if you have risk factors for colon cancer. On a yearly basis, your health care provider may provide home test kits to check for hidden blood in the stool. Use of a small camera at the end of a tube, to directly examine the colon (sigmoidoscopy or colonoscopy), can detect the earliest forms of colorectal cancer. Talk to your health care provider about this at age 62, when  routine screening begins. Direct exam of the colon should be repeated every 5-10 years through age 28 years, unless early forms of pre-cancerous polyps or small growths are found.  Hepatitis C blood testing is recommended for all people born from [birth year] through 1965 and any individual with known risks for hepatitis C.  Pra  Osteoporosis is a disease in which the bones lose minerals and strength with aging. This can result in serious bone fractures or breaks. The risk of osteoporosis can be identified using a bone density scan. Women ages 105 years and over and women at risk for fractures or osteoporosis should discuss screening with their health care providers. Ask your health care provider whether you should take a calcium supplement or vitamin D to reduce the rate of osteoporosis.  Menopause can be associated with physical symptoms and risks. Hormone replacement therapy is available to decrease symptoms and risks. You should talk to your health care provider about whether hormone replacement therapy is right for you.  Use sunscreen. Apply sunscreen liberally and repeatedly throughout the day. You should seek shade when your shadow is shorter than you. Protect yourself by wearing long sleeves, pants, a wide-brimmed hat, and sunglasses year round, whenever you are outdoors.  Once a month, do a whole body skin exam, using a mirror to look at the skin on your back. Tell your health care provider of new moles, moles that have irregular borders, moles that are larger than a pencil eraser, or moles that have changed in shape or color.  Stay current with required vaccines (immunizations).  Influenza vaccine. All adults should be immunized every year.  Tetanus, diphtheria, and acellular pertussis (Td, Tdap) vaccine. Pregnant women should receive 1 dose of Tdap vaccine during each pregnancy. The dose should be obtained regardless of the length of time since the last dose. Immunization is preferred during the  27th-36th week of gestation. An adult who has not previously received Tdap or who does not know her vaccine status should receive 1 dose of Tdap. This initial dose should be followed by tetanus and diphtheria toxoids (Td) booster doses every 10 years. Adults with an unknown or incomplete history of completing a 3-dose immunization series with Td-containing vaccines should begin or complete a primary immunization series including a Tdap dose. Adults should receive a Td booster every 10 years.  Varicella vaccine. An adult without evidence of immunity to varicella should receive 2 doses or a second  dose if she has previously received 1 dose. Pregnant females who do not have evidence of immunity should receive the first dose after pregnancy. This first dose should be obtained before leaving the health care facility. The second dose should be obtained 4-8 weeks after the first dose.  Human papillomavirus (HPV) vaccine. Females aged 13-26 years who have not received the vaccine previously should obtain the 3-dose series. The vaccine is not recommended for use in pregnant females. However, pregnancy testing is not needed before receiving a dose. If a female is found to be pregnant after receiving a dose, no treatment is needed. In that case, the remaining doses should be delayed until after the pregnancy. Immunization is recommended for any person with an immunocompromised condition through the age of 61 years if she did not get any or all doses earlier. During the 3-dose series, the second dose should be obtained 4-8 weeks after the first dose. The third dose should be obtained 24 weeks after the first dose and 16 weeks after the second dose.  Zoster vaccine. One dose is recommended for adults aged 27 years or older unless certain conditions are present.  Measles, mumps, and rubella (MMR) vaccine. Adults born before [birth year] generally are considered immune to measles and mumps. Adults born in [birth year] or later should have  1 or more doses of MMR vaccine unless there is a contraindication to the vaccine or there is laboratory evidence of immunity to each of the three diseases. A routine second dose of MMR vaccine should be obtained at least 28 days after the first dose for students attending postsecondary schools, health care workers, or international travelers. People who received inactivated measles vaccine or an unknown type of measles vaccine during 1963-1967 should receive 2 doses of MMR vaccine. People who received inactivated mumps vaccine or an unknown type of mumps vaccine before 1979 and are at high risk for mumps infection should consider immunization with 2 doses of MMR vaccine. For females of childbearing age, rubella immunity should be determined. If there is no evidence of immunity, females who are not pregnant should be vaccinated. If there is no evidence of immunity, females who are pregnant should delay immunization until after pregnancy. Unvaccinated health care workers born before [birth year] who lack laboratory evidence of measles, mumps, or rubella immunity or laboratory confirmation of disease should consider measles and mumps immunization with 2 doses of MMR vaccine or rubella immunization with 1 dose of MMR vaccine.  Pneumococcal 13-valent conjugate (PCV13) vaccine. When indicated, a person who is uncertain of her immunization history and has no record of immunization should receive the PCV13 vaccine. An adult aged 53 years or older who has certain medical conditions and has not been previously immunized should receive 1 dose of PCV13 vaccine. This PCV13 should be followed with a dose of pneumococcal polysaccharide (PPSV23) vaccine. The PPSV23 vaccine dose should be obtained at least 8 weeks after the dose of PCV13 vaccine. An adult aged 67 years or older who has certain medical conditions and previously received 1 or more doses of PPSV23 vaccine should receive 1 dose of PCV13. The PCV13 vaccine dose should be  obtained 1 or more years after the last PPSV23 vaccine dose.    Pneumococcal polysaccharide (PPSV23) vaccine. When PCV13 is also indicated, PCV13 should be obtained first. All adults aged 62 years and older should be immunized. An adult younger than age 53 years who has certain medical conditions should be immunized. Any person who resides in a nursing  home or long-term care facility should be immunized. An adult smoker should be immunized. People with an immunocompromised condition and certain other conditions should receive both PCV13 and PPSV23 vaccines. People with human immunodeficiency virus (HIV) infection should be immunized as soon as possible after diagnosis. Immunization during chemotherapy or radiation therapy should be avoided. Routine use of PPSV23 vaccine is not recommended for American Indians, [place], or people younger than 65 years unless there are medical conditions that require PPSV23 vaccine. When indicated, people who have unknown immunization and have no record of immunization should receive PPSV23 vaccine. One-time revaccination 5 years after the first dose of PPSV23 is recommended for people aged 19-64 years who have chronic kidney failure, nephrotic syndrome, asplenia, or immunocompromised conditions. People who received 1-2 doses of PPSV23 before age 56 years should receive another dose of PPSV23 vaccine at age 29 years or later if at least 5 years have passed since the previous dose. Doses of PPSV23 are not needed for people immunized with PPSV23 at or after age 72 years.  Preventive Services / Frequency   Ages 63 to 51 years  Blood pressure check.  Lipid and cholesterol check.  Lung cancer screening. / Every year if you are aged 48-80 years and have a 30-pack-year history of smoking and currently smoke or have quit within the past 15 years. Yearly screening is stopped once you have quit smoking for at least 15 years or develop a health problem that would prevent  you from having lung cancer treatment.  Clinical breast exam.** / Every year after age 67 years.  BRCA-related cancer risk assessment.** / For women who have family members with a BRCA-related cancer (breast, ovarian, tubal, or peritoneal cancers).  Mammogram.** / Every year beginning at age 50 years and continuing for as long as you are in good health. Consult with your health care provider.  Pap test.** / Every 3 years starting at age 38 years through age 17 or 3 years with a history of 3 consecutive normal Pap tests.  HPV screening.** / Every 3 years from ages 37 years through ages 33 to 29 years with a history of 3 consecutive normal Pap tests.  Fecal occult blood test (FOBT) of stool. / Every year beginning at age 67 years and continuing until age 43 years. You may not need to do this test if you get a colonoscopy every 10 years.  Flexible sigmoidoscopy or colonoscopy.** / Every 5 years for a flexible sigmoidoscopy or every 10 years for a colonoscopy beginning at age 83 years and continuing until age 22 years.  Hepatitis C blood test.** / For all people born from [birth year] through 1965 and any individual with known risks for hepatitis C.  Skin self-exam. / Monthly.  Influenza vaccine. / Every year.  Tetanus, diphtheria, and acellular pertussis (Tdap/Td) vaccine.** / Consult your health care provider. Pregnant women should receive 1 dose of Tdap vaccine during each pregnancy. 1 dose of Td every 10 years.  Varicella vaccine.** / Consult your health care provider. Pregnant females who do not have evidence of immunity should receive the first dose after pregnancy.  Zoster vaccine.** / 1 dose for adults aged 35 years or older.  Pneumococcal 13-valent conjugate (PCV13) vaccine.** / Consult your health care provider.  Pneumococcal polysaccharide (PPSV23) vaccine.** / 1 to 2 doses if you smoke cigarettes or if you have certain conditions.  Meningococcal vaccine.** / Consult your health care  provider.  Hepatitis A vaccine.** / Consult your health care provider.  Hepatitis B vaccine.** / Consult your health care provider. Screening for abdominal aortic aneurysm (AAA)  by ultrasound is recommended for people over 50 who have history of high blood pressure or who are current or former smokers.

## 2015-12-09 NOTE — Progress Notes (Signed)
Complete Physical  Assessment and Plan: 1. Prediabetes - CBC with Differential/Platelet - BASIC METABOLIC PANEL WITH GFR - Hepatic function panel - Hemoglobin A1c - Insulin, fasting  2. Hyperlipidemia - TSH - Lipid panel  3. Psoriatic arthritis ([place]) Continue follow up  4. Vitamin D deficiency - VITAMIN D 25 Hydroxy (Vit-D Deficiency, Fractures)  5. Encounter for long-term (current) use of medications - Magnesium  6. B12 deficiency - Iron and TIBC - Ferritin - Vitamin B12  7. Osteoarthritis, unspecified osteoarthritis type, unspecified site Continue follow up  8. Hemorrhoids, unspecified hemorrhoid type Continue GI follow up, increase water/fiber.   9. Essential hypertension - Urinalysis, Routine w reflex microscopic (not at [facility]) - Microalbumin / creatinine urine ratio - EKG 12-Lead - [place], RETROPERITNL ABD,  LTD  10. Pap smear for cervical cancer screening - Cytology - PAP  11. Need for prophylactic vaccination with combined diphtheria-tetanus-pertussis (DTP) vaccine - Tdap vaccine greater than or equal to 7yo IM  12. Depression Follow up with counseling, no SI/HI, close follow up here 4-6 weeks.  - buPROPion (WELLBUTRIN XL) 150 MG 24 hr tablet; Take 1 tablet (150 mg total) by mouth every morning.  Dispense: 30 tablet; Refill: 2  13. Need for MGM Patient declines diagnostic MGM at this time due to cost, willing to get 3D MGM, will call to schedule ASAP  Discussed med's effects and SE's. Screening labs and tests as requested with regular follow-up as recommended. Over 40 minutes of exam, counseling, chart review, and complex, high level critical decision making was performed this visit.   HPI  60 y.o. female  presents for a complete physical.  She has psoriatic arthritis and is seeing Dr. Amil Amen, she was started on methotrexate but this elevated her liver function so she was just recently put on otezal and states she is doing better, still has joint pain.    She has been following with [facility], has had several neck and right shoulder surgery since that time, found ot have chest mass that was benign.  Her blood pressure has been controlled at home, today their BP is BP: 114/72 mmHg  She does not workout. She denies chest pain, shortness of breath, dizziness.  She is not on cholesterol medication and denies myalgias. Her cholesterol is at goal. The cholesterol last visit was:   Lab Results  Component Value Date   CHOL 202* 01/29/2015   HDL 47 01/29/2015   LDLCALC 116* 01/29/2015   TRIG 197* 01/29/2015   CHOLHDL 4.3 01/29/2015   She has been working on diet and exercise for prediabetes, she is not on bASA, she is not on ACE/ARB and denies paresthesia of the feet, polydipsia, polyuria and visual disturbances. Last A1C in the office was:  Lab Results  Component Value Date   HGBA1C 6.1* 01/29/2015  Last GFR:  Lab Results  Component Value Date   GFRNONAA 80 07/28/2014  Patient is on Vitamin D supplement.   Lab Results  Component Value Date   VD25OH 20* 01/29/2015     Daughter, Cloyde Reams, passed 1 year ago in June from pneumonia, was suppose to be getting married, her birthday is tomorrow, mom is 54. She states she never sleeps due to thinking of her daughter, did do hospice grief counseling. She still has a lot of depression, cries easiily.  BMI is Body mass index is 26.97 kg/(m^2)., she is working on diet and exercise. Wt Readings from Last 3 Encounters:  12/09/15 157 lb 3.2 oz (71.305 kg)  01/29/15  159 lb (72.122 kg)  07/28/14 157 lb (71.215 kg)     Current Medications:  Current Outpatient Prescriptions on File Prior to Visit  Medication Sig Dispense Refill  . acetaminophen (TYLENOL) 500 MG tablet Take by mouth every 6 (six) hours as needed.     No current facility-administered medications on file prior to visit.   Health Maintenance:   Tetanus: 10+ years Pneumovax: N/A Prevnar 13: N/A Flu vaccine: declines Zostavax: will check with  Dr. Amil Amen  LMP: 20+ years s/p TAH Pap: 2012 MGM: 12/2012 DUE DEXA: never Colonoscopy: 2014  Patient Care Team: Unk Pinto, MD as PCP - General (Internal Medicine) Hennie Duos, MD as Consulting Physician (Rheumatology)  Allergies: No Known Allergies Medical History:  Past Medical History  Diagnosis Date  . Seasonal allergies   . Mass of chest wall 12/2012    tender to the touch  . Allergy   . Osteoarthritis    Surgical History:  Past Surgical History  Procedure Laterality Date  . Cholecystectomy    . Shoulder surgery  03/2012    debridement and partial clavical removal  . Cervical fusion  2005  . Anterior cervical decomp/discectomy fusion  08/08/2012    Procedure: ANTERIOR CERVICAL DECOMPRESSION/DISCECTOMY FUSION 2 LEVEL/HARDWARE REMOVAL;  Surgeon: Eustace Moore, MD;  Location: [facility];  Service: Neurosurgery;  Laterality: N/A;  anterior cervical five-six to six seven decompression fusion with removal plate four-five  . Total shoulder arthroplasty  09/24/2012    Procedure: TOTAL SHOULDER ARTHROPLASTY;  Surgeon: Johnny Bridge, MD;  Location: [facility];  Service: Orthopedics;  Laterality: Right;  . Eye surgery      for dry eye syndrome  . Lumbar laminectomy/decompression microdiscectomy  01/01/2013    L4-5  . Lumbar fusion  01/01/2013    L4-5  . Abdominal hysterectomy  1997    complete  . Mass excision N/A 01/29/2013    Procedure: EXCISION of chest wall mass 8 cm;  Surgeon: Stark Klein, MD;  Location: [place];  Service: General;  Laterality: N/A;  . Hemorrhoid surgery N/A 09/02/2013    Procedure: HEMORRHOIDECTOMY;  Surgeon: Joyice Faster. Cornett, MD;  Location: [place];  Service: General;  Laterality: N/A;   Family History:  Family History  Problem Relation Age of Onset  . Pancreatitis Father    Social History:  Social History  Substance Use Topics  . Smoking status: Never Smoker   . Smokeless tobacco: Never Used  . Alcohol  Use: No    Review of Systems: Review of Systems  Constitutional: Positive for malaise/fatigue. Negative for fever, chills, weight loss and diaphoresis.  Eyes: Negative.   Respiratory: Negative.   Cardiovascular: Positive for palpitations. Negative for chest pain, orthopnea, claudication, leg swelling and PND.  Gastrointestinal: Negative.   Genitourinary: Negative.        + incontinence  Musculoskeletal: Positive for myalgias, back pain, joint pain and neck pain. Negative for falls.  Skin: Negative.   Neurological: Negative.  Negative for dizziness and weakness.  Psychiatric/Behavioral: Positive for depression. Negative for suicidal ideas, hallucinations, memory loss and substance abuse. The patient has insomnia. The patient is not nervous/anxious.     Physical Exam: Estimated body mass index is 26.97 kg/(m^2) as calculated from the following:   Height as of this encounter: 5\' 4"  (1.626 m).   Weight as of this encounter: 157 lb 3.2 oz (71.305 kg). BP 114/72 mmHg  Pulse 74  Temp(Src) 97.5 F (36.4 C) (Temporal)  Resp 16  Ht 5\' 4"  (1.626 m)  Wt 157 lb 3.2 oz (71.305 kg)  BMI 26.97 kg/m2  SpO2 98% General Appearance: Well nourished, in no apparent distress.  Eyes: PERRLA, EOMs, conjunctiva no swelling or erythema, normal fundi and vessels.  Sinuses: No Frontal/maxillary tenderness  ENT/Mouth: Ext aud canals clear, normal light reflex with TMs without erythema, bulging. Good dentition. No erythema, swelling, or exudate on post pharynx. Tonsils not swollen or erythematous. Hearing normal.  Neck: Supple, thyroid normal. No bruits  Respiratory: Respiratory effort normal, BS equal bilaterally without rales, rhonchi, wheezing or stridor.  Cardio: RRR without murmurs, rubs or gallops. Brisk peripheral pulses without edema.  Chest: symmetric, with normal excursions and percussion.  Breasts: Symmetric, well healing reconstructive scars, without nipple discharge, retractions. She does have  several mobile lumps/bumps bilateral breast ? Fibrocystic versus scar tissue however left breast at 5-6 oc lock larger deeper but mobile mass.  Abdomen: Soft, nontender, no guarding, rebound, hernias, masses, or organomegaly.  Lymphatics: Non tender without lymphadenopathy.  Genitourinary: has cervix, + vaginal atrophy, normal external genitalia, vulva, vagina, cervix, uterus and adnexa PAP done today. Musculoskeletal: Full ROM all peripheral extremities,5/5 strength, and normal gait.  Skin: Large scaly erythematous plaques on bilateral legs, feet, arms, and some on chest. Warm, dry without rashes, lesions, ecchymosis. Neuro: Cranial nerves intact, reflexes equal bilaterally. Normal muscle tone, no cerebellar symptoms. Sensation intact.  Psych: Awake and oriented X 3, normal affect, Insight and Judgment appropriate.   EKG: WNL no ST changes. AORTA SCAN: WNL   [name] 9:38 AM [facility]

## 2015-12-10 LAB — HEPATIC FUNCTION PANEL
ALK PHOS: 55 U/L (ref 33–130)
ALT: 16 U/L (ref 6–29)
AST: 18 U/L (ref 10–35)
Albumin: 4.1 g/dL (ref 3.6–5.1)
BILIRUBIN INDIRECT: 0.4 mg/dL (ref 0.2–1.2)
BILIRUBIN TOTAL: 0.5 mg/dL (ref 0.2–1.2)
Bilirubin, Direct: 0.1 mg/dL (ref <=0.2)
Total Protein: 6.6 g/dL (ref 6.1–8.1)

## 2015-12-10 LAB — BASIC METABOLIC PANEL WITH GFR
BUN: 14 mg/dL (ref 7–25)
CHLORIDE: 105 mmol/L (ref 98–110)
CO2: 25 mmol/L (ref 20–31)
Calcium: 9.3 mg/dL (ref 8.6–10.4)
Creat: 0.75 mg/dL (ref 0.50–0.99)
GFR, Est African American: >89 mL/min (ref >=60)
GFR, Est Non African American: 86 mL/min (ref >=60)
Glucose, Bld: 93 mg/dL (ref 65–99)
POTASSIUM: 4.4 mmol/L (ref 3.5–5.3)
Sodium: 139 mmol/L (ref 135–146)

## 2015-12-10 LAB — URINALYSIS, ROUTINE W REFLEX MICROSCOPIC
BILIRUBIN URINE: NEGATIVE
GLUCOSE, UA: NEGATIVE
HGB URINE DIPSTICK: NEGATIVE
Ketones, ur: NEGATIVE
Nitrite: NEGATIVE
PROTEIN: NEGATIVE
Specific Gravity, Urine: 1.018 (ref 1.001–1.035)
pH: 5 (ref 5.0–8.0)

## 2015-12-10 LAB — URINALYSIS, MICROSCOPIC ONLY
Bacteria, UA: NONE SEEN [HPF]
Casts: NONE SEEN [LPF]
Crystals: NONE SEEN [HPF]
Squamous Epithelial / LPF: NONE SEEN [HPF] (ref <=5)
WBC UA: NONE SEEN WBC/HPF (ref <=5)
Yeast: NONE SEEN [HPF]

## 2015-12-10 LAB — LIPID PANEL
CHOL/HDL RATIO: 3.5 ratio (ref <=5.0)
Cholesterol: 179 mg/dL (ref 125–200)
HDL: 51 mg/dL (ref >=46)
LDL CALC: 110 mg/dL (ref <130)
Triglycerides: 91 mg/dL (ref <150)
VLDL: 18 mg/dL (ref <30)

## 2015-12-10 LAB — FERRITIN: FERRITIN: 29 ng/mL (ref 20–288)

## 2015-12-10 LAB — MICROALBUMIN / CREATININE URINE RATIO
Creatinine, Urine: 138 mg/dL (ref 20–320)
Microalb Creat Ratio: 3 mcg/mg creat (ref <30)
Microalb, Ur: 0.4 mg/dL

## 2015-12-10 LAB — HEMOGLOBIN A1C
HEMOGLOBIN A1C: 5.8 % — AB (ref <5.7)
Mean Plasma Glucose: 120 mg/dL — ABNORMAL HIGH (ref <117)

## 2015-12-10 LAB — IRON AND TIBC
%SAT: 22 % (ref 11–50)
IRON: 79 ug/dL (ref 45–160)
TIBC: 363 ug/dL (ref 250–450)
UIBC: 284 ug/dL (ref 125–400)

## 2015-12-10 LAB — INSULIN, FASTING: INSULIN FASTING, SERUM: 8 u[IU]/mL (ref 2.0–19.6)

## 2015-12-10 LAB — MAGNESIUM: Magnesium: 1.9 mg/dL (ref 1.5–2.5)

## 2015-12-10 LAB — TSH: TSH: 1.24 mIU/L

## 2015-12-10 LAB — VITAMIN B12: Vitamin B-12: 255 pg/mL (ref 200–1100)

## 2015-12-10 LAB — VITAMIN D 25 HYDROXY (VIT D DEFICIENCY, FRACTURES): Vit D, 25-Hydroxy: 22 ng/mL — ABNORMAL LOW (ref 30–100)

## 2015-12-13 LAB — CYTOLOGY - PAP

## 2015-12-27 ENCOUNTER — Telehealth: Payer: Self-pay | Admitting: *Deleted

## 2015-12-27 MED ORDER — PROMETHAZINE HCL 25 MG PO TABS: ORAL_TABLET | ORAL | Status: DC

## 2015-12-27 NOTE — Telephone Encounter (Signed)
Patient called and requested an RX for nausea.  She states her co-workers have also been sick.  Per Dr Melford Aase, send in an RX for Phenergan 25 mg.

## 2016-01-07 ENCOUNTER — Ambulatory Visit: Payer: Self-pay | Admitting: Physician Assistant

## 2016-01-11 ENCOUNTER — Encounter: Payer: Self-pay | Admitting: Physician Assistant

## 2016-01-11 ENCOUNTER — Other Ambulatory Visit (INDEPENDENT_AMBULATORY_CARE_PROVIDER_SITE_OTHER): Payer: 59 | Admitting: Internal Medicine

## 2016-01-11 ENCOUNTER — Ambulatory Visit (INDEPENDENT_AMBULATORY_CARE_PROVIDER_SITE_OTHER): Payer: 59 | Admitting: Physician Assistant

## 2016-01-11 VITALS — BP 120/70 | HR 72 | Temp 97.5°F | Resp 16 | Ht 64.0 in | Wt 154.2 lb

## 2016-01-11 DIAGNOSIS — F32A Depression, unspecified: Secondary | ICD-10-CM

## 2016-01-11 DIAGNOSIS — Z1211 Encounter for screening for malignant neoplasm of colon: Secondary | ICD-10-CM

## 2016-01-11 DIAGNOSIS — E559 Vitamin D deficiency, unspecified: Secondary | ICD-10-CM | POA: Diagnosis not present

## 2016-01-11 DIAGNOSIS — F329 Major depressive disorder, single episode, unspecified: Secondary | ICD-10-CM

## 2016-01-11 MED ORDER — BUPROPION HCL ER (XL) 150 MG PO TB24: 150.0000 mg | ORAL_TABLET | ORAL | Status: DC

## 2016-01-11 NOTE — Patient Instructions (Addendum)
   [facility] Schedule an appointment by calling [phone].  Say want screening MGM with 3D  Continue wellbutrin, can increase to 300mg  if you need to or stay on the 150

## 2016-01-11 NOTE — Progress Notes (Signed)
Assessment and Plan: Depression/decreased concentration- continue wellbutrin. 150XL Follow up 3 months Future Appointments Date Time Provider [place]  12/12/2016 9:00 AM [name], PA-C [facility] None    HPI 60 y.o.female presents for 1 month follow up.  Last visit she was started on wellbutrin for depression/anxiety. Has some grief reaction still from her daughter massing a year ago in July.  Vitamin D was low, suppose to start on Vitamin D. She states her appetite is down, her concentration is better, and she has less depression.   Past Medical History  Diagnosis Date  . Seasonal allergies   . Mass of chest wall 12/2012    tender to the touch  . Allergy   . Osteoarthritis      No Known Allergies    Current Outpatient Prescriptions on File Prior to Visit  Medication Sig Dispense Refill  . acetaminophen (TYLENOL) 500 MG tablet Take by mouth every 6 (six) hours as needed.    Marland Kitchen Apremilast (OTEZLA) 30 MG TABS Take 30 mg by mouth daily.    Marland Kitchen buPROPion (WELLBUTRIN XL) 150 MG 24 hr tablet Take 1 tablet (150 mg total) by mouth every morning. 30 tablet 2   No current facility-administered medications on file prior to visit.    ROS: all negative except above.   Physical Exam: Filed Weights   01/11/16 1619  Weight: 154 lb 3.2 oz (69.945 kg)   BP 120/70 mmHg  Pulse 72  Temp(Src) 97.5 F (36.4 C) (Temporal)  Resp 16  Ht 5\' 4"  (1.626 m)  Wt 154 lb 3.2 oz (69.945 kg)  BMI 26.46 kg/m2  SpO2 98% General Appearance: Well nourished, in no apparent distress. Eyes: PERRLA, EOMs, conjunctiva no swelling or erythema Sinuses: No Frontal/maxillary tenderness ENT/Mouth: Ext aud canals clear, TMs without erythema, bulging. No erythema, swelling, or exudate on post pharynx.  Tonsils not swollen or erythematous. Hearing normal.  Neck: Supple, thyroid normal.  Respiratory: Respiratory effort normal, BS equal bilaterally without rales, rhonchi, wheezing or stridor.  Cardio: RRR  with no MRGs. Brisk peripheral pulses without edema.  Abdomen: Soft, + BS.  Non tender, no guarding, rebound, hernias, masses. Lymphatics: Non tender without lymphadenopathy.  Musculoskeletal: Full ROM, 5/5 strength, normal gait.  Skin: Warm, dry without rashes, lesions, ecchymosis.  Neuro: Cranial nerves intact. Normal muscle tone, no cerebellar symptoms. Sensation intact.  Psych: Awake and oriented X 3, normal affect, Insight and Judgment appropriate.     [name], PA-C 4:41 PM [facility]

## 2016-04-17 ENCOUNTER — Encounter: Payer: Self-pay | Admitting: Physician Assistant

## 2016-04-17 ENCOUNTER — Ambulatory Visit (INDEPENDENT_AMBULATORY_CARE_PROVIDER_SITE_OTHER): Payer: 59 | Admitting: Physician Assistant

## 2016-04-17 VITALS — BP 118/68 | HR 73 | Temp 97.3°F | Resp 14 | Ht 64.0 in | Wt 150.6 lb

## 2016-04-17 DIAGNOSIS — R7303 Prediabetes: Secondary | ICD-10-CM | POA: Diagnosis not present

## 2016-04-17 DIAGNOSIS — L4050 Arthropathic psoriasis, unspecified: Secondary | ICD-10-CM

## 2016-04-17 DIAGNOSIS — E785 Hyperlipidemia, unspecified: Secondary | ICD-10-CM | POA: Diagnosis not present

## 2016-04-17 DIAGNOSIS — E559 Vitamin D deficiency, unspecified: Secondary | ICD-10-CM

## 2016-04-17 DIAGNOSIS — F329 Major depressive disorder, single episode, unspecified: Secondary | ICD-10-CM | POA: Diagnosis not present

## 2016-04-17 DIAGNOSIS — F32A Depression, unspecified: Secondary | ICD-10-CM

## 2016-04-17 MED ORDER — BUPROPION HCL ER (XL) 150 MG PO TB24: 150.0000 mg | ORAL_TABLET | ORAL | Status: DC

## 2016-04-17 NOTE — Progress Notes (Signed)
Assessment and Plan:  1. Hypertension -Continue medication, monitor blood pressure at home. Continue DASH diet.  Reminder to go to the ER if any CP, SOB, nausea, dizziness, severe HA, changes vision/speech, left arm numbness and tingling and jaw pain.  2. Cholesterol -Continue diet and exercise. Check cholesterol.   3. Prediabetes  -Continue diet and exercise. Check A1C  4. Vitamin D Def - check level and continue medications.   5. Psoriatic Arthritis-  Follow up with Dr. Amil Amen Switching to Strattera  6. Depression Continue wellbutrin    Continue diet and meds as discussed. Further disposition pending results of labs. Over 30 minutes of exam, counseling, chart review, and critical decision making was performed GETTING LABS AT DR.Amil Amen, WILL CHECK IN 3 MONTHS Future Appointments Date Time Provider [place]  12/12/2016 9:00 AM [name], PA-C [facility] None     HPI 60 y.o. female  presents for 3 month follow up on hypertension, cholesterol, prediabetes, and vitamin D deficiency.   Her blood pressure has been controlled at home, today their BP is BP: 118/68 mmHg  She does not workout. She denies chest pain, shortness of breath, dizziness.  She is not on cholesterol medication and denies myalgias. Her cholesterol is at goal. The cholesterol last visit was:   Lab Results  Component Value Date   CHOL 179 12/09/2015   HDL 51 12/09/2015   LDLCALC 110 12/09/2015   TRIG 91 12/09/2015   CHOLHDL 3.5 12/09/2015    She has been working on diet and exercise for prediabetes, and denies paresthesia of the feet, polydipsia, polyuria and visual disturbances. Last A1C in the office was:  Lab Results  Component Value Date   HGBA1C 5.8* 12/09/2015   Patient is on Vitamin D supplement.   Lab Results  Component Value Date   VD25OH 22* 12/09/2015     She has psoriatic arthritis and is seeing Dr. Amil Amen.    Current Medications:  Current Outpatient Prescriptions on  File Prior to Visit  Medication Sig Dispense Refill  . acetaminophen (TYLENOL) 500 MG tablet Take by mouth every 6 (six) hours as needed.    Marland Kitchen Apremilast (OTEZLA) 30 MG TABS Take 30 mg by mouth daily.    Marland Kitchen buPROPion (WELLBUTRIN XL) 150 MG 24 hr tablet Take 1 tablet (150 mg total) by mouth every morning. 30 tablet 2   No current facility-administered medications on file prior to visit.   Medical History:  Past Medical History  Diagnosis Date  . Seasonal allergies   . Mass of chest wall 12/2012    tender to the touch  . Allergy   . Osteoarthritis    Allergies: No Known Allergies   Review of Systems:  Review of Systems  Constitutional: Positive for malaise/fatigue. Negative for fever, chills, weight loss and diaphoresis.  HENT: Negative.   Eyes: Negative.   Respiratory: Negative.   Cardiovascular: Negative.   Gastrointestinal: Negative.   Genitourinary: Negative for dysuria, urgency, frequency, hematuria and flank pain.  Musculoskeletal: Positive for myalgias, back pain and joint pain. Negative for falls and neck pain.  Skin: Positive for itching and rash.  Neurological: Negative.  Negative for weakness.  Psychiatric/Behavioral: Negative.     Family history- Review and unchanged Social history- Review and unchanged Physical Exam: BP 118/68 mmHg  Pulse 73  Temp(Src) 97.3 F (36.3 C) (Temporal)  Resp 14  Ht 5\' 4"  (1.626 m)  Wt 150 lb 9.6 oz (68.312 kg)  BMI 25.84 kg/m2  SpO2 98% Wt Readings from  Last 3 Encounters:  04/17/16 150 lb 9.6 oz (68.312 kg)  01/11/16 154 lb 3.2 oz (69.945 kg)  12/09/15 157 lb 3.2 oz (71.305 kg)   General Appearance: Well nourished, in no apparent distress. Eyes: PERRLA, EOMs, conjunctiva no swelling or erythema Sinuses: No Frontal/maxillary tenderness ENT/Mouth: Ext aud canals clear, TMs without erythema, bulging. No erythema, swelling, or exudate on post pharynx.  Tonsils not swollen or erythematous. Hearing normal.  Neck: Supple, thyroid  normal.  Respiratory: Respiratory effort normal, BS equal bilaterally without rales, rhonchi, wheezing or stridor. Cardio: RRR with no MRGs. Brisk peripheral pulses without edema.  Abdomen: Soft, + BS.  Non tender, no guarding, rebound, hernias, masses. Lymphatics: Non tender without lymphadenopathy.  Musculoskeletal: Full ROM, 5/5 strength, normal gait.  Skin: Warm, dry without rashes, lesions, ecchymosis Large scaly erythematous plaques on bilateral legs, feet, arms, and some on chest Neuro: Cranial nerves intact. Normal muscle tone, no cerebellar symptoms. Sensation intact.  Psych: Awake and oriented X 3, normal affect, Insight and Judgment appropriate.     [name], PA-C 8:52 AM [facility]

## 2016-04-17 NOTE — Patient Instructions (Signed)
[facility]  7 a.m.-6:30 p.m., Monday 7 a.m.-5 p.m., Tuesday-Friday Schedule an appointment by calling [phone].  [facility] Schedule an appointment by calling [phone].  NEEDS SCREENING MAMMOGRAM  Encourage you to get the 3D Mammogram  The 3D Mammogram is much more specific and sensitive to pick up breast cancer. For women with fibrocystic breast or lumpy breast it can be hard to determine if it is cancer or not but the 3D mammogram is able to tell this difference which cuts back on unneeded additional tests or scary call backs.   - over 40% increase in detection of breast cancer - over 40% reduction in false positives.  - fewer call backs - reduced anxiety - improved outcomes - [facility]

## 2016-05-23 ENCOUNTER — Other Ambulatory Visit: Payer: Self-pay | Admitting: Internal Medicine

## 2016-05-23 MED ORDER — CEPHALEXIN 500 MG PO CAPS: ORAL_CAPSULE | ORAL | 1 refills | Status: DC

## 2016-07-20 ENCOUNTER — Ambulatory Visit: Payer: Self-pay | Admitting: Physician Assistant

## 2016-08-17 ENCOUNTER — Ambulatory Visit (INDEPENDENT_AMBULATORY_CARE_PROVIDER_SITE_OTHER): Payer: 59 | Admitting: Physician Assistant

## 2016-08-17 ENCOUNTER — Encounter: Payer: Self-pay | Admitting: Physician Assistant

## 2016-08-17 VITALS — BP 118/76 | HR 88 | Temp 97.5°F | Resp 14 | Ht 64.0 in | Wt 156.0 lb

## 2016-08-17 DIAGNOSIS — F329 Major depressive disorder, single episode, unspecified: Secondary | ICD-10-CM

## 2016-08-17 DIAGNOSIS — E785 Hyperlipidemia, unspecified: Secondary | ICD-10-CM

## 2016-08-17 DIAGNOSIS — M542 Cervicalgia: Secondary | ICD-10-CM | POA: Diagnosis not present

## 2016-08-17 DIAGNOSIS — R7303 Prediabetes: Secondary | ICD-10-CM

## 2016-08-17 DIAGNOSIS — L4050 Arthropathic psoriasis, unspecified: Secondary | ICD-10-CM

## 2016-08-17 DIAGNOSIS — I10 Essential (primary) hypertension: Secondary | ICD-10-CM

## 2016-08-17 DIAGNOSIS — E559 Vitamin D deficiency, unspecified: Secondary | ICD-10-CM

## 2016-08-17 DIAGNOSIS — F32A Depression, unspecified: Secondary | ICD-10-CM

## 2016-08-17 DIAGNOSIS — Z79899 Other long term (current) drug therapy: Secondary | ICD-10-CM

## 2016-08-17 LAB — CBC WITH DIFFERENTIAL/PLATELET
BASOS ABS: 0 {cells}/uL (ref 0–200)
BASOS PCT: 0 %
EOS ABS: 236 {cells}/uL (ref 15–500)
Eosinophils Relative: 4 %
HEMATOCRIT: 39.4 % (ref 35.0–45.0)
HEMOGLOBIN: 13 g/dL (ref 11.7–15.5)
Lymphocytes Relative: 20 %
Lymphs Abs: 1180 cells/uL (ref 850–3900)
MCH: 30.4 pg (ref 27.0–33.0)
MCHC: 33 g/dL (ref 32.0–36.0)
MCV: 92.1 fL (ref 80.0–100.0)
MONO ABS: 472 {cells}/uL (ref 200–950)
MONOS PCT: 8 %
MPV: 11 fL (ref 7.5–12.5)
NEUTROS ABS: 4012 {cells}/uL (ref 1500–7800)
Neutrophils Relative %: 68 %
PLATELETS: 276 10*3/uL (ref 140–400)
RBC: 4.28 MIL/uL (ref 3.80–5.10)
RDW: 13.4 % (ref 11.0–15.0)
WBC: 5.9 10*3/uL (ref 3.8–10.8)

## 2016-08-17 LAB — TSH: TSH: 1.56 mIU/L

## 2016-08-17 LAB — LIPID PANEL
CHOL/HDL RATIO: 3.3 ratio (ref <5.0)
Cholesterol: 188 mg/dL (ref <200)
HDL: 57 mg/dL (ref >50)
LDL CALC: 108 mg/dL — AB (ref <100)
TRIGLYCERIDES: 116 mg/dL (ref <150)
VLDL: 23 mg/dL (ref <30)

## 2016-08-17 MED ORDER — BUPROPION HCL ER (XL) 300 MG PO TB24: 300.0000 mg | ORAL_TABLET | ORAL | 1 refills | Status: DC

## 2016-08-17 NOTE — Progress Notes (Signed)
Assessment and Plan:  Hypertension -Continue medication, monitor blood pressure at home. Continue DASH diet.  Reminder to go to the ER if any CP, SOB, nausea, dizziness, severe HA, changes vision/speech, left arm numbness and tingling and jaw pain.  Cholesterol -Continue diet and exercise. Check cholesterol.   Prediabetes  -Continue diet and exercise. Check A1C  Vitamin D Def - check level and continue medications.   Psoriatic Arthritis-  Follow up with Dr. Amil Amen Switched to stelera and is doing great  Depression Continue wellbutrin, will increase to 300mg  daily  Cervical neck pain Refer Dr. Ronnald Ramp, did previous surgery, continue tylenol.    Continue diet and meds as discussed. Further disposition pending results of labs. Over 30 minutes of exam, counseling, chart review, and critical decision making was performed Future Appointments Date Time Provider [place]  12/12/2016 9:00 AM [name], PA-C [facility] None     HPI 60 y.o. female  presents for 3 month follow up on hypertension, cholesterol, prediabetes, and vitamin D deficiency.   Her blood pressure has been controlled at home, today their BP is BP: 118/76  She does not workout. She denies chest pain, shortness of breath, dizziness.  She is not on cholesterol medication and denies myalgias. Her cholesterol is at goal. The cholesterol last visit was:   Lab Results  Component Value Date   CHOL 179 12/09/2015   HDL 51 12/09/2015   LDLCALC 110 12/09/2015   TRIG 91 12/09/2015   CHOLHDL 3.5 12/09/2015    She has been working on diet and exercise for prediabetes, and denies paresthesia of the feet, polydipsia, polyuria and visual disturbances. Last A1C in the office was:  Lab Results  Component Value Date   HGBA1C 5.8 (H) 12/09/2015   Patient is on Vitamin D supplement, she is on vitamin D 50,000 IU weekly.   Lab Results  Component Value Date   VD25OH 22 (L) 12/09/2015     She has psoriatic arthritis  and is seeing Dr. Amil Amen, switched recently to [place], doing very well. Has some HA mild, takes tylenol, and has some mild swelling in the morning.  Has had cervical neck surgery, having some neck pain pain and would like a referral to go back and see him.  She was put on wellbutrin due to fatigue/depression, molly her daughter died a year ago, worse health insurance and has stress with this, states the holidays are worse. BMI is Body mass index is 26.78 kg/m., she is working on diet and exercise. Wt Readings from Last 3 Encounters:  08/17/16 156 lb (70.8 kg)  04/17/16 150 lb 9.6 oz (68.3 kg)  01/11/16 154 lb 3.2 oz (69.9 kg)    Current Medications:  Current Outpatient Prescriptions on File Prior to Visit  Medication Sig Dispense Refill  . acetaminophen (TYLENOL) 500 MG tablet Take by mouth every 6 (six) hours as needed.    Marland Kitchen buPROPion (WELLBUTRIN XL) 150 MG 24 hr tablet Take 1 tablet (150 mg total) by mouth every morning. 90 tablet 1   No current facility-administered medications on file prior to visit.    Medical History:  Past Medical History:  Diagnosis Date  . Allergy   . Mass of chest wall 12/2012   tender to the touch  . Osteoarthritis   . Seasonal allergies    Allergies: No Known Allergies   Review of Systems:  Review of Systems  Constitutional: Positive for malaise/fatigue. Negative for chills, diaphoresis, fever and weight loss.  HENT: Negative.  Eyes: Negative.   Respiratory: Negative.   Cardiovascular: Negative.   Gastrointestinal: Negative.   Genitourinary: Negative for dysuria, flank pain, frequency, hematuria and urgency.  Musculoskeletal: Positive for neck pain. Negative for back pain, falls, joint pain and myalgias.  Skin: Positive for rash. Negative for itching.  Neurological: Negative.  Negative for weakness.  Psychiatric/Behavioral: Negative.     Family history- Review and unchanged Social history- Review and unchanged Physical Exam: BP 118/76    Pulse 88   Temp 97.5 F (36.4 C)   Resp 14   Ht 5\' 4"  (1.626 m)   Wt 156 lb (70.8 kg)   SpO2 99%   BMI 26.78 kg/m  Wt Readings from Last 3 Encounters:  08/17/16 156 lb (70.8 kg)  04/17/16 150 lb 9.6 oz (68.3 kg)  01/11/16 154 lb 3.2 oz (69.9 kg)   General Appearance: Well nourished, in no apparent distress. Eyes: PERRLA, EOMs, conjunctiva no swelling or erythema Sinuses: No Frontal/maxillary tenderness ENT/Mouth: Ext aud canals clear, TMs without erythema, bulging. No erythema, swelling, or exudate on post pharynx.  Tonsils not swollen or erythematous. Hearing normal.  Neck: Supple, thyroid normal.  Respiratory: Respiratory effort normal, BS equal bilaterally without rales, rhonchi, wheezing or stridor. Cardio: RRR with no MRGs. Brisk peripheral pulses without edema.  Abdomen: Soft, + BS.  Non tender, no guarding, rebound, hernias, masses. Lymphatics: Non tender without lymphadenopathy.  Musculoskeletal: Full ROM, 5/5 strength, normal gait.  Skin: Warm, dry without rashes, lesions, ecchymosis Large scaly erythematous plaques on bilateral legs, feet, arms, and some on chest Neuro: Cranial nerves intact. Normal muscle tone, no cerebellar symptoms. Sensation intact.  Psych: Awake and oriented X 3, normal affect, Insight and Judgment appropriate.     [name], PA-C 8:47 AM [facility]

## 2016-08-18 LAB — HEMOGLOBIN A1C
HEMOGLOBIN A1C: 5.4 % (ref <5.7)
Mean Plasma Glucose: 108 mg/dL

## 2016-08-18 LAB — VITAMIN D 25 HYDROXY (VIT D DEFICIENCY, FRACTURES): VIT D 25 HYDROXY: 36 ng/mL (ref 30–100)

## 2016-08-18 LAB — MAGNESIUM: Magnesium: 1.9 mg/dL (ref 1.5–2.5)

## 2016-10-23 ENCOUNTER — Encounter: Payer: Self-pay | Admitting: Physician Assistant

## 2016-11-08 DIAGNOSIS — L4050 Arthropathic psoriasis, unspecified: Secondary | ICD-10-CM | POA: Diagnosis not present

## 2016-11-08 DIAGNOSIS — M150 Primary generalized (osteo)arthritis: Secondary | ICD-10-CM | POA: Diagnosis not present

## 2016-11-08 DIAGNOSIS — E559 Vitamin D deficiency, unspecified: Secondary | ICD-10-CM | POA: Diagnosis not present

## 2016-12-08 ENCOUNTER — Other Ambulatory Visit: Payer: Self-pay

## 2016-12-08 DIAGNOSIS — F329 Major depressive disorder, single episode, unspecified: Secondary | ICD-10-CM

## 2016-12-08 DIAGNOSIS — F32A Depression, unspecified: Secondary | ICD-10-CM

## 2016-12-08 MED ORDER — BUPROPION HCL ER (XL) 300 MG PO TB24: 300.0000 mg | ORAL_TABLET | ORAL | 1 refills | Status: DC

## 2016-12-12 ENCOUNTER — Encounter: Payer: Self-pay | Admitting: Physician Assistant

## 2017-01-05 ENCOUNTER — Encounter: Payer: Self-pay | Admitting: Physician Assistant

## 2017-01-05 ENCOUNTER — Ambulatory Visit (INDEPENDENT_AMBULATORY_CARE_PROVIDER_SITE_OTHER): Payer: 59 | Admitting: Physician Assistant

## 2017-01-05 VITALS — BP 118/68 | HR 76 | Temp 97.8°F | Ht 63.5 in | Wt 161.0 lb

## 2017-01-05 DIAGNOSIS — Z1159 Encounter for screening for other viral diseases: Secondary | ICD-10-CM

## 2017-01-05 DIAGNOSIS — E538 Deficiency of other specified B group vitamins: Secondary | ICD-10-CM

## 2017-01-05 DIAGNOSIS — L4050 Arthropathic psoriasis, unspecified: Secondary | ICD-10-CM | POA: Diagnosis not present

## 2017-01-05 DIAGNOSIS — R6889 Other general symptoms and signs: Secondary | ICD-10-CM

## 2017-01-05 DIAGNOSIS — E785 Hyperlipidemia, unspecified: Secondary | ICD-10-CM

## 2017-01-05 DIAGNOSIS — M4692 Unspecified inflammatory spondylopathy, cervical region: Secondary | ICD-10-CM | POA: Diagnosis not present

## 2017-01-05 DIAGNOSIS — E663 Overweight: Secondary | ICD-10-CM

## 2017-01-05 DIAGNOSIS — I10 Essential (primary) hypertension: Secondary | ICD-10-CM

## 2017-01-05 DIAGNOSIS — Z136 Encounter for screening for cardiovascular disorders: Secondary | ICD-10-CM

## 2017-01-05 DIAGNOSIS — F3341 Major depressive disorder, recurrent, in partial remission: Secondary | ICD-10-CM

## 2017-01-05 DIAGNOSIS — M1990 Unspecified osteoarthritis, unspecified site: Secondary | ICD-10-CM

## 2017-01-05 DIAGNOSIS — E559 Vitamin D deficiency, unspecified: Secondary | ICD-10-CM

## 2017-01-05 DIAGNOSIS — Z79899 Other long term (current) drug therapy: Secondary | ICD-10-CM | POA: Diagnosis not present

## 2017-01-05 DIAGNOSIS — Z0001 Encounter for general adult medical examination with abnormal findings: Secondary | ICD-10-CM

## 2017-01-05 DIAGNOSIS — K649 Unspecified hemorrhoids: Secondary | ICD-10-CM

## 2017-01-05 DIAGNOSIS — E041 Nontoxic single thyroid nodule: Secondary | ICD-10-CM

## 2017-01-05 DIAGNOSIS — M47812 Spondylosis without myelopathy or radiculopathy, cervical region: Secondary | ICD-10-CM | POA: Insufficient documentation

## 2017-01-05 HISTORY — DX: Major depressive disorder, recurrent, in partial remission: F33.41

## 2017-01-05 HISTORY — DX: Essential (primary) hypertension: I10

## 2017-01-05 LAB — MAGNESIUM: MAGNESIUM: 2 mg/dL (ref 1.5–2.5)

## 2017-01-05 LAB — CBC WITH DIFFERENTIAL/PLATELET
BASOS PCT: 1 %
Basophils Absolute: 64 cells/uL (ref 0–200)
EOS PCT: 2 %
Eosinophils Absolute: 128 cells/uL (ref 15–500)
HCT: 40.1 % (ref 35.0–45.0)
Hemoglobin: 13.3 g/dL (ref 11.7–15.5)
Lymphocytes Relative: 18 %
Lymphs Abs: 1152 cells/uL (ref 850–3900)
MCH: 30.2 pg (ref 27.0–33.0)
MCHC: 33.2 g/dL (ref 32.0–36.0)
MCV: 91.1 fL (ref 80.0–100.0)
MONOS PCT: 6 %
MPV: 10.8 fL (ref 7.5–12.5)
Monocytes Absolute: 384 cells/uL (ref 200–950)
NEUTROS ABS: 4672 {cells}/uL (ref 1500–7800)
Neutrophils Relative %: 73 %
PLATELETS: 271 10*3/uL (ref 140–400)
RBC: 4.4 MIL/uL (ref 3.80–5.10)
RDW: 13.2 % (ref 11.0–15.0)
WBC: 6.4 10*3/uL (ref 3.8–10.8)

## 2017-01-05 LAB — LIPID PANEL
CHOL/HDL RATIO: 3.6 ratio (ref <5.0)
CHOLESTEROL: 178 mg/dL (ref <200)
HDL: 50 mg/dL — AB (ref >50)
LDL Cholesterol: 111 mg/dL — ABNORMAL HIGH (ref <100)
Triglycerides: 83 mg/dL (ref <150)
VLDL: 17 mg/dL (ref <30)

## 2017-01-05 LAB — BASIC METABOLIC PANEL WITH GFR
BUN: 14 mg/dL (ref 7–25)
CHLORIDE: 107 mmol/L (ref 98–110)
CO2: 25 mmol/L (ref 20–31)
CREATININE: 0.85 mg/dL (ref 0.50–0.99)
Calcium: 9.3 mg/dL (ref 8.6–10.4)
GFR, Est African American: 85 mL/min (ref >=60)
GFR, Est Non African American: 74 mL/min (ref >=60)
Glucose, Bld: 102 mg/dL — ABNORMAL HIGH (ref 65–99)
POTASSIUM: 3.9 mmol/L (ref 3.5–5.3)
SODIUM: 140 mmol/L (ref 135–146)

## 2017-01-05 LAB — TSH: TSH: 1.27 m[IU]/L

## 2017-01-05 LAB — HEPATIC FUNCTION PANEL
ALK PHOS: 54 U/L (ref 33–130)
ALT: 15 U/L (ref 6–29)
AST: 15 U/L (ref 10–35)
Albumin: 4.1 g/dL (ref 3.6–5.1)
BILIRUBIN DIRECT: 0.1 mg/dL (ref <=0.2)
BILIRUBIN TOTAL: 0.4 mg/dL (ref 0.2–1.2)
Indirect Bilirubin: 0.3 mg/dL (ref 0.2–1.2)
Total Protein: 6.5 g/dL (ref 6.1–8.1)

## 2017-01-05 LAB — IRON AND TIBC
%SAT: 20 % (ref 11–50)
Iron: 73 ug/dL (ref 45–160)
TIBC: 371 ug/dL (ref 250–450)
UIBC: 298 ug/dL (ref 125–400)

## 2017-01-05 MED ORDER — VITAMIN D (ERGOCALCIFEROL) 1.25 MG (50000 UNIT) PO CAPS: ORAL_CAPSULE | ORAL | 1 refills | Status: DC

## 2017-01-05 MED ORDER — PHENTERMINE HCL 37.5 MG PO TABS: 37.5000 mg | ORAL_TABLET | Freq: Every day | ORAL | 2 refills | Status: DC

## 2017-01-05 MED ORDER — HYDROCORTISONE 2.5 % RE CREA: 1.0000 "application " | TOPICAL_CREAM | Freq: Two times a day (BID) | RECTAL | 0 refills | Status: DC

## 2017-01-05 NOTE — Patient Instructions (Signed)
[facility]  7 a.m.-6:30 p.m., Monday 7 a.m.-5 p.m., Tuesday-Friday Schedule an appointment by calling [phone].  Encourage you to get the 3D Mammogram  The 3D Mammogram is much more specific and sensitive to pick up breast cancer. For women with fibrocystic breast or lumpy breast it can be hard to determine if it is cancer or not but the 3D mammogram is able to tell this difference which cuts back on unneeded additional tests or scary call backs.   - over 40% increase in detection of breast cancer - over 40% reduction in false positives.  - fewer call backs - reduced anxiety - improved outcomes - [facility]  About Hemorrhoids  Hemorrhoids are swollen veins in the lower rectum and anus.  Also called piles, hemorrhoids are a common problem.  Hemorrhoids may be internal (inside the rectum) or external (around the anus).  Internal Hemorrhoids  Internal hemorrhoids are often painless, but they rarely cause bleeding.  The internal veins may stretch and fall down (prolapse) through the anus to the outside of the body.  The veins may then become irritated and painful.  External Hemorrhoids  External hemorrhoids can be easily seen or felt around the anal opening.  They are under the skin around the anus.  When the swollen veins are scratched or broken by straining, rubbing or wiping they sometimes bleed.  How Hemorrhoids Occur  Veins in the rectum and around the anus tend to swell under pressure.  Hemorrhoids can result from increased pressure in the veins of your anus or rectum.  Some sources of pressure are:   Straining to have a bowel movement because of constipation  Waiting too long to have a bowel movement  Coughing and sneezing often  Sitting for extended periods of time, including on the toilet  Diarrhea  Obesity  Trauma or injury to the anus  Some liver diseases  Stress  Family history of hemorrhoids  Pregnancy  Pregnant  women should try to avoid becoming constipated, because they are more likely to have hemorrhoids during pregnancy.  In the last trimester of pregnancy, the enlarged uterus may press on blood vessels and causes hemorrhoids.  In addition, the strain of childbirth sometimes causes hemorrhoids after the birth.  Symptoms of Hemorrhoids  Some symptoms of hemorrhoids include:  Swelling and/or a tender lump around the anus  Itching, mild burning and bleeding around the anus  Painful bowel movements with or without constipation  Bright red blood covering the stool, on toilet paper or in the toilet bowel.   Symptoms usually go away within a few days.  Always talk to your doctor about any bleeding to make sure it is not from some other causes.  Diagnosing and Treating Hemorrhoids  Diagnosis is made by an examination by your healthcare provider.  Special test can be performed by your doctor.    Most cases of hemorrhoids can be treated with:  High-fiber diet: Eat more high-fiber foods, which help prevent constipation.  Ask for more detailed fiber information on types and sources of fiber from your healthcare provider.  Fluids: Drink plenty of water.  This helps soften bowel movements so they are easier to pass.  Sitz baths and cold packs: Sitting in lukewarm water two or three times a day for 15 minutes cleases the anal area and may relieve discomfort.  If the water is too hot, swelling around the anus will get worse.  Placing a cloth-covered ice pack on the  anus for ten minutes four times a day can also help reduce selling.  Gently pushing a prolapsed hemorrhoid back inside after the bath or ice pack can be helpful.  Medications: For mild discomfort, your healthcare provider may suggest over-the-counter pain medication or prescribe a cream or ointment for topical use.  The cream may contain witch hazel, zinc oxide or petroleum jelly.  Medicated suppositories are also a treatment option.  Always  consult your doctor before applying medications or creams.  Procedures and surgeries: There are also a number of procedures and surgeries to shrink or remove hemorrhoids in more serious cases.  Talk to your physician about these options.  You can often prevent hemorrhoids or keep them from becoming worse by maintaining a healthy lifestyle.  Eat a fiber-rich diet of fruits, vegetables and whole grains.  Also, drink plenty of water and exercise regularly.   2007, [employer] Doc.30  Phentermine  While taking the medication we may ask that you come into the office once a month or once every 2-3 months to monitor your weight, blood pressure, and heart rate. In addition we can help answer your questions about diet, exercise, and help you every step of the way with your weight loss journey. Sometime it is helpful if you bring in a food diary or use an app on your phone such as myfitnesspal to record your calorie intake, especially in the beginning.   You can start out on 1/3 to 1/2 a pill in the morning and if you are tolerating it well you can increase to one pill daily. I also have some patients that take 1/3 or 1/2 at lunch to help prevent night time eating.  This medication is cheapest CASH pay at [facility] is 14-17 dollars and you do NOT need a membership to get meds from there.    What is this medicine? PHENTERMINE (FEN ter meen) decreases your appetite. This medicine is intended to be used in addition to a healthy reduced calorie diet and exercise. The best results are achieved this way. This medicine is only indicated for short-term use. Eventually your weight loss may level out and the medication will no longer be needed.   How should I use this medicine? Take this medicine by mouth. Follow the directions on the prescription label. The tablets should stay in the bottle until immediately before you take your dose. Take your doses at regular intervals. Do not  take your medicine more often than directed.  Overdosage: If you think you have taken too much of this medicine contact a poison control center or emergency room at once. NOTE: This medicine is only for you. Do not share this medicine with others.  What if I miss a dose? If you miss a dose, take it as soon as you can. If it is almost time for your next dose, take only that dose. Do not take double or extra doses. Do not increase or in any way change your dose without consulting your doctor.  What should I watch for while using this medicine? Notify your physician immediately if you become short of breath while doing your normal activities. Do not take this medicine within 6 hours of bedtime. It can keep you from getting to sleep. Avoid drinks that contain caffeine and try to stick to a regular bedtime every night. Do not stand or sit up quickly, especially if you are an older patient. This reduces the risk of dizzy or fainting  spells. Avoid alcoholic drinks.  What side effects may I notice from receiving this medicine? Side effects that you should report to your doctor or health care professional as soon as possible: -chest pain, palpitations -depression or severe changes in mood -increased blood pressure -irritability -nervousness or restlessness -severe dizziness -shortness of breath -problems urinating -unusual swelling of the legs -vomiting  Side effects that usually do not require medical attention (report to your doctor or health care professional if they continue or are bothersome): -blurred vision or other eye problems -changes in sexual ability or desire -constipation or diarrhea -difficulty sleeping -dry mouth or unpleasant taste -headache -nausea This list may not describe all possible side effects. Call your doctor for medical advice about side effects. You may report side effects to FDA at 1-800-FDA-1088.   Vitamin D goal is between 60-80  Please make sure that you  are taking your Vitamin D as directed.   It is very important as a natural anti-inflammatory   helping hair, skin, and nails, as well as reducing stroke and heart attack risk.   It helps your bones and helps with mood.  We want you on at least 5000 IU daily  It also decreases numerous cancer risks so please take it as directed.   Low Vit D is associated with a 200-300% higher risk for CANCER   and 200-300% higher risk for HEART   ATTACK  &  STROKE.    .....................................Marland Kitchen  It is also associated with higher death rate at younger ages,   autoimmune diseases like Rheumatoid arthritis, Lupus, Multiple Sclerosis.     Also many other serious conditions, like depression, Alzheimer's  Dementia, infertility, muscle aches, fatigue, fibromyalgia - just to name a few.  +++++++++++++++++++  Can get liquid vitamin D from [facility] here in [place] at  [facility] alternatives [address] [place], [place] [address] Or you can try earth fare

## 2017-01-05 NOTE — Progress Notes (Signed)
Complete Physical  Assessment and Plan  Encounter for general adult medical examination with abnormal findings Needs MGM  Hyperlipidemia, unspecified hyperlipidemia type -continue medications, check lipids, decrease fatty foods, increase activity.  -     Lipid panel  Essential hypertension - continue medications, DASH diet, exercise and monitor at home. Call if greater than 130/80.  -     CBC with Differential/Platelet -     BASIC METABOLIC PANEL WITH GFR -     Hepatic function panel -     TSH -     Urinalysis, Routine w reflex microscopic -     Microalbumin / creatinine urine ratio -     EKG 12-Lead  Psoriatic arthritis ([place]) On DMARD,continue follow up Dr. Amil Amen  Vitamin D deficiency -     VITAMIN D 25 Hydroxy (Vit-D Deficiency, Fractures) -     Vitamin D, Ergocalciferol, (DRISDOL) 50000 units CAPS capsule; 1 pill 3 days a week for vitamin d deficiency  Medication management -     Magnesium  Recurrent major depressive disorder, in partial remission ([place]) - continue medications, stress management techniques discussed, increase water, good sleep hygiene discussed, increase exercise, and increase veggies.   Cervical arthritis ([place]) Continue follow up ortho  B12 deficiency -     Iron and TIBC -     Ferritin -     Vitamin B12  Hemorrhoids, unspecified hemorrhoid type Follow up GI -     hydrocortisone (ANUSOL-HC) 2.5 % rectal cream; Place 1 application rectally 2 (two) times daily.  Overweight (BMI 25.0-29.9) -     phentermine (ADIPEX-P) 37.5 MG tablet; Take 1 tablet (37.5 mg total) by mouth daily before breakfast.  Screening for viral disease -     HIV antibody -     Hepatitis C antibody  Left thyroid nodule Will monitor  Osteoarthritis, unspecified osteoarthritis type, unspecified site Continue follow up ortho  Discussed med's effects and SE's. Screening labs and tests as requested with regular follow-up as recommended. Over 40 minutes of exam, counseling,  chart review, and complex, high level critical decision making was performed this visit.  Future Appointments Date Time Provider [place]  04/13/2017 8:45 AM [name], PA-C [facility] None  01/07/2018 9:00 AM [name], PA-C [facility] None    HPI  61 y.o. female  presents for a complete physical.  She has psoriatic arthritis and is seeing Dr. Amil Amen, she is on stelera and doing very well. Following with ortho for osteoarthritis.  Her blood pressure has been controlled at home, today their BP is BP: 118/68  She does not workout. She denies chest pain, shortness of breath, dizziness.  She is not on cholesterol medication and denies myalgias. Her cholesterol is at goal. The cholesterol last visit was:   Lab Results  Component Value Date   CHOL 188 08/17/2016   HDL 57 08/17/2016   LDLCALC 108 (H) 08/17/2016   TRIG 116 08/17/2016   CHOLHDL 3.3 08/17/2016   She has been working on diet and exercise for prediabetes, she is not on bASA, she is not on ACE/ARB and denies paresthesia of the feet, polydipsia, polyuria and visual disturbances. Last A1C in the office was:  Lab Results  Component Value Date   HGBA1C 5.4 08/17/2016  Last GFR:  Lab Results  Component Value Date   GFRNONAA 86 12/09/2015  Patient is on Vitamin D supplement.   Lab Results  Component Value Date   VD25OH 14 08/17/2016     Daughter, [place], passed  2 years ago in June from pneumonia, was suppose to be getting married,  mom is 16 with dementia. Has been stressed due to tax season, still greifing but doing much better.   BMI is Body mass index is 28.07 kg/m., she is working on diet and exercise. Wt Readings from Last 3 Encounters:  01/05/17 161 lb (73 kg)  08/17/16 156 lb (70.8 kg)  04/17/16 150 lb 9.6 oz (68.3 kg)    Current Medications:  Current Outpatient Prescriptions on File Prior to Visit  Medication Sig Dispense Refill  . acetaminophen (TYLENOL) 500 MG tablet Take by mouth every 6 (six)  hours as needed.    Marland Kitchen buPROPion (WELLBUTRIN XL) 300 MG 24 hr tablet Take 1 tablet (300 mg total) by mouth every morning. 90 tablet 1   No current facility-administered medications on file prior to visit.    Health Maintenance: Immunization History  Administered Date(s) Administered  . Tdap 12/09/2015   Tetanus: 2017 Pneumovax: N/A Prevnar 13: N/A Flu vaccine: declines Zostavax: will check with Dr. Amil Amen  LMP: 20+ years s/p TAH Pap: 2017 MGM: 12/2012 DUE DEXA: never Colonoscopy: 2014  Patient Care Team: Unk Pinto, MD as PCP - General (Internal Medicine) Hennie Duos, MD as Consulting Physician (Rheumatology)  Medical History:  Past Medical History:  Diagnosis Date  . Allergy   . Mass of chest wall 12/2012   tender to the touch  . Osteoarthritis   . Seasonal allergies    Allergies No Known Allergies  SURGICAL HISTORY She  has a past surgical history that includes Cholecystectomy; Shoulder surgery (03/2012); Cervical fusion (2005); Anterior cervical decomp/discectomy fusion (08/08/2012); Total shoulder arthroplasty (09/24/2012); Eye surgery; Lumbar laminectomy/decompression microdiscectomy (01/01/2013); Lumbar fusion (01/01/2013); Abdominal hysterectomy (1997); Mass excision (N/A, 01/29/2013); and Hemorrhoid surgery (N/A, 09/02/2013). FAMILY HISTORY Her family history includes Pancreatitis in her father. SOCIAL HISTORY She  reports that she has never smoked. She has never used smokeless tobacco. She reports that she does not drink alcohol or use drugs.   Review of Systems: Review of Systems  Constitutional: Positive for malaise/fatigue. Negative for chills, diaphoresis, fever and weight loss.  Eyes: Negative.   Respiratory: Negative.   Cardiovascular: Negative for chest pain, palpitations, orthopnea, claudication, leg swelling and PND.  Gastrointestinal: Negative.   Genitourinary: Negative.        + incontinence  Musculoskeletal: Positive for back pain and neck  pain. Negative for falls, joint pain and myalgias.  Skin: Negative.   Neurological: Negative.  Negative for dizziness and weakness.  Psychiatric/Behavioral: Negative for depression (improved with wellbutrin), hallucinations, memory loss, substance abuse and suicidal ideas. The patient is not nervous/anxious and does not have insomnia.     Physical Exam: Estimated body mass index is 28.07 kg/m as calculated from the following:   Height as of this encounter: 5' 3.5" (1.613 m).   Weight as of this encounter: 161 lb (73 kg). BP 118/68   Pulse 76   Temp 97.8 F (36.6 C) (Temporal)   Ht 5' 3.5" (1.613 m)   Wt 161 lb (73 kg)   BMI 28.07 kg/m  General Appearance: Well nourished, in no apparent distress.  Eyes: PERRLA, EOMs, conjunctiva no swelling or erythema, normal fundi and vessels.  Sinuses: No Frontal/maxillary tenderness  ENT/Mouth: Ext aud canals clear, normal light reflex with TMs without erythema, bulging. Good dentition. No erythema, swelling, or exudate on post pharynx. Tonsils not swollen or erythematous. Hearing normal.  Neck: Supple, thyroid normal. No bruits  Respiratory: Respiratory effort  normal, BS equal bilaterally without rales, rhonchi, wheezing or stridor.  Cardio: RRR without murmurs, rubs or gallops. Brisk peripheral pulses without edema.  Chest: symmetric, with normal excursions and percussion.  Breasts: Symmetric, well healing reconstructive scars, without nipple discharge, retractions.  Abdomen: Soft, nontender, no guarding, rebound, hernias, masses, or organomegaly.  Lymphatics: Non tender without lymphadenopathy.  Genitourinary: defer Musculoskeletal: Full ROM all peripheral extremities,5/5 strength, and normal gait.  Skin: Warm, dry without rashes, lesions, ecchymosis. Neuro: Cranial nerves intact, reflexes equal bilaterally. Normal muscle tone, no cerebellar symptoms. Sensation intact.  Psych: Awake and oriented X 3, normal affect, Insight and Judgment  appropriate.   EKG: WNL no ST changes. AORTA SCAN: defer  [name] 9:26 AM [facility]

## 2017-01-06 LAB — URINALYSIS, ROUTINE W REFLEX MICROSCOPIC
Bilirubin Urine: NEGATIVE
GLUCOSE, UA: NEGATIVE
HGB URINE DIPSTICK: NEGATIVE
Ketones, ur: NEGATIVE
LEUKOCYTES UA: NEGATIVE
NITRITE: NEGATIVE
PH: 5.5 (ref 5.0–8.0)
Protein, ur: NEGATIVE
Specific Gravity, Urine: 1.021 (ref 1.001–1.035)

## 2017-01-06 LAB — FERRITIN: Ferritin: 32 ng/mL (ref 20–288)

## 2017-01-06 LAB — MICROALBUMIN / CREATININE URINE RATIO
Creatinine, Urine: 139 mg/dL (ref 20–320)
MICROALB UR: 0.5 mg/dL
Microalb Creat Ratio: 4 mcg/mg creat (ref <30)

## 2017-01-06 LAB — URINALYSIS, MICROSCOPIC ONLY
BACTERIA UA: NONE SEEN [HPF]
CASTS: NONE SEEN [LPF]
Crystals: NONE SEEN [HPF]
RBC / HPF: NONE SEEN RBC/HPF (ref <=2)
SQUAMOUS EPITHELIAL / LPF: NONE SEEN [HPF] (ref <=5)
WBC, UA: NONE SEEN WBC/HPF (ref <=5)
YEAST: NONE SEEN [HPF]

## 2017-01-06 LAB — HIV ANTIBODY (ROUTINE TESTING W REFLEX): HIV: NONREACTIVE

## 2017-01-06 LAB — HEPATITIS C ANTIBODY: HCV Ab: NEGATIVE

## 2017-01-06 LAB — VITAMIN B12: Vitamin B-12: 223 pg/mL (ref 200–1100)

## 2017-01-06 LAB — VITAMIN D 25 HYDROXY (VIT D DEFICIENCY, FRACTURES): Vit D, 25-Hydroxy: 44 ng/mL (ref 30–100)

## 2017-01-18 ENCOUNTER — Other Ambulatory Visit: Payer: Self-pay

## 2017-01-18 DIAGNOSIS — F32A Depression, unspecified: Secondary | ICD-10-CM

## 2017-01-18 DIAGNOSIS — F329 Major depressive disorder, single episode, unspecified: Secondary | ICD-10-CM

## 2017-01-18 MED ORDER — BUPROPION HCL ER (XL) 300 MG PO TB24: 300.0000 mg | ORAL_TABLET | ORAL | 1 refills | Status: DC

## 2017-02-19 DIAGNOSIS — E559 Vitamin D deficiency, unspecified: Secondary | ICD-10-CM | POA: Diagnosis not present

## 2017-02-19 DIAGNOSIS — L4050 Arthropathic psoriasis, unspecified: Secondary | ICD-10-CM | POA: Diagnosis not present

## 2017-02-19 DIAGNOSIS — M150 Primary generalized (osteo)arthritis: Secondary | ICD-10-CM | POA: Diagnosis not present

## 2017-04-02 DIAGNOSIS — L400 Psoriasis vulgaris: Secondary | ICD-10-CM | POA: Diagnosis not present

## 2017-04-11 NOTE — Progress Notes (Deleted)
Assessment and Plan:  Hypertension -Continue medication, monitor blood pressure at home. Continue DASH diet.  Reminder to go to the ER if any CP, SOB, nausea, dizziness, severe HA, changes vision/speech, left arm numbness and tingling and jaw pain.  Cholesterol -Continue diet and exercise. Check cholesterol.   Prediabetes  -Continue diet and exercise. Check A1C  Vitamin D Def - check level and continue medications.   Psoriatic Arthritis-  Follow up with Dr. Amil Amen Switched to stelera and is doing great  Depression Continue wellbutrin, will increase to 300mg  daily  Cervical neck pain Refer Dr. Ronnald Ramp, did previous surgery, continue tylenol.    Continue diet and meds as discussed. Further disposition pending results of labs. Over 30 minutes of exam, counseling, chart review, and critical decision making was performed Future Appointments Date Time Provider [place]  04/13/2017 8:45 AM [name], PA-C [facility] None  01/07/2018 9:00 AM [name], PA-C [facility] None     HPI 61 y.o. female  presents for 3 month follow up on hypertension, cholesterol, prediabetes, and vitamin D deficiency.   Her blood pressure has been controlled at home, today their BP is    She does not workout. She denies chest pain, shortness of breath, dizziness.  She is not on cholesterol medication and denies myalgias. Her cholesterol is at goal. The cholesterol last visit was:   Lab Results  Component Value Date   CHOL 178 01/05/2017   HDL 50 (L) 01/05/2017   LDLCALC 111 (H) 01/05/2017   TRIG 83 01/05/2017   CHOLHDL 3.6 01/05/2017    She has been working on diet and exercise for prediabetes, and denies paresthesia of the feet, polydipsia, polyuria and visual disturbances. Last A1C in the office was:  Lab Results  Component Value Date   HGBA1C 5.4 08/17/2016   Patient is on Vitamin D supplement, she is on vitamin D 50,000 IU weekly.   Lab Results  Component Value Date   VD25OH 44 01/05/2017     She has psoriatic arthritis and is seeing Dr. Amil Amen, switched recently to [place], doing very well. Has some HA mild, takes tylenol, and has some mild swelling in the morning.  Has had cervical neck surgery, having some neck pain pain and would like a referral to go back and see him.  She was put on wellbutrin due to fatigue/depression, molly her daughter died a year ago, worse health insurance and has stress with this, states the holidays are worse. BMI is There is no height or weight on file to calculate BMI., she is working on diet and exercise. Wt Readings from Last 3 Encounters:  01/05/17 161 lb (73 kg)  08/17/16 156 lb (70.8 kg)  04/17/16 150 lb 9.6 oz (68.3 kg)    Current Medications:  Current Outpatient Prescriptions on File Prior to Visit  Medication Sig Dispense Refill  . acetaminophen (TYLENOL) 500 MG tablet Take by mouth every 6 (six) hours as needed.    Marland Kitchen buPROPion (WELLBUTRIN XL) 300 MG 24 hr tablet Take 1 tablet (300 mg total) by mouth every morning. 90 tablet 1  . hydrocortisone (ANUSOL-HC) 2.5 % rectal cream Place 1 application rectally 2 (two) times daily. 30 g 0  . phentermine (ADIPEX-P) 37.5 MG tablet Take 1 tablet (37.5 mg total) by mouth daily before breakfast. 30 tablet 2  . ustekinumab (STELARA) 45 MG/0.5ML SOSY injection Inject 45 mg into the skin. Injects once every quarter    . Vitamin D, Ergocalciferol, (DRISDOL) 50000 units CAPS capsule 1  pill 3 days a week for vitamin d deficiency 36 capsule 1   No current facility-administered medications on file prior to visit.    Medical History:  Past Medical History:  Diagnosis Date  . Allergy   . Mass of chest wall 12/2012   tender to the touch  . Osteoarthritis   . Seasonal allergies    Allergies: No Known Allergies   Review of Systems:  Review of Systems  Constitutional: Positive for malaise/fatigue. Negative for chills, diaphoresis, fever and weight loss.  HENT: Negative.   Eyes:  Negative.   Respiratory: Negative.   Cardiovascular: Negative.   Gastrointestinal: Negative.   Genitourinary: Negative for dysuria, flank pain, frequency, hematuria and urgency.  Musculoskeletal: Positive for neck pain. Negative for back pain, falls, joint pain and myalgias.  Skin: Positive for rash. Negative for itching.  Neurological: Negative.  Negative for weakness.  Psychiatric/Behavioral: Negative.     Family history- Review and unchanged Social history- Review and unchanged Physical Exam: There were no vitals taken for this visit. Wt Readings from Last 3 Encounters:  01/05/17 161 lb (73 kg)  08/17/16 156 lb (70.8 kg)  04/17/16 150 lb 9.6 oz (68.3 kg)   General Appearance: Well nourished, in no apparent distress. Eyes: PERRLA, EOMs, conjunctiva no swelling or erythema Sinuses: No Frontal/maxillary tenderness ENT/Mouth: Ext aud canals clear, TMs without erythema, bulging. No erythema, swelling, or exudate on post pharynx.  Tonsils not swollen or erythematous. Hearing normal.  Neck: Supple, thyroid normal.  Respiratory: Respiratory effort normal, BS equal bilaterally without rales, rhonchi, wheezing or stridor. Cardio: RRR with no MRGs. Brisk peripheral pulses without edema.  Abdomen: Soft, + BS.  Non tender, no guarding, rebound, hernias, masses. Lymphatics: Non tender without lymphadenopathy.  Musculoskeletal: Full ROM, 5/5 strength, normal gait.  Skin: Warm, dry without rashes, lesions, ecchymosis Large scaly erythematous plaques on bilateral legs, feet, arms, and some on chest Neuro: Cranial nerves intact. Normal muscle tone, no cerebellar symptoms. Sensation intact.  Psych: Awake and oriented X 3, normal affect, Insight and Judgment appropriate.     [name], PA-C 1:34 PM [facility]

## 2017-04-13 ENCOUNTER — Ambulatory Visit: Payer: Self-pay | Admitting: Physician Assistant

## 2017-05-04 NOTE — Progress Notes (Signed)
Assessment and Plan:  Hypertension -Continue medication, monitor blood pressure at home. Continue DASH diet.  Reminder to go to the ER if any CP, SOB, nausea, dizziness, severe HA, changes vision/speech, left arm numbness and tingling and jaw pain.  Cholesterol -Continue diet and exercise. Check cholesterol.   Prediabetes  -Continue diet and exercise. Check A1C  Vitamin D Def - check level and continue medications.   Psoriatic Arthritis-  Follow up with Dr. Amil Amen Had TB test today Follows with Dr. Allyson Sabal as well.  Depression in remission Continue wellbutrin   Continue diet and meds as discussed. Further disposition pending results of labs. Over 30 minutes of exam, counseling, chart review, and critical decision making was performed Future Appointments Date Time Provider [place]  01/07/2018 9:00 AM [name], PA-C [facility] None     HPI 61 y.o. female  presents for 3 month follow up on hypertension, cholesterol, prediabetes, and vitamin D deficiency.   Her blood pressure has been controlled at home, today their BP is BP: 128/84  She does not workout. She denies chest pain, shortness of breath, dizziness.  She is not on cholesterol medication and denies myalgias. Her cholesterol is at goal. The cholesterol last visit was:   Lab Results  Component Value Date   CHOL 178 01/05/2017   HDL 50 (L) 01/05/2017   LDLCALC 111 (H) 01/05/2017   TRIG 83 01/05/2017   CHOLHDL 3.6 01/05/2017    She has been working on diet and exercise for prediabetes, and denies paresthesia of the feet, polydipsia, polyuria and visual disturbances. Last A1C in the office was:  Lab Results  Component Value Date   HGBA1C 5.4 08/17/2016   Patient is on Vitamin D supplement, she is on vitamin D 50,000 IU weekly.   Lab Results  Component Value Date   VD25OH 44 01/05/2017     She has psoriatic arthritis and is seeing Dr. Amil Amen. She was put on wellbutrin due to fatigue/depression and  going well.  BMI is Body mass index is 26.85 kg/m., she is working on diet and exercise. She is on phentermine.  Wt Readings from Last 3 Encounters:  05/07/17 154 lb (69.9 kg)  01/05/17 161 lb (73 kg)  08/17/16 156 lb (70.8 kg)    Current Medications:  Current Outpatient Prescriptions on File Prior to Visit  Medication Sig Dispense Refill  . acetaminophen (TYLENOL) 500 MG tablet Take by mouth every 6 (six) hours as needed.    Marland Kitchen buPROPion (WELLBUTRIN XL) 300 MG 24 hr tablet Take 1 tablet (300 mg total) by mouth every morning. 90 tablet 1  . hydrocortisone (ANUSOL-HC) 2.5 % rectal cream Place 1 application rectally 2 (two) times daily. 30 g 0  . phentermine (ADIPEX-P) 37.5 MG tablet Take 1 tablet (37.5 mg total) by mouth daily before breakfast. 30 tablet 2  . ustekinumab (STELARA) 45 MG/0.5ML SOSY injection Inject 45 mg into the skin. Injects once every quarter    . Vitamin D, Ergocalciferol, (DRISDOL) 50000 units CAPS capsule 1 pill 3 days a week for vitamin d deficiency 36 capsule 1   No current facility-administered medications on file prior to visit.    Medical History:  Past Medical History:  Diagnosis Date  . Allergy   . Mass of chest wall 12/2012   tender to the touch  . Osteoarthritis   . Seasonal allergies    Allergies: No Known Allergies   Review of Systems:  Review of Systems  Constitutional: Positive for malaise/fatigue. Negative for  chills, diaphoresis, fever and weight loss.  HENT: Negative.   Eyes: Negative.   Respiratory: Negative.   Cardiovascular: Negative.   Gastrointestinal: Negative.   Genitourinary: Negative for dysuria, flank pain, frequency, hematuria and urgency.  Musculoskeletal: Negative for back pain, falls, joint pain, myalgias and neck pain.  Skin: Positive for rash. Negative for itching.  Neurological: Negative.  Negative for weakness.  Psychiatric/Behavioral: Negative.     Family history- Review and unchanged Social history- Review and  unchanged Physical Exam: BP 128/84   Pulse 63   Temp (!) 97.5 F (36.4 C)   Resp 14   Ht 5' 3.5" (1.613 m)   Wt 154 lb (69.9 kg)   SpO2 99%   BMI 26.85 kg/m  Wt Readings from Last 3 Encounters:  05/07/17 154 lb (69.9 kg)  01/05/17 161 lb (73 kg)  08/17/16 156 lb (70.8 kg)   General Appearance: Well nourished, in no apparent distress. Eyes: PERRLA, EOMs, conjunctiva no swelling or erythema Sinuses: No Frontal/maxillary tenderness ENT/Mouth: Ext aud canals clear, TMs without erythema, bulging. No erythema, swelling, or exudate on post pharynx.  Tonsils not swollen or erythematous. Hearing normal.  Neck: Supple, thyroid normal.  Respiratory: Respiratory effort normal, BS equal bilaterally without rales, rhonchi, wheezing or stridor. Cardio: RRR with no MRGs. Brisk peripheral pulses without edema.  Abdomen: Soft, + BS.  Non tender, no guarding, rebound, hernias, masses. Lymphatics: Non tender without lymphadenopathy.  Musculoskeletal: Full ROM, 5/5 strength, normal gait.  Skin: Warm, dry without rashes, lesions, ecchymosis Large scaly erythematous plaques on bilateral legs, feet, arms, and some on chest Neuro: Cranial nerves intact. Normal muscle tone, no cerebellar symptoms. Sensation intact.  Psych: Awake and oriented X 3, normal affect, Insight and Judgment appropriate.     [name], PA-C 10:09 AM [facility]

## 2017-05-07 ENCOUNTER — Ambulatory Visit (INDEPENDENT_AMBULATORY_CARE_PROVIDER_SITE_OTHER): Payer: 59 | Admitting: Physician Assistant

## 2017-05-07 ENCOUNTER — Encounter: Payer: Self-pay | Admitting: Physician Assistant

## 2017-05-07 VITALS — BP 128/84 | HR 63 | Temp 97.5°F | Resp 14 | Ht 63.5 in | Wt 154.0 lb

## 2017-05-07 DIAGNOSIS — F3341 Major depressive disorder, recurrent, in partial remission: Secondary | ICD-10-CM

## 2017-05-07 DIAGNOSIS — E785 Hyperlipidemia, unspecified: Secondary | ICD-10-CM

## 2017-05-07 DIAGNOSIS — L4050 Arthropathic psoriasis, unspecified: Secondary | ICD-10-CM | POA: Diagnosis not present

## 2017-05-07 DIAGNOSIS — Z79899 Other long term (current) drug therapy: Secondary | ICD-10-CM | POA: Diagnosis not present

## 2017-05-07 DIAGNOSIS — E663 Overweight: Secondary | ICD-10-CM | POA: Diagnosis not present

## 2017-05-07 DIAGNOSIS — M4692 Unspecified inflammatory spondylopathy, cervical region: Secondary | ICD-10-CM

## 2017-05-07 DIAGNOSIS — Z111 Encounter for screening for respiratory tuberculosis: Secondary | ICD-10-CM

## 2017-05-07 DIAGNOSIS — M47812 Spondylosis without myelopathy or radiculopathy, cervical region: Secondary | ICD-10-CM

## 2017-05-07 DIAGNOSIS — I10 Essential (primary) hypertension: Secondary | ICD-10-CM | POA: Diagnosis not present

## 2017-05-07 LAB — LIPID PANEL
Cholesterol: 163 mg/dL (ref <200)
HDL: 49 mg/dL — ABNORMAL LOW (ref >50)
LDL Cholesterol: 102 mg/dL — ABNORMAL HIGH (ref <100)
Total CHOL/HDL Ratio: 3.3 Ratio (ref <5.0)
Triglycerides: 62 mg/dL (ref <150)
VLDL: 12 mg/dL (ref <30)

## 2017-05-07 LAB — HEPATIC FUNCTION PANEL
ALBUMIN: 4 g/dL (ref 3.6–5.1)
ALK PHOS: 61 U/L (ref 33–130)
ALT: 17 U/L (ref 6–29)
AST: 20 U/L (ref 10–35)
Bilirubin, Direct: 0.1 mg/dL (ref <=0.2)
Indirect Bilirubin: 0.4 mg/dL (ref 0.2–1.2)
TOTAL PROTEIN: 6.2 g/dL (ref 6.1–8.1)
Total Bilirubin: 0.5 mg/dL (ref 0.2–1.2)

## 2017-05-07 LAB — TSH: TSH: 1.62 mIU/L

## 2017-05-07 LAB — CBC WITH DIFFERENTIAL/PLATELET
BASOS ABS: 45 {cells}/uL (ref 0–200)
BASOS PCT: 1 %
EOS ABS: 270 {cells}/uL (ref 15–500)
Eosinophils Relative: 6 %
HCT: 36.6 % (ref 35.0–45.0)
Hemoglobin: 12.7 g/dL (ref 11.7–15.5)
Lymphocytes Relative: 28 %
Lymphs Abs: 1260 cells/uL (ref 850–3900)
MCH: 31.3 pg (ref 27.0–33.0)
MCHC: 34.7 g/dL (ref 32.0–36.0)
MCV: 90.1 fL (ref 80.0–100.0)
MONO ABS: 450 {cells}/uL (ref 200–950)
MPV: 10.3 fL (ref 7.5–12.5)
Monocytes Relative: 10 %
Neutro Abs: 2475 cells/uL (ref 1500–7800)
Neutrophils Relative %: 55 %
Platelets: 283 10*3/uL (ref 140–400)
RBC: 4.06 MIL/uL (ref 3.80–5.10)
RDW: 13.2 % (ref 11.0–15.0)
WBC: 4.5 10*3/uL (ref 3.8–10.8)

## 2017-05-07 LAB — BASIC METABOLIC PANEL WITH GFR
BUN: 15 mg/dL (ref 7–25)
CHLORIDE: 106 mmol/L (ref 98–110)
CO2: 23 mmol/L (ref 20–32)
Calcium: 9 mg/dL (ref 8.6–10.4)
Creat: 0.83 mg/dL (ref 0.50–0.99)
GFR, Est African American: 87 mL/min (ref >=60)
GFR, Est Non African American: 75 mL/min (ref >=60)
GLUCOSE: 95 mg/dL (ref 65–99)
POTASSIUM: 4.3 mmol/L (ref 3.5–5.3)
Sodium: 139 mmol/L (ref 135–146)

## 2017-05-07 MED ORDER — PHENTERMINE HCL 37.5 MG PO TABS: 37.5000 mg | ORAL_TABLET | Freq: Every day | ORAL | 2 refills | Status: DC

## 2017-05-08 LAB — POC HEMOCCULT BLD/STL (HOME/3-CARD/SCREEN)
FECAL OCCULT BLD: NEGATIVE
FECAL OCCULT BLD: NEGATIVE
Fecal Occult Blood, POC: NEGATIVE

## 2017-05-08 LAB — MAGNESIUM: MAGNESIUM: 2.1 mg/dL (ref 1.5–2.5)

## 2017-05-23 DIAGNOSIS — M150 Primary generalized (osteo)arthritis: Secondary | ICD-10-CM | POA: Diagnosis not present

## 2017-05-23 DIAGNOSIS — E559 Vitamin D deficiency, unspecified: Secondary | ICD-10-CM | POA: Diagnosis not present

## 2017-05-23 DIAGNOSIS — L4050 Arthropathic psoriasis, unspecified: Secondary | ICD-10-CM | POA: Diagnosis not present

## 2017-06-17 ENCOUNTER — Other Ambulatory Visit: Payer: Self-pay | Admitting: Physician Assistant

## 2017-06-17 DIAGNOSIS — E559 Vitamin D deficiency, unspecified: Secondary | ICD-10-CM

## 2017-06-19 DIAGNOSIS — S93492A Sprain of other ligament of left ankle, initial encounter: Secondary | ICD-10-CM | POA: Diagnosis not present

## 2017-07-24 DIAGNOSIS — L4050 Arthropathic psoriasis, unspecified: Secondary | ICD-10-CM | POA: Diagnosis not present

## 2017-07-24 DIAGNOSIS — M150 Primary generalized (osteo)arthritis: Secondary | ICD-10-CM | POA: Diagnosis not present

## 2017-07-24 DIAGNOSIS — E559 Vitamin D deficiency, unspecified: Secondary | ICD-10-CM | POA: Diagnosis not present

## 2017-08-30 ENCOUNTER — Ambulatory Visit: Payer: Self-pay | Admitting: Physician Assistant

## 2017-09-03 ENCOUNTER — Other Ambulatory Visit: Payer: Self-pay

## 2017-09-03 DIAGNOSIS — E559 Vitamin D deficiency, unspecified: Secondary | ICD-10-CM

## 2017-09-03 MED ORDER — VITAMIN D (ERGOCALCIFEROL) 1.25 MG (50000 UNIT) PO CAPS: ORAL_CAPSULE | ORAL | 1 refills | Status: DC

## 2017-09-28 ENCOUNTER — Ambulatory Visit: Payer: Self-pay | Admitting: Physician Assistant

## 2017-10-03 DIAGNOSIS — L400 Psoriasis vulgaris: Secondary | ICD-10-CM | POA: Diagnosis not present

## 2017-10-03 DIAGNOSIS — L309 Dermatitis, unspecified: Secondary | ICD-10-CM | POA: Diagnosis not present

## 2017-11-10 ENCOUNTER — Other Ambulatory Visit: Payer: Self-pay | Admitting: Internal Medicine

## 2017-11-10 DIAGNOSIS — F329 Major depressive disorder, single episode, unspecified: Secondary | ICD-10-CM

## 2017-11-10 DIAGNOSIS — F32A Depression, unspecified: Secondary | ICD-10-CM

## 2017-11-10 MED ORDER — BUPROPION HCL ER (XL) 300 MG PO TB24: 300.0000 mg | ORAL_TABLET | ORAL | 1 refills | Status: DC

## 2017-12-12 ENCOUNTER — Encounter: Payer: Self-pay | Admitting: Physician Assistant

## 2018-01-03 NOTE — Progress Notes (Deleted)
Complete Physical  Assessment and Plan  Encounter for general adult medical examination with abnormal findings Needs MGM  Hyperlipidemia, unspecified hyperlipidemia type -continue medications, check lipids, decrease fatty foods, increase activity.  -     Lipid panel  Essential hypertension - continue medications, DASH diet, exercise and monitor at home. Call if greater than 130/80.  -     CBC with Differential/Platelet -     BASIC METABOLIC PANEL WITH GFR -     Hepatic function panel -     TSH -     Urinalysis, Routine w reflex microscopic -     Microalbumin / creatinine urine ratio -     EKG 12-Lead  Psoriatic arthritis ([place]) On DMARD,continue follow up Dr. Amil Amen  Vitamin D deficiency -     VITAMIN D 25 Hydroxy (Vit-D Deficiency, Fractures) -     Vitamin D, Ergocalciferol, (DRISDOL) 50000 units CAPS capsule; 1 pill 3 days a week for vitamin d deficiency  Medication management -     Magnesium  Recurrent major depressive disorder, in partial remission ([place]) - continue medications, stress management techniques discussed, increase water, good sleep hygiene discussed, increase exercise, and increase veggies.   Cervical arthritis ([place]) Continue follow up ortho  B12 deficiency -     Iron and TIBC -     Ferritin -     Vitamin B12  Hemorrhoids, unspecified hemorrhoid type Follow up GI -     hydrocortisone (ANUSOL-HC) 2.5 % rectal cream; Place 1 application rectally 2 (two) times daily.  Overweight (BMI 25.0-29.9) -     phentermine (ADIPEX-P) 37.5 MG tablet; Take 1 tablet (37.5 mg total) by mouth daily before breakfast.  Screening for viral disease -     HIV antibody -     Hepatitis C antibody  Left thyroid nodule Will monitor  Osteoarthritis, unspecified osteoarthritis type, unspecified site Continue follow up ortho  Discussed med's effects and SE's. Screening labs and tests as requested with regular follow-up as recommended. Over 40 minutes of exam, counseling,  chart review, and complex, high level critical decision making was performed this visit.  Future Appointments  Date Time Provider [place]  01/07/2018  9:00 AM [name], PA-C [facility] None  01/08/2019  9:00 AM [name], PA-C [facility] None    HPI  62 y.o. female  presents for a complete physical.  She has psoriatic arthritis and is seeing Dr. Amil Amen, she is on stelera and doing very well. Following with ortho for osteoarthritis.  Her blood pressure has been controlled at home, today their BP is    She does not workout. She denies chest pain, shortness of breath, dizziness.  She is not on cholesterol medication and denies myalgias. Her cholesterol is at goal. The cholesterol last visit was:   Lab Results  Component Value Date   CHOL 163 05/07/2017   HDL 49 (L) 05/07/2017   LDLCALC 102 (H) 05/07/2017   TRIG 62 05/07/2017   CHOLHDL 3.3 05/07/2017   She has been working on diet and exercise for prediabetes, she is not on bASA, she is not on ACE/ARB and denies paresthesia of the feet, polydipsia, polyuria and visual disturbances. Last A1C in the office was:  Lab Results  Component Value Date   HGBA1C 5.4 08/17/2016  Last GFR:  Lab Results  Component Value Date   GFRNONAA 75 05/07/2017  Patient is on Vitamin D supplement.   Lab Results  Component Value Date   VD25OH 44 01/05/2017  Daughter, Cloyde Reams, passed 2 years ago in June from pneumonia, was suppose to be getting married,  mom is 29 with dementia. Has been stressed due to tax season, still greifing but doing much better.   BMI is There is no height or weight on file to calculate BMI., she is working on diet and exercise. Wt Readings from Last 3 Encounters:  05/07/17 154 lb (69.9 kg)  01/05/17 161 lb (73 kg)  08/17/16 156 lb (70.8 kg)    Current Medications:  Current Outpatient Medications on File Prior to Visit  Medication Sig Dispense Refill  . acetaminophen (TYLENOL) 500 MG tablet Take by mouth  every 6 (six) hours as needed.    Marland Kitchen buPROPion (WELLBUTRIN XL) 300 MG 24 hr tablet Take 1 tablet (300 mg total) by mouth every morning. 90 tablet 1  . hydrocortisone (ANUSOL-HC) 2.5 % rectal cream Place 1 application rectally 2 (two) times daily. 30 g 0  . phentermine (ADIPEX-P) 37.5 MG tablet Take 1 tablet (37.5 mg total) by mouth daily before breakfast. 30 tablet 2  . ustekinumab (STELARA) 45 MG/0.5ML SOSY injection Inject 45 mg into the skin. Injects once every quarter    . Vitamin D, Ergocalciferol, (DRISDOL) 50000 units CAPS capsule TAKE ONE CAPSULE BY MOUTH THREE TIMES WEEKLY FOR DEFICIENCY 36 capsule 1   No current facility-administered medications on file prior to visit.    Health Maintenance: Immunization History  Administered Date(s) Administered  . PPD Test 05/07/2017  . Tdap 12/09/2015   Tetanus: 2017 Pneumovax: N/A Prevnar 13: N/A Flu vaccine: declines Zostavax: will check with Dr. Amil Amen  LMP: 20+ years s/p TAH Pap: 2017 MGM: 12/2012 DUE DEXA: never Colonoscopy: 2014  Patient Care Team: Unk Pinto, MD as PCP - General (Internal Medicine) Hennie Duos, MD as Consulting Physician (Rheumatology)  Medical History:  Past Medical History:  Diagnosis Date  . Allergy   . Mass of chest wall 12/2012   tender to the touch  . Osteoarthritis   . Seasonal allergies    Allergies No Known Allergies  SURGICAL HISTORY She  has a past surgical history that includes Cholecystectomy; Shoulder surgery (03/2012); Cervical fusion (2005); Anterior cervical decomp/discectomy fusion (08/08/2012); Total shoulder arthroplasty (09/24/2012); Eye surgery; Lumbar laminectomy/decompression microdiscectomy (01/01/2013); Lumbar fusion (01/01/2013); Abdominal hysterectomy (1997); Mass excision (N/A, 01/29/2013); and Hemorrhoid surgery (N/A, 09/02/2013). FAMILY HISTORY Her family history includes Pancreatitis in her father. SOCIAL HISTORY She  reports that she has never smoked. She has never  used smokeless tobacco. She reports that she does not drink alcohol or use drugs.   Review of Systems: Review of Systems  Constitutional: Positive for malaise/fatigue. Negative for chills, diaphoresis, fever and weight loss.  Eyes: Negative.   Respiratory: Negative.   Cardiovascular: Negative for chest pain, palpitations, orthopnea, claudication, leg swelling and PND.  Gastrointestinal: Negative.   Genitourinary: Negative.        + incontinence  Musculoskeletal: Positive for back pain and neck pain. Negative for falls, joint pain and myalgias.  Skin: Negative.   Neurological: Negative.  Negative for dizziness and weakness.  Psychiatric/Behavioral: Negative for depression (improved with wellbutrin), hallucinations, memory loss, substance abuse and suicidal ideas. The patient is not nervous/anxious and does not have insomnia.     Physical Exam: Estimated body mass index is 26.85 kg/m as calculated from the following:   Height as of 05/07/17: 5' 3.5" (1.613 m).   Weight as of 05/07/17: 154 lb (69.9 kg). There were no vitals taken for this visit. General Appearance:  Well nourished, in no apparent distress.  Eyes: PERRLA, EOMs, conjunctiva no swelling or erythema, normal fundi and vessels.  Sinuses: No Frontal/maxillary tenderness  ENT/Mouth: Ext aud canals clear, normal light reflex with TMs without erythema, bulging. Good dentition. No erythema, swelling, or exudate on post pharynx. Tonsils not swollen or erythematous. Hearing normal.  Neck: Supple, thyroid normal. No bruits  Respiratory: Respiratory effort normal, BS equal bilaterally without rales, rhonchi, wheezing or stridor.  Cardio: RRR without murmurs, rubs or gallops. Brisk peripheral pulses without edema.  Chest: symmetric, with normal excursions and percussion.  Breasts: Symmetric, well healing reconstructive scars, without nipple discharge, retractions.  Abdomen: Soft, nontender, no guarding, rebound, hernias, masses, or  organomegaly.  Lymphatics: Non tender without lymphadenopathy.  Genitourinary: defer Musculoskeletal: Full ROM all peripheral extremities,5/5 strength, and normal gait.  Skin: Warm, dry without rashes, lesions, ecchymosis. Neuro: Cranial nerves intact, reflexes equal bilaterally. Normal muscle tone, no cerebellar symptoms. Sensation intact.  Psych: Awake and oriented X 3, normal affect, Insight and Judgment appropriate.   EKG: WNL no ST changes. AORTA SCAN: defer  [name] 7:47 AM [facility]

## 2018-01-07 ENCOUNTER — Encounter: Payer: Self-pay | Admitting: Physician Assistant

## 2018-01-29 ENCOUNTER — Encounter: Payer: Self-pay | Admitting: Physician Assistant

## 2018-02-04 NOTE — Progress Notes (Signed)
Complete Physical  Assessment and Plan:  Diagnoses and all orders for this visit:  Encounter for general adult medical examination with abnormal findings  Hemorrhoids, unspecified hemorrhoid type Continue anusol PRN Follow up with GI for worsening symptoms  Essential hypertension Currently at goal by lifestyle modification Monitor blood pressure at home; call if consistently over 130/80 Continue DASH diet.   Reminder to go to the ER if any CP, SOB, nausea, dizziness, severe HA, changes vision/speech, left arm numbness and tingling and jaw pain.  Left thyroid nodule Continue to monitor  Psoriatic arthritis ([place]) Continue DMARD Follows with Dr. Amil Amen  Vitamin D deficiency Continue supplementation Check vitamin D level  Recurrent major depressive disorder, in partial remission ([place]) Continue medications  Lifestyle discussed: diet/exerise, sleep hygiene, stress management, hydration  Medication management CBC, CMP/GFR  Hyperlipidemia, unspecified hyperlipidemia type Borderline; not currently on medications Continue low cholesterol diet and exercise.  Check lipid panel.   Allergic state, sequela Continue OTC allergy pills  Screening for deficiency anemia Vitamin B12  Screening for hematuria or proteinuria UA, microalbumin  Overweight (BMI 25.0-29.9) Long discussion about weight loss, diet, and exercise Recommended diet heavy in fruits and veggies and low in animal meats, cheeses, and dairy products, appropriate calorie intake Patient will work on avoiding junk food Will restart phentermine for 3 month course and follow up  Discussed appropriate weight for height  Follow up at next visit  Other abnormal glucose Discussed disease and risks Discussed diet/exercise, weight management  A1C   Discussed med's effects and SE's. Screening labs and tests as requested with regular follow-up as recommended. Over 40 minutes of exam, counseling, chart review, and  complex, high level critical decision making was performed this visit.   Future Appointments  Date Time Provider [place]  02/07/2019  9:30 AM Liane Comber, NP [facility] None     HPI  62 y.o. female  presents for a complete physical and follow up for has Left thyroid nodule; Hemorrhoids; Allergy; Other abnormal glucose; Hyperlipidemia; Psoriatic arthritis ([place]); Essential hypertension; Vitamin D deficiency; Medication management; Recurrent major depressive disorder, in partial remission ([place]); Cervical arthritis; and Overweight (BMI 25.0-29.9) on their problem list. She follows with Dr. Amil Amen for psoriatic arthritis. She also follows with Dr. Sherley Bounds with [facility] for her back, and Dr. Mardelle Matte for her shoulder. She follows annually with Dr. Allyson Sabal for skin.  BMI is Body mass index is 30.29 kg/m., she has been working on diet and exercise. Wt Readings from Last 3 Encounters:  02/05/18 155 lb 1.6 oz (70.4 kg)  05/07/17 154 lb (69.9 kg)  01/05/17 161 lb (73 kg)   Her blood pressure has been controlled at home, today their BP is BP: 114/82 She does not workout. She denies chest pain, shortness of breath, dizziness.   She is not on cholesterol medication and denies myalgias. Her cholesterol is not at goal. The cholesterol last visit was:   Lab Results  Component Value Date   CHOL 163 05/07/2017   HDL 49 (L) 05/07/2017   LDLCALC 102 (H) 05/07/2017   TRIG 62 05/07/2017   CHOLHDL 3.3 05/07/2017    Last A1C in the office was:  Lab Results  Component Value Date   HGBA1C 5.4 08/17/2016   Last GFR: Lab Results  Component Value Date   GFRNONAA 75 05/07/2017   Patient is on Vitamin D supplement.  Lab Results  Component Value Date   VD25OH 44 01/05/2017      Current Medications:  Current  Outpatient Medications on File Prior to Visit  Medication Sig Dispense Refill  . acetaminophen (TYLENOL) 500 MG tablet Take by mouth every 6 (six) hours as needed.    Marland Kitchen  buPROPion (WELLBUTRIN XL) 300 MG 24 hr tablet Take 1 tablet (300 mg total) by mouth every morning. 90 tablet 1  . folic acid (FOLVITE) 1 MG tablet Take 1 mg by mouth daily.    . hydrocortisone (ANUSOL-HC) 2.5 % rectal cream Place 1 application rectally 2 (two) times daily. 30 g 0  . methotrexate (RHEUMATREX) 10 MG tablet Take 10 mg by mouth. Caution: Chemotherapy. Protect from light. Takes 12 tablets a week.    . ustekinumab (STELARA) 45 MG/0.5ML SOSY injection Inject 45 mg into the skin. Injects once every quarter    . Vitamin D, Ergocalciferol, (DRISDOL) 50000 units CAPS capsule TAKE ONE CAPSULE BY MOUTH THREE TIMES WEEKLY FOR DEFICIENCY 36 capsule 1   No current facility-administered medications on file prior to visit.    Allergies:  No Known Allergies Medical History:  She has Left thyroid nodule; Hemorrhoids; Allergy; Other abnormal glucose; Hyperlipidemia; Psoriatic arthritis ([place]); Essential hypertension; Vitamin D deficiency; Medication management; Recurrent major depressive disorder, in partial remission ([place]); Cervical arthritis; and Overweight (BMI 25.0-29.9) on their problem list. Health Maintenance:   Immunization History  Administered Date(s) Administered  . PPD Test 05/07/2017  . Tdap 12/09/2015   Tetanus: 2017 Pneumovax: N/A Prevnar 13: N/A Flu vaccine: declines Zostavax: will check with Dr. Amil Amen  LMP: 20+ years s/p TAH Pap: 2017  MGM: 12/2012 DUE - will schedule - willing to get but keeps forgetting DEXA: Get next year Colonoscopy: 2014 - DUE - needed 5 year follow up  Last Dental Exam: q6 months, 2018 Last Eye Exam: 2017, has up appointment  Patient Care Team: Unk Pinto, MD as PCP - General (Internal Medicine) Hennie Duos, MD as Consulting Physician (Rheumatology)  Surgical History:  She has a past surgical history that includes Cholecystectomy (1997); Shoulder surgery (03/2012); Cervical fusion (2005); Anterior cervical decomp/discectomy fusion  (08/08/2012); Total shoulder arthroplasty (09/24/2012); Eye surgery; Lumbar laminectomy/decompression microdiscectomy (01/01/2013); Lumbar fusion (01/01/2013); Abdominal hysterectomy (1997); Mass excision (N/A, 01/29/2013); and Hemorrhoid surgery (N/A, 09/02/2013). Family History:  Herfamily history includes Pancreatitis in her father. Social History:  She reports that she has never smoked. She has never used smokeless tobacco. She reports that she does not drink alcohol or use drugs.  Review of Systems: Review of Systems  Constitutional: Negative for malaise/fatigue and weight loss.  HENT: Negative for hearing loss and tinnitus.   Eyes: Negative for blurred vision and double vision.  Respiratory: Negative for cough, sputum production, shortness of breath and wheezing.   Cardiovascular: Negative for chest pain, palpitations, orthopnea, claudication, leg swelling and PND.  Gastrointestinal: Negative for abdominal pain, blood in stool, constipation, diarrhea, heartburn, melena, nausea and vomiting.  Genitourinary: Negative.   Musculoskeletal: Negative for falls, joint pain and myalgias.  Skin: Negative for rash.  Neurological: Negative for dizziness, tingling, sensory change, weakness and headaches.  Endo/Heme/Allergies: Negative for polydipsia.  Psychiatric/Behavioral: Negative.  Negative for depression, memory loss, substance abuse and suicidal ideas. The patient is not nervous/anxious and does not have insomnia.   All other systems reviewed and are negative.   Physical Exam: Estimated body mass index is 30.29 kg/m as calculated from the following:   Height as of this encounter: 5' (1.524 m).   Weight as of this encounter: 155 lb 1.6 oz (70.4 kg). BP 114/82   Pulse 76  Temp 97.8 F (36.6 C)   Ht 5' (1.524 m)   Wt 155 lb 1.6 oz (70.4 kg)   HC 63.25" (160.7 cm)   BMI 30.29 kg/m  General Appearance: Well nourished, in no apparent distress.  Eyes: PERRLA, EOMs, conjunctiva no swelling or  erythema, normal fundi and vessels.  Sinuses: No Frontal/maxillary tenderness  ENT/Mouth: Ext aud canals clear, normal light reflex with TMs without erythema, bulging. Good dentition. No erythema, swelling, or exudate on post pharynx. Tonsils not swollen or erythematous. Hearing normal.  Neck: Supple, thyroid normal. No bruits  Respiratory: Respiratory effort normal, BS equal bilaterally without rales, rhonchi, wheezing or stridor.  Cardio: RRR without murmurs, rubs or gallops. Brisk peripheral pulses without edema.  Chest: symmetric, with normal excursions and percussion.  Breasts: Symmetric, without lumps, nipple discharge, retractions.  Abdomen: Soft, nontender, no guarding, rebound, hernias, masses, or organomegaly.  Lymphatics: Non tender without lymphadenopathy.  Genitourinary: Defer Musculoskeletal: Full ROM all peripheral extremities,5/5 strength, and normal gait.  Skin: Warm, dry, lesions, ecchymosis. psoriatic rash scattered - erythematous, scaly/flaky Neuro: Cranial nerves intact, reflexes equal bilaterally. Normal muscle tone, no cerebellar symptoms. Sensation intact.  Psych: Awake and oriented X 3, normal affect, Insight and Judgment appropriate.   EKG: WNL no ST changes.  Gorden Harms Khalil Belote 9:24 AM [place] [facility]

## 2018-02-05 ENCOUNTER — Encounter: Payer: Self-pay | Admitting: Adult Health

## 2018-02-05 ENCOUNTER — Ambulatory Visit: Payer: 59 | Admitting: Adult Health

## 2018-02-05 VITALS — BP 114/82 | HR 76 | Temp 97.8°F | Ht 60.0 in | Wt 155.1 lb

## 2018-02-05 DIAGNOSIS — Z1389 Encounter for screening for other disorder: Secondary | ICD-10-CM

## 2018-02-05 DIAGNOSIS — R7309 Other abnormal glucose: Secondary | ICD-10-CM

## 2018-02-05 DIAGNOSIS — Z136 Encounter for screening for cardiovascular disorders: Secondary | ICD-10-CM

## 2018-02-05 DIAGNOSIS — E559 Vitamin D deficiency, unspecified: Secondary | ICD-10-CM

## 2018-02-05 DIAGNOSIS — K649 Unspecified hemorrhoids: Secondary | ICD-10-CM

## 2018-02-05 DIAGNOSIS — I10 Essential (primary) hypertension: Secondary | ICD-10-CM | POA: Diagnosis not present

## 2018-02-05 DIAGNOSIS — Z1211 Encounter for screening for malignant neoplasm of colon: Secondary | ICD-10-CM

## 2018-02-05 DIAGNOSIS — Z0001 Encounter for general adult medical examination with abnormal findings: Secondary | ICD-10-CM

## 2018-02-05 DIAGNOSIS — E041 Nontoxic single thyroid nodule: Secondary | ICD-10-CM

## 2018-02-05 DIAGNOSIS — Z1329 Encounter for screening for other suspected endocrine disorder: Secondary | ICD-10-CM

## 2018-02-05 DIAGNOSIS — E663 Overweight: Secondary | ICD-10-CM

## 2018-02-05 DIAGNOSIS — L4050 Arthropathic psoriasis, unspecified: Secondary | ICD-10-CM

## 2018-02-05 DIAGNOSIS — Z79899 Other long term (current) drug therapy: Secondary | ICD-10-CM

## 2018-02-05 DIAGNOSIS — Z1231 Encounter for screening mammogram for malignant neoplasm of breast: Secondary | ICD-10-CM

## 2018-02-05 DIAGNOSIS — F3341 Major depressive disorder, recurrent, in partial remission: Secondary | ICD-10-CM

## 2018-02-05 DIAGNOSIS — Z130 Encounter for screening for diseases of the blood and blood-forming organs and certain disorders involving the immune mechanism: Secondary | ICD-10-CM

## 2018-02-05 DIAGNOSIS — T7840XS Allergy, unspecified, sequela: Secondary | ICD-10-CM

## 2018-02-05 DIAGNOSIS — E785 Hyperlipidemia, unspecified: Secondary | ICD-10-CM

## 2018-02-05 DIAGNOSIS — Z0000 Encounter for general adult medical examination without abnormal findings: Secondary | ICD-10-CM

## 2018-02-05 MED ORDER — PHENTERMINE HCL 37.5 MG PO TABS: 37.5000 mg | ORAL_TABLET | Freq: Every day | ORAL | 2 refills | Status: DC

## 2018-02-05 NOTE — Patient Instructions (Signed)
Aim for 7+ servings of fruits and vegetables daily  80+ fluid ounces of water or unsweet tea for healthy kidneys  1 drink of alcohol per day  Limit animal fats in diet for cholesterol and heart health - choose grass fed whenever available  Aim for low stress - take time to unwind and care for your mental health  Aim for 150 min of moderate intensity exercise weekly for heart health, and weights twice weekly for bone health  Aim for 7-9 hours of sleep daily      When it comes to diets, agreement about the perfect plan isn't easy to find, even among the experts. Experts at the [employer] developed an idea known as the Healthy Eating Plate. Just imagine a plate divided into logical, healthy portions.  The emphasis is on diet quality:  Load up on vegetables and fruits - one-half of your plate: Aim for color and variety, and remember that potatoes don't count.  Go for whole grains - one-quarter of your plate: Whole wheat, barley, wheat berries, quinoa, oats, brown rice, and foods made with them. If you want pasta, go with whole wheat pasta.  Protein power - one-quarter of your plate: Fish, chicken, beans, and nuts are all healthy, versatile protein sources. Limit red meat.  The diet, however, does go beyond the plate, offering a few other suggestions.  Use healthy plant oils, such as olive, canola, soy, corn, sunflower and peanut. Check the labels, and avoid partially hydrogenated oil, which have unhealthy trans fats.  If you're thirsty, drink water. Coffee and tea are good in moderation, but skip sugary drinks and limit milk and dairy products to one or two daily servings.  The type of carbohydrate in the diet is more important than the amount. Some sources of carbohydrates, such as vegetables, fruits, whole grains, and beans-are healthier than others.  Finally, stay active.  

## 2018-02-06 ENCOUNTER — Other Ambulatory Visit: Payer: Self-pay | Admitting: Adult Health

## 2018-02-06 DIAGNOSIS — Z1231 Encounter for screening mammogram for malignant neoplasm of breast: Secondary | ICD-10-CM

## 2018-02-06 LAB — COMPLETE METABOLIC PANEL WITH GFR
AG RATIO: 1.8 (calc) (ref 1.0–2.5)
ALKALINE PHOSPHATASE (APISO): 54 U/L (ref 33–130)
ALT: 18 U/L (ref 6–29)
AST: 19 U/L (ref 10–35)
Albumin: 4.2 g/dL (ref 3.6–5.1)
BILIRUBIN TOTAL: 0.5 mg/dL (ref 0.2–1.2)
BUN: 12 mg/dL (ref 7–25)
CHLORIDE: 105 mmol/L (ref 98–110)
CO2: 27 mmol/L (ref 20–32)
Calcium: 9.4 mg/dL (ref 8.6–10.4)
Creat: 0.8 mg/dL (ref 0.50–0.99)
GFR, Est African American: 90 mL/min/{1.73_m2} (ref >=60)
GFR, Est Non African American: 78 mL/min/{1.73_m2} (ref >=60)
GLUCOSE: 102 mg/dL — AB (ref 65–99)
Globulin: 2.4 g/dL (calc) (ref 1.9–3.7)
POTASSIUM: 4.2 mmol/L (ref 3.5–5.3)
Sodium: 140 mmol/L (ref 135–146)
Total Protein: 6.6 g/dL (ref 6.1–8.1)

## 2018-02-06 LAB — CBC WITH DIFFERENTIAL/PLATELET
BASOS ABS: 32 {cells}/uL (ref 0–200)
Basophils Relative: 0.5 %
EOS ABS: 122 {cells}/uL (ref 15–500)
Eosinophils Relative: 1.9 %
HCT: 37.4 % (ref 35.0–45.0)
HEMOGLOBIN: 12.6 g/dL (ref 11.7–15.5)
Lymphs Abs: 1459 cells/uL (ref 850–3900)
MCH: 29.9 pg (ref 27.0–33.0)
MCHC: 33.7 g/dL (ref 32.0–36.0)
MCV: 88.8 fL (ref 80.0–100.0)
MONOS PCT: 5.6 %
MPV: 10.6 fL (ref 7.5–12.5)
Neutro Abs: 4429 cells/uL (ref 1500–7800)
Neutrophils Relative %: 69.2 %
PLATELETS: 285 10*3/uL (ref 140–400)
RBC: 4.21 10*6/uL (ref 3.80–5.10)
RDW: 13.7 % (ref 11.0–15.0)
TOTAL LYMPHOCYTE: 22.8 %
WBC: 6.4 10*3/uL (ref 3.8–10.8)
WBCMIX: 358 {cells}/uL (ref 200–950)

## 2018-02-06 LAB — MICROALBUMIN / CREATININE URINE RATIO
Creatinine, Urine: 100 mg/dL (ref 20–275)
MICROALB/CREAT RATIO: 2 ug/mg{creat} (ref <30)
Microalb, Ur: 0.2 mg/dL

## 2018-02-06 LAB — LIPID PANEL
CHOLESTEROL: 213 mg/dL — AB (ref <200)
HDL: 58 mg/dL (ref >50)
LDL CHOLESTEROL (CALC): 134 mg/dL — AB
Non-HDL Cholesterol (Calc): 155 mg/dL (calc) — ABNORMAL HIGH (ref <130)
Total CHOL/HDL Ratio: 3.7 (calc) (ref <5.0)
Triglycerides: 100 mg/dL (ref <150)

## 2018-02-06 LAB — URINALYSIS W MICROSCOPIC + REFLEX CULTURE
BILIRUBIN URINE: NEGATIVE
Bacteria, UA: NONE SEEN /HPF
Glucose, UA: NEGATIVE
HYALINE CAST: NONE SEEN /LPF
Hgb urine dipstick: NEGATIVE
Ketones, ur: NEGATIVE
Leukocyte Esterase: NEGATIVE
Nitrites, Initial: NEGATIVE
Protein, ur: NEGATIVE
RBC / HPF: NONE SEEN /HPF (ref 0–2)
SPECIFIC GRAVITY, URINE: 1.026 (ref 1.001–1.03)
Squamous Epithelial / LPF: NONE SEEN /HPF (ref <=5)
WBC, UA: NONE SEEN /HPF (ref 0–5)
pH: <=5 (ref 5.0–8.0)

## 2018-02-06 LAB — VITAMIN B12: VITAMIN B 12: 276 pg/mL (ref 200–1100)

## 2018-02-06 LAB — VITAMIN D 25 HYDROXY (VIT D DEFICIENCY, FRACTURES): Vit D, 25-Hydroxy: 46 ng/mL (ref 30–100)

## 2018-02-06 LAB — HEMOGLOBIN A1C
HEMOGLOBIN A1C: 5.6 %{Hb} (ref <5.7)
MEAN PLASMA GLUCOSE: 114 (calc)
eAG (mmol/L): 6.3 (calc)

## 2018-02-06 LAB — TSH: TSH: 1.41 m[IU]/L (ref 0.40–4.50)

## 2018-02-06 LAB — NO CULTURE INDICATED

## 2018-02-21 ENCOUNTER — Ambulatory Visit
Admission: RE | Admit: 2018-02-21 | Discharge: 2018-02-21 | Disposition: A | Discharge status: Home / Self Care | Payer: 59 | Source: Ambulatory Visit | Attending: Adult Health | Admitting: Adult Health

## 2018-02-21 DIAGNOSIS — Z1231 Encounter for screening mammogram for malignant neoplasm of breast: Secondary | ICD-10-CM

## 2018-04-10 DIAGNOSIS — E559 Vitamin D deficiency, unspecified: Secondary | ICD-10-CM | POA: Diagnosis not present

## 2018-04-10 DIAGNOSIS — M150 Primary generalized (osteo)arthritis: Secondary | ICD-10-CM | POA: Diagnosis not present

## 2018-04-10 DIAGNOSIS — L4050 Arthropathic psoriasis, unspecified: Secondary | ICD-10-CM | POA: Diagnosis not present

## 2018-04-16 DIAGNOSIS — L400 Psoriasis vulgaris: Secondary | ICD-10-CM | POA: Diagnosis not present

## 2018-04-16 DIAGNOSIS — L570 Actinic keratosis: Secondary | ICD-10-CM | POA: Diagnosis not present

## 2018-04-16 DIAGNOSIS — L819 Disorder of pigmentation, unspecified: Secondary | ICD-10-CM | POA: Diagnosis not present

## 2018-05-05 ENCOUNTER — Other Ambulatory Visit: Payer: Self-pay | Admitting: Internal Medicine

## 2018-05-05 DIAGNOSIS — F329 Major depressive disorder, single episode, unspecified: Secondary | ICD-10-CM

## 2018-05-05 DIAGNOSIS — F32A Depression, unspecified: Secondary | ICD-10-CM

## 2018-05-12 ENCOUNTER — Other Ambulatory Visit: Payer: Self-pay | Admitting: Physician Assistant

## 2018-05-12 DIAGNOSIS — E559 Vitamin D deficiency, unspecified: Secondary | ICD-10-CM

## 2018-05-28 NOTE — Progress Notes (Deleted)
FOLLOW UP  Assessment and Plan:   Hypertension Well controlled with current medications  Monitor blood pressure at home; patient to call if consistently greater than 130/80 Continue DASH diet.   Reminder to go to the ER if any CP, SOB, nausea, dizziness, severe HA, changes vision/speech, left arm numbness and tingling and jaw pain.  Cholesterol Currently above goal; *** Continue low cholesterol diet and exercise.  Check lipid panel.   Other abnormal glucose Recent A1Cs at goal Discussed diet/exercise, weight management  Defer A1C; check CMP  Overweight with co morbidities Long discussion about weight loss, diet, and exercise Recommended diet heavy in fruits and veggies and low in animal meats, cheeses, and dairy products, appropriate calorie intake Discussed ideal weight for height and initial weight goal (***) Patient will work on *** Patient on phentermine with benefit and no SE, taking drug breaks; continue close follow up. Return in ***  Will follow up in 3 months  Vitamin D Def Below goal at last visit; She has *** increased supplement Defer Vit D level to next visit  Depression Continue medications  Lifestyle discussed: diet/exerise, sleep hygiene, stress management, hydration  Continue diet and meds as discussed. Further disposition pending results of labs. Discussed med's effects and SE's.   Over 30 minutes of exam, counseling, chart review, and critical decision making was performed.   Future Appointments  Date Time Provider [place]  05/29/2018  8:45 AM Liane Comber, NP [facility] None  02/07/2019  9:30 AM Liane Comber, NP [facility] None    ----------------------------------------------------------------------------------------------------------------------  HPI 62 y.o. female  presents for 3 month follow up on hypertension, cholesterol, glucose management, weight and vitamin D deficiency. She follows with Dr. Amil Amen for psoriatic arthritis.  She also follows with Dr. Sherley Bounds with [facility] for her back, and Dr. Mardelle Matte for her shoulder.  She has depression in remission on wellbutrin 300 mg XL and continues to do well on this.   she is prescribed phentermine for weight loss.  While on the medication they have lost {NUMBERS 0-12:18577} lbs since last visit. They deny palpitations, anxiety, trouble sleeping, elevated BP.   BMI is There is no height or weight on file to calculate BMI., she is working on diet and exercise. Wt Readings from Last 3 Encounters:  02/05/18 155 lb 1.6 oz (70.4 kg)  05/07/17 154 lb (69.9 kg)  01/05/17 161 lb (73 kg)   Typical breakfast: Typical lunch:  Typical dinner: Exercise:  Water intake:   Her blood pressure {HAS HAS NOT:18834} been controlled at home, today their BP is    She {DOES_DOES FXT:02409} workout. She denies chest pain, shortness of breath, dizziness.   She is not on cholesterol medication and denies myalgias. Her cholesterol is not at goal. The cholesterol last visit was:   Lab Results  Component Value Date   CHOL 213 (H) 02/05/2018   HDL 58 02/05/2018   LDLCALC 134 (H) 02/05/2018   TRIG 100 02/05/2018   CHOLHDL 3.7 02/05/2018    She {Has/has not:18111} been working on diet and exercise for glucose management, and denies {Symptoms; diabetes w/o none:19199}. Last A1C in the office was:  Lab Results  Component Value Date   HGBA1C 5.6 02/05/2018   Patient is on Vitamin D supplement.   Lab Results  Component Value Date   VD25OH 46 02/05/2018        Current Medications:  Current Outpatient Medications on File Prior to Visit  Medication Sig  . acetaminophen (TYLENOL) 500 MG  tablet Take by mouth every 6 (six) hours as needed.  Marland Kitchen buPROPion (WELLBUTRIN XL) 300 MG 24 hr tablet TAKE 1 TABLET BY MOUTH EVERY DAY IN THE MORNING  . folic acid (FOLVITE) 1 MG tablet Take 1 mg by mouth daily.  . hydrocortisone (ANUSOL-HC) 2.5 % rectal cream Place 1 application rectally 2  (two) times daily.  . methotrexate (RHEUMATREX) 10 MG tablet Take 10 mg by mouth. Caution: Chemotherapy. Protect from light. Takes 12 tablets a week.  . phentermine (ADIPEX-P) 37.5 MG tablet Take 1 tablet (37.5 mg total) by mouth daily before breakfast.  . ustekinumab (STELARA) 45 MG/0.5ML SOSY injection Inject 45 mg into the skin. Injects once every quarter  . Vitamin D, Ergocalciferol, (DRISDOL) 50000 units CAPS capsule TAKE ONE CAPSULE BY MOUTH THREE TIMES WEEKLY FOR DEFICIENCY   No current facility-administered medications on file prior to visit.      Allergies: No Known Allergies   Medical History:  Past Medical History:  Diagnosis Date  . Allergy   . Mass of chest wall 12/2012   tender to the touch  . Osteoarthritis   . Seasonal allergies    Family history- Reviewed and unchanged Social history- Reviewed and unchanged   Review of Systems:  Review of Systems  Constitutional: Negative for malaise/fatigue and weight loss.  HENT: Negative for hearing loss and tinnitus.   Eyes: Negative for blurred vision and double vision.  Respiratory: Negative for cough, shortness of breath and wheezing.   Cardiovascular: Negative for chest pain, palpitations, orthopnea, claudication and leg swelling.  Gastrointestinal: Negative for abdominal pain, blood in stool, constipation, diarrhea, heartburn, melena, nausea and vomiting.  Genitourinary: Negative.   Musculoskeletal: Negative for joint pain and myalgias.  Skin: Negative for rash.  Neurological: Negative for dizziness, tingling, sensory change, weakness and headaches.  Endo/Heme/Allergies: Negative for polydipsia.  Psychiatric/Behavioral: Negative.   All other systems reviewed and are negative.     Physical Exam: There were no vitals taken for this visit. Wt Readings from Last 3 Encounters:  02/05/18 155 lb 1.6 oz (70.4 kg)  05/07/17 154 lb (69.9 kg)  01/05/17 161 lb (73 kg)   General Appearance: Well nourished, in no apparent  distress. Eyes: PERRLA, EOMs, conjunctiva no swelling or erythema Sinuses: No Frontal/maxillary tenderness ENT/Mouth: Ext aud canals clear, TMs without erythema, bulging. No erythema, swelling, or exudate on post pharynx.  Tonsils not swollen or erythematous. Hearing normal.  Neck: Supple, thyroid normal.  Respiratory: Respiratory effort normal, BS equal bilaterally without rales, rhonchi, wheezing or stridor.  Cardio: RRR with no MRGs. Brisk peripheral pulses without edema.  Abdomen: Soft, + BS.  Non tender, no guarding, rebound, hernias, masses. Lymphatics: Non tender without lymphadenopathy.  Musculoskeletal: Full ROM, 5/5 strength, {PSY - GAIT AND STATION:22860} gait Skin: Warm, dry without rashes, lesions, ecchymosis.  Neuro: Cranial nerves intact. No cerebellar symptoms.  Psych: Awake and oriented X 3, normal affect, Insight and Judgment appropriate.    Izora Ribas, NP 8:08 AM [facility]

## 2018-05-29 ENCOUNTER — Ambulatory Visit: Payer: Self-pay | Admitting: Adult Health

## 2018-06-27 ENCOUNTER — Ambulatory Visit: Payer: Self-pay | Admitting: Adult Health

## 2018-07-24 NOTE — Progress Notes (Signed)
FOLLOW UP  Assessment and Plan:   Hypertension Well controlled at this time off of medications Monitor blood pressure at home; patient to call if consistently greater than 130/80 Continue DASH diet.   Reminder to go to the ER if any CP, SOB, nausea, dizziness, severe HA, changes vision/speech, left arm numbness and tingling and jaw pain.  Cholesterol Currently above goal; working on lifestyle Continue low cholesterol diet and exercise.  Check lipid panel.   Other abnormal glucose Recent A1Cs at goal Discussed diet/exercise, weight management  Defer A1C; check CMP  Overweight with co morbidities Long discussion about weight loss, diet, and exercise Recommended diet heavy in fruits and veggies and low in animal meats, cheeses, and dairy products, appropriate calorie intake Discussed ideal weight for height  Patient will work on maintaining weight through holidays Patient on phentermine with benefit and no SE, taking drug breaks; continue close follow up. Return in 3 months Will follow up in 3 months  Vitamin D Def Below goal at last visit;  continue supplementation to maintain goal of 70-100 Check Vit D level  Depression/anxiety Restart medications, follow up in 3 months or sooner if needed Lifestyle discussed: diet/exerise, sleep hygiene, stress management, hydration  Psoriatic arthritis Continue follow up with Dr. Amil Amen Continue current medications Monitor closely  Continue diet and meds as discussed. Further disposition pending results of labs. Discussed med's effects and SE's.   Over 30 minutes of exam, counseling, chart review, and critical decision making was performed.   Future Appointments  Date Time Provider [place]  02/07/2019  9:30 AM Liane Comber, NP [facility] None    ----------------------------------------------------------------------------------------------------------------------  HPI 62 y.o. female  presents for 3 month follow up on  hypertension, cholesterol, glucose management, weight and vitamin D deficiency. She follows with Dr. Amil Amen for psoriatic arthritis, on MTX and stelara. She also follows with Dr. Sherley Bounds with [facility] for her back, and Dr. Mardelle Matte for her shoulder.  She is on wellbutrin 300 mg daily for recent episode of major depression; unfortunately she ran out of medication 1 month ago and is tearful in the office today. She is starting grief counseling next week regarding her daughter who died 2 years ago. Work has also been very stressful.   she is prescribed phentermine for weight loss. Has not been taking in the last 30 days. While on the medication they have lost 0 lbs since last visit. They deny palpitations, anxiety, trouble sleeping, elevated BP.   BMI is Body mass index is 31.05 kg/m., she is working on diet and exercise. Wt Readings from Last 3 Encounters:  07/25/18 159 lb (72.1 kg)  02/05/18 155 lb 1.6 oz (70.4 kg)  05/07/17 154 lb (69.9 kg)   Her blood pressure has been controlled at home, today their BP is BP: 124/80  She does not workout. She denies chest pain, shortness of breath, dizziness.   She is not on cholesterol medication. Her cholesterol is not at goal. The cholesterol last visit was:   Lab Results  Component Value Date   CHOL 213 (H) 02/05/2018   HDL 58 02/05/2018   LDLCALC 134 (H) 02/05/2018   TRIG 100 02/05/2018   CHOLHDL 3.7 02/05/2018    She has not been working on diet and exercise for glucose management, and denies increased appetite, nausea, paresthesia of the feet, polydipsia, polyuria and visual disturbances. Last A1C in the office was:  Lab Results  Component Value Date   HGBA1C 5.6 02/05/2018   Patient is  on Vitamin D supplement.   Lab Results  Component Value Date   VD25OH 46 02/05/2018        Current Medications:  Current Outpatient Medications on File Prior to Visit  Medication Sig  . acetaminophen (TYLENOL) 500 MG tablet Take by  mouth every 6 (six) hours as needed.  Marland Kitchen buPROPion (WELLBUTRIN XL) 300 MG 24 hr tablet TAKE 1 TABLET BY MOUTH EVERY DAY IN THE MORNING  . folic acid (FOLVITE) 1 MG tablet Take 1 mg by mouth daily.  . hydrocortisone (ANUSOL-HC) 2.5 % rectal cream Place 1 application rectally 2 (two) times daily.  . methotrexate (RHEUMATREX) 10 MG tablet Take 10 mg by mouth. Caution: Chemotherapy. Protect from light. Takes 12 tablets a week.  . phentermine (ADIPEX-P) 37.5 MG tablet Take 1 tablet (37.5 mg total) by mouth daily before breakfast.  . ustekinumab (STELARA) 45 MG/0.5ML SOSY injection Inject 45 mg into the skin. Injects once every quarter  . Vitamin D, Ergocalciferol, (DRISDOL) 50000 units CAPS capsule TAKE ONE CAPSULE BY MOUTH THREE TIMES WEEKLY FOR DEFICIENCY   No current facility-administered medications on file prior to visit.      Allergies: No Known Allergies   Medical History:  Past Medical History:  Diagnosis Date  . Allergy   . Mass of chest wall 12/2012   tender to the touch  . Osteoarthritis   . Seasonal allergies    Family history- Reviewed and unchanged Social history- Reviewed and unchanged   Review of Systems:  Review of Systems  Constitutional: Negative for malaise/fatigue and weight loss.  HENT: Negative for hearing loss and tinnitus.   Eyes: Negative for blurred vision and double vision.  Respiratory: Negative for cough, shortness of breath and wheezing.   Cardiovascular: Negative for chest pain, palpitations, orthopnea, claudication and leg swelling.  Gastrointestinal: Negative for abdominal pain, blood in stool, constipation, diarrhea, heartburn, melena, nausea and vomiting.  Genitourinary: Negative.   Musculoskeletal: Negative for joint pain and myalgias.  Skin: Negative for rash.  Neurological: Negative for dizziness, tingling, sensory change, weakness and headaches.  Endo/Heme/Allergies: Negative for polydipsia.  Psychiatric/Behavioral: Positive for depression.  Negative for hallucinations, substance abuse and suicidal ideas. The patient is not nervous/anxious and does not have insomnia.   All other systems reviewed and are negative.    Physical Exam: BP 124/80   Pulse 68   Temp (!) 97.5 F (36.4 C)   Ht 5' (1.524 m)   Wt 159 lb (72.1 kg)   SpO2 99%   BMI 31.05 kg/m  Wt Readings from Last 3 Encounters:  07/25/18 159 lb (72.1 kg)  02/05/18 155 lb 1.6 oz (70.4 kg)  05/07/17 154 lb (69.9 kg)   General Appearance: Well nourished, in no apparent distress. Eyes: PERRLA, EOMs, conjunctiva no swelling or erythema Sinuses: No Frontal/maxillary tenderness ENT/Mouth: Ext aud canals clear, TMs without erythema, bulging. No erythema, swelling, or exudate on post pharynx.  Tonsils not swollen or erythematous. Hearing normal.  Neck: Supple, thyroid normal.  Respiratory: Respiratory effort normal, BS equal bilaterally without rales, rhonchi, wheezing or stridor.  Cardio: RRR with no MRGs. Brisk peripheral pulses without edema.  Abdomen: Soft, + BS.  Non tender, no guarding, rebound, hernias, masses. Lymphatics: Non tender without lymphadenopathy.  Musculoskeletal: Full ROM, 5/5 strength, Normal gait Skin: Warm, dry without rashes, lesions, ecchymosis.  Neuro: Cranial nerves intact. No cerebellar symptoms.  Psych: Awake and oriented X 3, tearful affect, Insight and Judgment appropriate.    Gabrielle Ribas, NP 8:45 AM  [facility]

## 2018-07-25 ENCOUNTER — Ambulatory Visit: Payer: 59 | Admitting: Adult Health

## 2018-07-25 ENCOUNTER — Encounter: Payer: Self-pay | Admitting: Adult Health

## 2018-07-25 VITALS — BP 124/80 | HR 68 | Temp 97.5°F | Ht 60.0 in | Wt 159.0 lb

## 2018-07-25 DIAGNOSIS — L4050 Arthropathic psoriasis, unspecified: Secondary | ICD-10-CM

## 2018-07-25 DIAGNOSIS — E559 Vitamin D deficiency, unspecified: Secondary | ICD-10-CM | POA: Diagnosis not present

## 2018-07-25 DIAGNOSIS — E663 Overweight: Secondary | ICD-10-CM

## 2018-07-25 DIAGNOSIS — I10 Essential (primary) hypertension: Secondary | ICD-10-CM | POA: Diagnosis not present

## 2018-07-25 DIAGNOSIS — F32A Depression, unspecified: Secondary | ICD-10-CM

## 2018-07-25 DIAGNOSIS — Z79899 Other long term (current) drug therapy: Secondary | ICD-10-CM | POA: Diagnosis not present

## 2018-07-25 DIAGNOSIS — F3341 Major depressive disorder, recurrent, in partial remission: Secondary | ICD-10-CM | POA: Diagnosis not present

## 2018-07-25 DIAGNOSIS — R7309 Other abnormal glucose: Secondary | ICD-10-CM

## 2018-07-25 DIAGNOSIS — E785 Hyperlipidemia, unspecified: Secondary | ICD-10-CM | POA: Diagnosis not present

## 2018-07-25 DIAGNOSIS — F329 Major depressive disorder, single episode, unspecified: Secondary | ICD-10-CM

## 2018-07-25 MED ORDER — BUPROPION HCL ER (XL) 300 MG PO TB24: ORAL_TABLET | ORAL | 1 refills | Status: DC

## 2018-07-25 MED ORDER — PHENTERMINE HCL 37.5 MG PO TABS: 37.5000 mg | ORAL_TABLET | Freq: Every day | ORAL | 2 refills | Status: DC

## 2018-07-25 NOTE — Addendum Note (Signed)
Addended by: Izora Ribas on: 07/25/2018 01:38 PM   Modules accepted: Level of Service

## 2018-07-25 NOTE — Patient Instructions (Signed)
Aim for 7+ servings of fruits and vegetables daily  65-80+ fluid ounces of water or unsweet tea for healthy kidneys  Limit to max 1 drink of alcohol per day; avoid smoking/tobacco  Limit animal fats in diet for cholesterol and heart health - choose grass fed whenever available  Avoid highly processed foods, and foods high in saturated/trans fats  Aim for low stress - take time to unwind and care for your mental health  Aim for 150 min of moderate intensity exercise weekly for heart health, and weights twice weekly for bone health  Aim for 7-9 hours of sleep daily     Complicated Grieving Grief is a normal response to the death of someone close to you. Feelings of fear, anger, and guilt can affect almost everyone who loses a loved one. It is also common to have symptoms of depression while you are grieving. These include problems with sleep, loss of appetite, and lack of energy. They may last for weeks or months after a loss. Complicated grief is different from normal grief or depression. Normal grieving involves sadness and feelings of loss, but these feelings are not constant. Complicated grief is a constant and severe type of grief. It interferes with your ability to function normally. It may last for several months to a year or longer. Complicated grief may require treatment from a mental health care provider. What are the causes? It is not known why some people continue to struggle with grief and others do not. You may be at higher risk for complicated grief if:  The death of your loved one was sudden or unexpected.  The death of your loved one was due to a violent event.  Your loved one committed suicide.  Your loved one was a child or a young person.  You were very close to or dependent on the loved one.  You have a history of depression.  What are the signs or symptoms? Signs and symptoms of complicated grief may include:  Feeling disbelief or numbness.  Being  unable to enjoy good memories of your loved one.  Needing to avoid anything that reminds you of your loved one.  Being unable to stop thinking about the death.  Feeling intense anger or guilt.  Feeling alone and hopeless.  Feeling that your life is meaningless and empty.  Losing the desire to live.  How is this diagnosed? Your health care provider may diagnose complicated grief if:  You have constant symptoms of grief for 6-12 months or longer.  Your symptoms are interfering with your ability to live your life.  Your health care provider may want you to see a mental health care provider. Many symptoms of depression are similar to the symptoms of complicated grief. It is important to be evaluated for complicated grief along with other mental health conditions. How is this treated? Talk therapy with a mental health provider is the most common treatment for complicated grief. During therapy, you will learn healthy ways to cope with the loss of your loved one. In some cases, your mental health care provider may also recommend antidepressant medicines. Follow these instructions at home:  Take care of yourself. ? Eat regular meals and maintain a healthy diet. Eat plenty of fruits, vegetables, and whole grains. ? Try to get some exercise each day. ? Keep regular hours for sleep. Try to get at least 8 hours of sleep each night.  Do not use drugs or alcohol to ease your symptoms.  Take  medicines only as directed by your health care provider.  Spend time with friends and loved ones.  Consider joining a grief (bereavement) support group to help you deal with your loss.  Keep all follow-up visits as directed by your health care provider. This is important. Contact a health care provider if:  Your symptoms keep you from functioning normally.  Your symptoms do not get better with treatment. Get help right away if:  You have serious thoughts of hurting yourself or someone else.  You  have suicidal feelings. This information is not intended to replace advice given to you by your health care provider. Make sure you discuss any questions you have with your health care provider. Document Released: 09/18/2005 Document Revised: 02/24/2016 Document Reviewed: 02/26/2014 Elsevier Interactive Patient Education  [employer].

## 2018-07-26 LAB — CBC WITH DIFFERENTIAL/PLATELET
BASOS ABS: 37 {cells}/uL (ref 0–200)
Basophils Relative: 0.5 %
EOS ABS: 219 {cells}/uL (ref 15–500)
Eosinophils Relative: 3 %
HEMATOCRIT: 37.1 % (ref 35.0–45.0)
Hemoglobin: 12.3 g/dL (ref 11.7–15.5)
Lymphs Abs: 1263 cells/uL (ref 850–3900)
MCH: 30.7 pg (ref 27.0–33.0)
MCHC: 33.2 g/dL (ref 32.0–36.0)
MCV: 92.5 fL (ref 80.0–100.0)
MPV: 11 fL (ref 7.5–12.5)
Monocytes Relative: 5.9 %
NEUTROS PCT: 73.3 %
Neutro Abs: 5351 cells/uL (ref 1500–7800)
Platelets: 254 10*3/uL (ref 140–400)
RBC: 4.01 10*6/uL (ref 3.80–5.10)
RDW: 12.8 % (ref 11.0–15.0)
TOTAL LYMPHOCYTE: 17.3 %
WBC: 7.3 10*3/uL (ref 3.8–10.8)
WBCMIX: 431 {cells}/uL (ref 200–950)

## 2018-07-26 LAB — COMPLETE METABOLIC PANEL WITH GFR
AG RATIO: 2 (calc) (ref 1.0–2.5)
ALKALINE PHOSPHATASE (APISO): 54 U/L (ref 33–130)
ALT: 28 U/L (ref 6–29)
AST: 27 U/L (ref 10–35)
Albumin: 4.3 g/dL (ref 3.6–5.1)
BILIRUBIN TOTAL: 0.4 mg/dL (ref 0.2–1.2)
BUN: 12 mg/dL (ref 7–25)
CO2: 23 mmol/L (ref 20–32)
Calcium: 9.2 mg/dL (ref 8.6–10.4)
Chloride: 108 mmol/L (ref 98–110)
Creat: 0.8 mg/dL (ref 0.50–0.99)
GFR, Est African American: 90 mL/min/{1.73_m2} (ref >=60)
GFR, Est Non African American: 78 mL/min/{1.73_m2} (ref >=60)
GLOBULIN: 2.1 g/dL (ref 1.9–3.7)
Glucose, Bld: 93 mg/dL (ref 65–99)
POTASSIUM: 4.5 mmol/L (ref 3.5–5.3)
SODIUM: 139 mmol/L (ref 135–146)
Total Protein: 6.4 g/dL (ref 6.1–8.1)

## 2018-07-26 LAB — LIPID PANEL
CHOLESTEROL: 192 mg/dL (ref <200)
HDL: 59 mg/dL (ref >50)
LDL Cholesterol (Calc): 113 mg/dL (calc) — ABNORMAL HIGH
Non-HDL Cholesterol (Calc): 133 mg/dL (calc) — ABNORMAL HIGH (ref <130)
Total CHOL/HDL Ratio: 3.3 (calc) (ref <5.0)
Triglycerides: 98 mg/dL (ref <150)

## 2018-07-26 LAB — TSH: TSH: 2 mIU/L (ref 0.40–4.50)

## 2018-07-26 LAB — VITAMIN D 25 HYDROXY (VIT D DEFICIENCY, FRACTURES): Vit D, 25-Hydroxy: 41 ng/mL (ref 30–100)

## 2018-08-01 DIAGNOSIS — E559 Vitamin D deficiency, unspecified: Secondary | ICD-10-CM | POA: Diagnosis not present

## 2018-08-01 DIAGNOSIS — L4050 Arthropathic psoriasis, unspecified: Secondary | ICD-10-CM | POA: Diagnosis not present

## 2018-08-01 DIAGNOSIS — M150 Primary generalized (osteo)arthritis: Secondary | ICD-10-CM | POA: Diagnosis not present

## 2018-09-10 ENCOUNTER — Other Ambulatory Visit: Payer: Self-pay | Admitting: Physician Assistant

## 2018-09-10 DIAGNOSIS — K649 Unspecified hemorrhoids: Secondary | ICD-10-CM

## 2018-11-03 ENCOUNTER — Other Ambulatory Visit: Payer: Self-pay | Admitting: Adult Health

## 2018-11-03 DIAGNOSIS — E559 Vitamin D deficiency, unspecified: Secondary | ICD-10-CM

## 2018-11-05 ENCOUNTER — Ambulatory Visit: Payer: Self-pay | Admitting: Adult Health Nurse Practitioner

## 2018-11-05 ENCOUNTER — Ambulatory Visit: Payer: Self-pay | Admitting: Adult Health

## 2018-11-18 NOTE — Progress Notes (Deleted)
3 Month Follow Up   Assessment and Plan:   Hypertension Well controlled with current medications  Monitor blood pressure at home; patient to call if consistently greater than 140/90 Continue DASH diet.   Reminder to go to the ER if any CP, SOB, nausea, dizziness, severe HA, changes vision/speech, left arm numbness and tingling and jaw pain.  Cholesterol Continue current medications Currently at goal;  Continue low cholesterol diet and exercise.  Check lipid panel.   Diabetes {with or without complications:30421263} Continue current medications Discussed dietary and exercise modifications Perform daily foot/skin check, notify office of any concerning changes.  Will check A1C  Obesity with co morbidities Long discussion about weight loss, diet, and exercise Recommended diet heavy in fruits and veggies and low in animal meats, cheeses, and dairy products, appropriate calorie intake Discussed ideal weight for height (below ***) and initial weight goal (***) Patient will work on *** Will follow up in 3 months  Vitamin D Def At goal at last visit;  Continue supplementation  of Vitamin D     IU to maintain goal of 70-100 Will check Vit D level  Hypothyroidism Asymptomatic Continue current medications Will check TSH Reminder to take on an empty stomach 30-68mins before first meal of the day.  Continue diet and meds as discussed. Further disposition pending results of labs. Discussed med's effects and SE's.   Over 30 minutes of exam, counseling, chart review, and critical decision making was performed.   Future Appointments  Date Time Provider [place]  11/19/2018  8:45 AM Garnet Sierras, NP [facility] None  02/07/2019  9:30 AM Liane Comber, NP [facility] None    ----------------------------------------------------------------------------------------------------------------------  HPI 63 y.o. female  presents for 3 month follow up on HTN, HLD, Hypothyroidism,  history of pre-diabetes, weight and vitamin D deficiency.   BMI is There is no height or weight on file to calculate BMI., she {HAS HAS QVZ:56387} been working on diet and exercise. Wt Readings from Last 3 Encounters:  07/25/18 159 lb (72.1 kg)  02/05/18 155 lb 1.6 oz (70.4 kg)  05/07/17 154 lb (69.9 kg)    HTN predates *** Her blood pressure {HAS HAS NOT:18834} been controlled at home, today their BP is    She {DOES_DOES FIE:33295} workout. She denies any cardiac symptoms, chest pains, palpitations, shortness of breath, dizziness or lower extremity edema.     She {ACTION; IS/IS JOA:41660630} on cholesterol medication {Cholesterol meds:21887} and denies myalgias. Her cholesterol {ACTION; IS/IS NOT:21021397} at goal. The cholesterol last visit was:   Lab Results  Component Value Date   CHOL 192 07/25/2018   HDL 59 07/25/2018   LDLCALC 113 (H) 07/25/2018   TRIG 98 07/25/2018   CHOLHDL 3.3 07/25/2018    She {Has/has not:18111} been working on diet and exercise for prediabetes, and denies {Symptoms; diabetes w/o none:19199}. Last A1C in the office was:  Lab Results  Component Value Date   HGBA1C 5.6 02/05/2018   Patient is on Vitamin D supplement.   Lab Results  Component Value Date   VD25OH 41 07/25/2018       Current Medications:  Current Outpatient Medications on File Prior to Visit  Medication Sig  . acetaminophen (TYLENOL) 500 MG tablet Take by mouth every 6 (six) hours as needed.  Marland Kitchen buPROPion (WELLBUTRIN XL) 300 MG 24 hr tablet TAKE 1 TABLET BY MOUTH EVERY DAY IN THE MORNING  . folic acid (FOLVITE) 1 MG tablet Take 1 mg by mouth daily.  . methotrexate (RHEUMATREX) 10  MG tablet Take 10 mg by mouth. Caution: Chemotherapy. Protect from light. Takes 12 tablets a week.  . phentermine (ADIPEX-P) 37.5 MG tablet Take 1 tablet (37.5 mg total) by mouth daily before breakfast.  . PROCTOSOL HC 2.5 % rectal cream PLACE 1 APPLICATION RECTALLY 2 (TWO) TIMES DAILY.  Marland Kitchen ustekinumab  (STELARA) 45 MG/0.5ML SOSY injection Inject 45 mg into the skin. Injects once every quarter  . Vitamin D, Ergocalciferol, (DRISDOL) 1.25 MG (50000 UT) CAPS capsule TAKE ONE CAPSULE BY MOUTH THREE TIMES WEEKLY FOR DEFICIENCY   No current facility-administered medications on file prior to visit.     Allergies:  No Known Allergies   Medical History:  Past Medical History:  Diagnosis Date  . Allergy   . Mass of chest wall 12/2012   tender to the touch  . Osteoarthritis   . Seasonal allergies     Family history- Reviewed and unchanged   Social history- Reviewed and unchanged   Names of Other Physician/Practitioners you currently use: 1. [facility] here for primary care 2. ***, eye doctor, last visit *** 3. ***, dentist, last visit *** Patient Care Team: Unk Pinto, MD as PCP - General (Internal Medicine) Wonda Horner, MD as Consulting Physician (Gastroenterology) Hennie Duos, MD as Consulting Physician (Rheumatology) Marchia Bond, MD as Consulting Physician (Orthopedic Surgery) Elberta Spaniel, MD as Referring Physician (Neurology)   Screening Tests: Immunization History  Administered Date(s) Administered  . PPD Test 05/07/2017  . Tdap 12/09/2015     Vaccinations: TD or Tdap: 2017  Influenza: DUE? Pneumococcal: *** Prevnar13:  Shingles/Zostavax: **   Preventative Care: Last colonoscopy: 2014, due 2024 Last mammogram: 2019 Last pap smear/pelvic exam: 2017, DUE DEXA:*** HIV screening: 2018 nonreactive Hep C: 2018 Negative  Imaging: Chest X-ray: 2013 EKG: 2019 Thyroid biopsy, left nodule 2014 CT Neck 2014  Review of Systems:  ROS    Physical Exam: There were no vitals taken for this visit. Wt Readings from Last 3 Encounters:  07/25/18 159 lb (72.1 kg)  02/05/18 155 lb 1.6 oz (70.4 kg)  05/07/17 154 lb (69.9 kg)   General Appearance: Well nourished, in no apparent distress. Eyes: PERRLA, EOMs,  conjunctiva no swelling or erythema Sinuses: No Frontal/maxillary tenderness ENT/Mouth: Ext aud canals clear, TMs without erythema, bulging. No erythema, swelling, or exudate on post pharynx.  Tonsils not swollen or erythematous. Hearing normal.  Neck: Supple, thyroid normal.  Respiratory: Respiratory effort normal, BS equal bilaterally without rales, rhonchi, wheezing or stridor.  Cardio: RRR with no MRGs. Brisk peripheral pulses without edema.  Abdomen: Soft, + BS.  Non tender, no guarding, rebound, hernias, masses. Lymphatics: Non tender without lymphadenopathy.  Musculoskeletal: Full ROM, 5/5 strength, {PSY - GAIT AND STATION:22860} gait Skin: Warm, dry without rashes, lesions, ecchymosis.  Neuro: Cranial nerves intact. No cerebellar symptoms.  Psych: Awake and oriented X 3, normal affect, Insight and Judgment appropriate.    Garnet Sierras, NP [facility] 7:05 AM

## 2018-11-19 ENCOUNTER — Ambulatory Visit: Payer: Self-pay | Admitting: Adult Health Nurse Practitioner

## 2018-12-03 ENCOUNTER — Encounter: Payer: Self-pay | Admitting: Adult Health Nurse Practitioner

## 2018-12-03 ENCOUNTER — Ambulatory Visit: Payer: Managed Care, Other (non HMO) | Admitting: Adult Health Nurse Practitioner

## 2018-12-03 VITALS — BP 114/76 | HR 66 | Temp 97.4°F | Ht 60.0 in | Wt 155.0 lb

## 2018-12-03 DIAGNOSIS — K5900 Constipation, unspecified: Secondary | ICD-10-CM

## 2018-12-03 DIAGNOSIS — E785 Hyperlipidemia, unspecified: Secondary | ICD-10-CM

## 2018-12-03 DIAGNOSIS — L4050 Arthropathic psoriasis, unspecified: Secondary | ICD-10-CM | POA: Diagnosis not present

## 2018-12-03 DIAGNOSIS — K649 Unspecified hemorrhoids: Secondary | ICD-10-CM

## 2018-12-03 DIAGNOSIS — I10 Essential (primary) hypertension: Secondary | ICD-10-CM

## 2018-12-03 DIAGNOSIS — Z6830 Body mass index (BMI) 30.0-30.9, adult: Secondary | ICD-10-CM

## 2018-12-03 DIAGNOSIS — K921 Melena: Secondary | ICD-10-CM

## 2018-12-03 DIAGNOSIS — Z79899 Other long term (current) drug therapy: Secondary | ICD-10-CM | POA: Diagnosis not present

## 2018-12-03 DIAGNOSIS — F3341 Major depressive disorder, recurrent, in partial remission: Secondary | ICD-10-CM | POA: Diagnosis not present

## 2018-12-03 DIAGNOSIS — E041 Nontoxic single thyroid nodule: Secondary | ICD-10-CM

## 2018-12-03 DIAGNOSIS — E663 Overweight: Secondary | ICD-10-CM

## 2018-12-03 DIAGNOSIS — E559 Vitamin D deficiency, unspecified: Secondary | ICD-10-CM | POA: Diagnosis not present

## 2018-12-03 DIAGNOSIS — R7309 Other abnormal glucose: Secondary | ICD-10-CM | POA: Diagnosis not present

## 2018-12-03 DIAGNOSIS — R195 Other fecal abnormalities: Secondary | ICD-10-CM

## 2018-12-03 MED ORDER — PHENTERMINE HCL 37.5 MG PO TABS: 37.5000 mg | ORAL_TABLET | Freq: Every day | ORAL | 2 refills | Status: DC

## 2018-12-03 NOTE — Progress Notes (Addendum)
3 Month Follow Up  Gabrielle Duncan was seen today for follow-up.  Diagnoses and all orders for this visit:  Essential hypertension Well controlled with current medications  Monitor blood pressure at home; patient to call if consistently greater than 140/90 Continue DASH diet.   Reminder to go to the ER if any CP, SOB, nausea, dizziness, severe HA, changes vision/speech, left arm numbness and tingling and jaw pain. -     Magnesium  Hyperlipademia Not at goal;  Continue low cholesterol diet and exercise.  Check lipid panel.   Recurrent major depressive disorder, in partial remission ([place]) Doing well at this time Going to grief counseling, continue this with benefit Continue Wellbutrin XL 300mg  daily with benefit  Psoriatic arthritis ([place]) Taking MTX and Stelara (2wk behind, from [place]) Continue follow up with Rheumatology.  Hemorrhoids, unspecified hemorrhoid type -     CBC with Differential/Platelet -     POCT occult blood stool Continue to monitor.  No external at this time, history of internal.  Change in consistency of stool -     POCT occult blood stool Referral for GI, pending occult  Constipation Discussed increase water intake Try daily Probiotic OTC colace daily  Hematochezia Reported by patient Will do GI referral internal hemorrhoids in past Concerning with stool consistency change and progressive duration of 3-4 months. Continue to monitor Discussed hospital precautions  Vitamin D deficiency -     VITAMIN D 25 Hydroxy (Vit-D Deficiency) At goal at last visit;  Continue supplementation  of Vitamin D 50,000 IU weekly to maintain goal of 70-100 Will check Vit D level, not to goal last check.  BMI 30.0-30.9,adult Discussed dietary and exercise modifications Phentermine 37.5mg , taking half tablet at this time daily, take in morning. Discussed dietary and exercise modifications -     Hemoglobin A1c -     Insulin, random  Left thyroid nodule -     TSH Last  ultrasound 2014, biopsy negative Due for ultrasound  Abnormal glucose -     Hemoglobin A1c -     Insulin, random  Medication management -     Magnesium -     Lipid panel -     TSH -     Hemoglobin A1c -     VITAMIN D 25 Hydroxy (Vit-D Deficiency, Fractures) -     Insulin, random  Discussed hospital precautions with patient and agrees with plan of care.  We will contact you in 1-3 business days with lab results drawn today.  Patient prefers MyChart notification.  Follow up in 3 month(s) for chronic condition management.  Call or return with new or worsening symptoms as discussed in appointment.  May contact office via phone [phone] or [place].   Continue diet and meds as discussed. Further disposition pending results of labs. Discussed med's effects and SE's.   Over 30 minutes of exam, counseling, chart review, and critical decision making was performed.   Future Appointments  Date Time Provider [place]  12/03/2018  8:45 AM Garnet Sierras, NP [facility] None  02/07/2019  9:30 AM Liane Comber, NP [facility] None    ----------------------------------------------------------------------------------------------------------------------  HPI 63 y.o. female   3 month follow up on hypertension, cholesterol, glucose management, weight and vitamin D deficiency. She follows with Dr. Amil Amen for psoriatic arthritis, on MTX and stelara.  Last appointment was 3 week ago and had a CBC and CMP that was WNL. She is on wellbutrin 300 mg daily for recent episode of major depression.  She is  starting grief counseling regarding her daughter who died 2 years ago.  She reports this counseling is going well and it has helped. Work has also been very stressful.   In the past 3-4 months she has noticed a complete change in bowels.  She has had an increase in bringht red blood in the toilet after bowel movement and very painful.  Stool is dark brown and reports she is now severely  constipated.  She denies any external hemmhroids.  She is taking a probiotic and yogurt.  Reports that she has.  Basal cell carcinoma to left nose and follow with dermatologist.  She is using cream for this on the left side on her nose.  She is scheduling a follow up as this is growing again.    She is prescribed phentermine for weight loss. Has not been taking in the last 30 days. She has lost 6lbs in the post few months.  She would like to start taking this again, she was taking half a tablet.  They deny palpitations, anxiety, trouble sleeping, elevated BP.  BMI is There is no height or weight on file to calculate BMI., she has been working on diet and exercise. Wt Readings from Last 3 Encounters:  07/25/18 159 lb (72.1 kg)  02/05/18 155 lb 1.6 oz (70.4 kg)  05/07/17 154 lb (69.9 kg)     Her blood pressure has been controlled at home, today their BP is    She does workout. She denies any cardiac symptoms, chest pains, palpitations, shortness of breath, dizziness or lower extremity edema.     She is not on cholesterol medication. Her cholesterol is not at goal. The cholesterol last visit was:   Lab Results  Component Value Date   CHOL 192 07/25/2018   HDL 59 07/25/2018   LDLCALC 113 (H) 07/25/2018   TRIG 98 07/25/2018   CHOLHDL 3.3 07/25/2018    She has been working on diet and exercise for prediabetes, and denies hyperglycemia, hypoglycemia , increased appetite, nausea, polydipsia and polyuria. Last A1C in the office was:  Lab Results  Component Value Date   HGBA1C 5.6 02/05/2018   Patient is on Vitamin D supplement.   Lab Results  Component Value Date   VD25OH 41 07/25/2018       Current Medications:  Current Outpatient Medications on File Prior to Visit  Medication Sig  . acetaminophen (TYLENOL) 500 MG tablet Take by mouth every 6 (six) hours as needed.  Marland Kitchen buPROPion (WELLBUTRIN XL) 300 MG 24 hr tablet TAKE 1 TABLET BY MOUTH EVERY DAY IN THE MORNING  . folic acid (FOLVITE)  1 MG tablet Take 1 mg by mouth daily.  . methotrexate (RHEUMATREX) 10 MG tablet Take 10 mg by mouth. Caution: Chemotherapy. Protect from light. Takes 12 tablets a week.  . phentermine (ADIPEX-P) 37.5 MG tablet Take 1 tablet (37.5 mg total) by mouth daily before breakfast.  . PROCTOSOL HC 2.5 % rectal cream PLACE 1 APPLICATION RECTALLY 2 (TWO) TIMES DAILY.  Marland Kitchen ustekinumab (STELARA) 45 MG/0.5ML SOSY injection Inject 45 mg into the skin. Injects once every quarter  . Vitamin D, Ergocalciferol, (DRISDOL) 1.25 MG (50000 UT) CAPS capsule TAKE ONE CAPSULE BY MOUTH THREE TIMES WEEKLY FOR DEFICIENCY   No current facility-administered medications on file prior to visit.     Allergies:  No Known Allergies   Medical History:  Past Medical History:  Diagnosis Date  . Allergy   . Mass of chest wall 12/2012   tender  to the touch  . Osteoarthritis   . Seasonal allergies     Family history- Reviewed and unchanged   Social history- Reviewed and unchanged   Names of Other Physician/Practitioners you currently use: 1. [facility] here for primary care 2. Eye Exam Due for 2020 3.Dentist Due for 2020 Patient Care Team: Unk Pinto, MD as PCP - General (Internal Medicine) Wonda Horner, MD as Consulting Physician (Gastroenterology) Hennie Duos, MD as Consulting Physician (Rheumatology) Marchia Bond, MD as Consulting Physician (Orthopedic Surgery) Elberta Spaniel, MD as Referring Physician (Neurology)   Screening Tests: Immunization History  Administered Date(s) Administered  . PPD Test 05/07/2017  . Tdap 12/09/2015     Vaccinations: TD or Tdap: 2018  Influenza:   Pneumococcal: N/A Prevnar13: N/A Shingles/Zostavax: N/A related to current medications.   Preventative Care: Last colonoscopy: 2014, Due 2024 Last mammogram: 2019 Last pap smear/pelvic exam: 2017 DEXA: Due in 2 months  Imaging: Chest X-ray: 2013 EKG: 2019 ECHO:  N/A    Review of Systems:  ROS    Physical Exam: There were no vitals taken for this visit. Wt Readings from Last 3 Encounters:  07/25/18 159 lb (72.1 kg)  02/05/18 155 lb 1.6 oz (70.4 kg)  05/07/17 154 lb (69.9 kg)   General Appearance: Well nourished, in no apparent distress. Eyes: PERRLA, EOMs, conjunctiva no swelling or erythema Sinuses: No Frontal/maxillary tenderness ENT/Mouth: Ext aud canals clear, TMs without erythema, bulging. No erythema, swelling, or exudate on post pharynx.  Tonsils not swollen or erythematous. Hearing normal.  Neck: Supple, thyroid normal.  Respiratory: Respiratory effort normal, BS equal bilaterally without rales, rhonchi, wheezing or stridor.  Cardio: RRR with no MRGs. Brisk peripheral pulses without edema.  Abdomen: Soft, + BS.  Non tender, no guarding, rebound, hernias, masses. Lymphatics: Non tender without lymphadenopathy.  Musculoskeletal: Full ROM, 5/5 strength,normal gait Skin: Warm, dry without rashes, lesions, ecchymosis.  Neuro: Cranial nerves intact. No cerebellar symptoms.  Psych: Awake and oriented X 3, normal affect, Insight and Judgment appropriate.    Garnet Sierras, NP [facility] 12:13 AM

## 2018-12-03 NOTE — Patient Instructions (Addendum)
We will contact you via MyChart with your labs results.  We have sent [facility] stool cards home with you today, they contain to specific instructions.  We will do a referral for GI, related to your increasing symptoms  Please seek immediate attention if you have severe abdominal pain, fever, shortness of breath or chest pains.   Continue taking probiotic and eating yogurt Try taking Colace daily, this is a stool softener daily Increase your water intake at least 80oz of water daily You can try Miralax if colace is not helping  Please contact our office with new or worsening symptoms.   Preventive Care for Adults  A healthy lifestyle and preventive care can promote health and wellness. Preventive health guidelines for women include the following key practices.  A routine yearly physical is a good way to check with your health care provider about your health and preventive screening. It is a chance to share any concerns and updates on your health and to receive a thorough exam.  Visit your dentist for a routine exam and preventive care every 6 months. Brush your teeth twice a day and floss once a day. Good oral hygiene prevents tooth decay and gum disease.  The frequency of eye exams is based on your age, health, family medical history, use of contact lenses, and other factors. Follow your health care provider's recommendations for frequency of eye exams.  Eat a healthy diet. Foods like vegetables, fruits, whole grains, low-fat dairy products, and lean protein foods contain the nutrients you need without too many calories. Decrease your intake of foods high in solid fats, added sugars, and salt. Eat the right amount of calories for you. Get information about a proper diet from your health care provider, if necessary.  Regular physical exercise is one of the most important things you can do for your health. Most adults should get at least 150 minutes of moderate-intensity exercise (any  activity that increases your heart rate and causes you to sweat) each week. In addition, most adults need muscle-strengthening exercises on 2 or more days a week.  Maintain a healthy weight. The body mass index (BMI) is a screening tool to identify possible weight problems. It provides an estimate of body fat based on height and weight. Your health care provider can find your BMI and can help you achieve or maintain a healthy weight. For adults 20 years and older:  A BMI below 18.5 is considered underweight.  A BMI of 18.5 to 24.9 is normal.  A BMI of 25 to 29.9 is considered overweight.  A BMI of 30 and above is considered obese.  Maintain normal blood lipids and cholesterol levels by exercising and minimizing your intake of saturated fat. Eat a balanced diet with plenty of fruit and vegetables. Blood tests for lipids and cholesterol should begin at age 5 and be repeated every 5 years. If your lipid or cholesterol levels are high, you are over 50, or you are at high risk for heart disease, you may need your cholesterol levels checked more frequently. Ongoing high lipid and cholesterol levels should be treated with medicines if diet and exercise are not working.  If you smoke, find out from your health care provider how to quit. If you do not use tobacco, do not start.  Lung cancer screening is recommended for adults aged 45-80 years who are at high risk for developing lung cancer because of a history of smoking. A yearly low-dose CT scan of the lungs is  recommended for people who have at least a 30-pack-year history of smoking and are a current smoker or have quit within the past 15 years. A pack year of smoking is smoking an average of 1 pack of cigarettes a day for 1 year (for example: 1 pack a day for 30 years or 2 packs a day for 15 years). Yearly screening should continue until the smoker has stopped smoking for at least 15 years. Yearly screening should be stopped for people who develop a  health problem that would prevent them from having lung cancer treatment.  High blood pressure causes heart disease and increases the risk of stroke. Your blood pressure should be checked at least every 1 to 2 years. Ongoing high blood pressure should be treated with medicines if weight loss and exercise do not work.  If you are 73-29 years old, ask your health care provider if you should take aspirin to prevent strokes.  Diabetes screening involves taking a blood sample to check your fasting blood sugar level. This should be done once every 3 years, after age 23, if you are within normal weight and without risk factors for diabetes. Testing should be considered at a younger age or be carried out more frequently if you are overweight and have at least 1 risk factor for diabetes.  Breast cancer screening is essential preventive care for women. You should practice "breast self-awareness." This means understanding the normal appearance and feel of your breasts and may include breast self-examination. Any changes detected, no matter how small, should be reported to a health care provider. Women in their 77s and 30s should have a clinical breast exam (CBE) by a health care provider as part of a regular health exam every 1 to 3 years. After age 28, women should have a CBE every year. Starting at age 36, women should consider having a mammogram (breast X-ray test) every year. Women who have a family history of breast cancer should talk to their health care provider about genetic screening. Women at a high risk of breast cancer should talk to their health care providers about having an MRI and a mammogram every year.  Breast cancer gene (BRCA)-related cancer risk assessment is recommended for women who have family members with BRCA-related cancers. BRCA-related cancers include breast, ovarian, tubal, and peritoneal cancers. Having family members with these cancers may be associated with an increased risk for  harmful changes (mutations) in the breast cancer genes BRCA1 and BRCA2. Results of the assessment will determine the need for genetic counseling and BRCA1 and BRCA2 testing.  Routine pelvic exams to screen for cancer are no longer recommended for nonpregnant women who are considered low risk for cancer of the pelvic organs (ovaries, uterus, and vagina) and who do not have symptoms. Ask your health care provider if a screening pelvic exam is right for you.  If you have had past treatment for cervical cancer or a condition that could lead to cancer, you need Pap tests and screening for cancer for at least 20 years after your treatment. If Pap tests have been discontinued, your risk factors (such as having a new sexual partner) need to be reassessed to determine if screening should be resumed. Some women have medical problems that increase the chance of getting cervical cancer. In these cases, your health care provider may recommend more frequent screening and Pap tests.  Colorectal cancer can be detected and often prevented. Most routine colorectal cancer screening begins at the age of 13 years  and continues through age 30 years. However, your health care provider may recommend screening at an earlier age if you have risk factors for colon cancer. On a yearly basis, your health care provider may provide home test kits to check for hidden blood in the stool. Use of a small camera at the end of a tube, to directly examine the colon (sigmoidoscopy or colonoscopy), can detect the earliest forms of colorectal cancer. Talk to your health care provider about this at age 32, when routine screening begins.  Direct exam of the colon should be repeated every 5-10 years through age 62 years, unless early forms of pre-cancerous polyps or small growths are found.  Hepatitis C blood testing is recommended for all people born from [birth year] through 1965 and any individual with known risks for hepatitis C.  Pra  Osteoporosis is  a disease in which the bones lose minerals and strength with aging. This can result in serious bone fractures or breaks. The risk of osteoporosis can be identified using a bone density scan. Women ages 66 years and over and women at risk for fractures or osteoporosis should discuss screening with their health care providers. Ask your health care provider whether you should take a calcium supplement or vitamin D to reduce the rate of osteoporosis.  Menopause can be associated with physical symptoms and risks. Hormone replacement therapy is available to decrease symptoms and risks. You should talk to your health care provider about whether hormone replacement therapy is right for you.  Use sunscreen. Apply sunscreen liberally and repeatedly throughout the day. You should seek shade when your shadow is shorter than you. Protect yourself by wearing long sleeves, pants, a wide-brimmed hat, and sunglasses year round, whenever you are outdoors.  Once a month, do a whole body skin exam, using a mirror to look at the skin on your back. Tell your health care provider of new moles, moles that have irregular borders, moles that are larger than a pencil eraser, or moles that have changed in shape or color.  Stay current with required vaccines (immunizations).  Influenza vaccine. All adults should be immunized every year.  Tetanus, diphtheria, and acellular pertussis (Td, Tdap) vaccine. Pregnant women should receive 1 dose of Tdap vaccine during each pregnancy. The dose should be obtained regardless of the length of time since the last dose. Immunization is preferred during the 27th-36th week of gestation. An adult who has not previously received Tdap or who does not know her vaccine status should receive 1 dose of Tdap. This initial dose should be followed by tetanus and diphtheria toxoids (Td) booster doses every 10 years. Adults with an unknown or incomplete history of completing a 3-dose immunization series with  Td-containing vaccines should begin or complete a primary immunization series including a Tdap dose. Adults should receive a Td booster every 10 years.  Varicella vaccine. An adult without evidence of immunity to varicella should receive 2 doses or a second dose if she has previously received 1 dose. Pregnant females who do not have evidence of immunity should receive the first dose after pregnancy. This first dose should be obtained before leaving the health care facility. The second dose should be obtained 4-8 weeks after the first dose.  Human papillomavirus (HPV) vaccine. Females aged 13-26 years who have not received the vaccine previously should obtain the 3-dose series. The vaccine is not recommended for use in pregnant females. However, pregnancy testing is not needed before receiving a dose. If a female is  found to be pregnant after receiving a dose, no treatment is needed. In that case, the remaining doses should be delayed until after the pregnancy. Immunization is recommended for any person with an immunocompromised condition through the age of 14 years if she did not get any or all doses earlier. During the 3-dose series, the second dose should be obtained 4-8 weeks after the first dose. The third dose should be obtained 24 weeks after the first dose and 16 weeks after the second dose.  Zoster vaccine. One dose is recommended for adults aged 67 years or older unless certain conditions are present.  Measles, mumps, and rubella (MMR) vaccine. Adults born before 55 generally are considered immune to measles and mumps. Adults born in [birth year] or later should have 1 or more doses of MMR vaccine unless there is a contraindication to the vaccine or there is laboratory evidence of immunity to each of the three diseases. A routine second dose of MMR vaccine should be obtained at least 28 days after the first dose for students attending postsecondary schools, health care workers, or international travelers.  People who received inactivated measles vaccine or an unknown type of measles vaccine during 1963-1967 should receive 2 doses of MMR vaccine. People who received inactivated mumps vaccine or an unknown type of mumps vaccine before 1979 and are at high risk for mumps infection should consider immunization with 2 doses of MMR vaccine. For females of childbearing age, rubella immunity should be determined. If there is no evidence of immunity, females who are not pregnant should be vaccinated. If there is no evidence of immunity, females who are pregnant should delay immunization until after pregnancy. Unvaccinated health care workers born before [birth year] who lack laboratory evidence of measles, mumps, or rubella immunity or laboratory confirmation of disease should consider measles and mumps immunization with 2 doses of MMR vaccine or rubella immunization with 1 dose of MMR vaccine.  Pneumococcal 13-valent conjugate (PCV13) vaccine. When indicated, a person who is uncertain of her immunization history and has no record of immunization should receive the PCV13 vaccine. An adult aged 51 years or older who has certain medical conditions and has not been previously immunized should receive 1 dose of PCV13 vaccine. This PCV13 should be followed with a dose of pneumococcal polysaccharide (PPSV23) vaccine. The PPSV23 vaccine dose should be obtained at least 1 or more year(s) after the dose of PCV13 vaccine. An adult aged 44 years or older who has certain medical conditions and previously received 1 or more doses of PPSV23 vaccine should receive 1 dose of PCV13. The PCV13 vaccine dose should be obtained 1 or more years after the last PPSV23 vaccine dose.    Pneumococcal polysaccharide (PPSV23) vaccine. When PCV13 is also indicated, PCV13 should be obtained first. All adults aged 85 years and older should be immunized. An adult younger than age 71 years who has certain medical conditions should be immunized. Any person who  resides in a nursing home or long-term care facility should be immunized. An adult smoker should be immunized. People with an immunocompromised condition and certain other conditions should receive both PCV13 and PPSV23 vaccines. People with human immunodeficiency virus (HIV) infection should be immunized as soon as possible after diagnosis. Immunization during chemotherapy or radiation therapy should be avoided. Routine use of PPSV23 vaccine is not recommended for American Indians, [place] Natives, or people younger than 65 years unless there are medical conditions that require PPSV23 vaccine. When indicated, people who have unknown immunization and  have no record of immunization should receive PPSV23 vaccine. One-time revaccination 5 years after the first dose of PPSV23 is recommended for people aged 19-64 years who have chronic kidney failure, nephrotic syndrome, asplenia, or immunocompromised conditions. People who received 1-2 doses of PPSV23 before age 60 years should receive another dose of PPSV23 vaccine at age 58 years or later if at least 5 years have passed since the previous dose. Doses of PPSV23 are not needed for people immunized with PPSV23 at or after age 83 years.  Preventive Services / Frequency   Ages 37 to 6 years  Blood pressure check.  Lipid and cholesterol check.  Lung cancer screening. / Every year if you are aged 64-80 years and have a 30-pack-year history of smoking and currently smoke or have quit within the past 15 years. Yearly screening is stopped once you have quit smoking for at least 15 years or develop a health problem that would prevent you from having lung cancer treatment.  Clinical breast exam.** / Every year after age 82 years.   BRCA-related cancer risk assessment.** / For women who have family members with a BRCA-related cancer (breast, ovarian, tubal, or peritoneal cancers).  Mammogram.** / Every year beginning at age 63 years and continuing for as long as  you are in good health. Consult with your health care provider.  Pap test.** / Every 3 years starting at age 45 years through age 21 or 44 years with a history of 3 consecutive normal Pap tests.  HPV screening.** / Every 3 years from ages 30 years through ages 77 to 21 years with a history of 3 consecutive normal Pap tests.  Fecal occult blood test (FOBT) of stool. / Every year beginning at age 21 years and continuing until age 50 years. You may not need to do this test if you get a colonoscopy every 10 years.  Flexible sigmoidoscopy or colonoscopy.** / Every 5 years for a flexible sigmoidoscopy or every 10 years for a colonoscopy beginning at age 22 years and continuing until age 42 years.  Hepatitis C blood test.** / For all people born from [birth year] through 1965 and any individual with known risks for hepatitis C.  Skin self-exam. / Monthly.  Influenza vaccine. / Every year.  Tetanus, diphtheria, and acellular pertussis (Tdap/Td) vaccine.** / Consult your health care provider. Pregnant women should receive 1 dose of Tdap vaccine during each pregnancy. 1 dose of Td every 10 years.  Varicella vaccine.** / Consult your health care provider. Pregnant females who do not have evidence of immunity should receive the first dose after pregnancy.  Zoster vaccine.** / 1 dose for adults aged 39 years or older.  Pneumococcal 13-valent conjugate (PCV13) vaccine.** / Consult your health care provider.  Pneumococcal polysaccharide (PPSV23) vaccine.** / 1 to 2 doses if you smoke cigarettes or if you have certain conditions.  Meningococcal vaccine.** / Consult your health care provider.  Hepatitis A vaccine.** / Consult your health care provider.  Hepatitis B vaccine.** / Consult your health care provider. Screening for abdominal aortic aneurysm (AAA)  by ultrasound is recommended for people over 50 who have history of high blood pressure or who are current or former  smokers. ++++++++++++++++++ Recommend Adult Low Dose Aspirin or  coated  Aspirin 81 mg daily  To reduce risk of Colon Cancer 20 %,  Skin Cancer 26 % ,  Melanoma 46%  and  Pancreatic cancer 60% +++++++++++++++++++ Vitamin D goal  is between 70-100.  Please make sure that  you are taking your Vitamin D as directed.  It is very important as a natural anti-inflammatory  helping hair, skin, and nails, as well as reducing stroke and heart attack risk.  It helps your bones and helps with mood. It also decreases numerous cancer risks so please take it as directed.  Low Vit D is associated with a 200-300% higher risk for CANCER  and 200-300% higher risk for HEART   ATTACK  &  STROKE.   .....................................Marland Kitchen It is also associated with higher death rate at younger ages,  autoimmune diseases like Rheumatoid arthritis, Lupus, Multiple Sclerosis.    Also many other serious conditions, like depression, Alzheimer's Dementia, infertility, muscle aches, fatigue, fibromyalgia - just to name a few. ++++++++++++++++++ Recommend the book "The END of DIETING" by Dr Excell Seltzer  & the book "The END of DIABETES " by Dr Excell Seltzer At [email or web address] - get book & Audio CD's    Being diabetic has a  300% increased risk for heart attack, stroke, cancer, and alzheimer- type vascular dementia. It is very important that you work harder with diet by avoiding all foods that are white. Avoid white rice (brown & wild rice is OK), white potatoes (sweetpotatoes in moderation is OK), White bread or wheat bread or anything made out of white flour like bagels, donuts, rolls, buns, biscuits, cakes, pastries, cookies, pizza crust, and pasta (made from white flour & egg whites) - vegetarian pasta or spinach or wheat pasta is OK. Multigrain breads like Arnold's or Pepper[employer], or multigrain sandwich thins or flatbreads.  Diet, exercise and weight loss can reverse and cure diabetes in the early stages.  Diet,  exercise and weight loss is very important in the control and prevention of complications of diabetes which affects every system in your body, ie. Brain - dementia/stroke, eyes - glaucoma/blindness, heart - heart attack/heart failure, kidneys - dialysis, stomach - gastric paralysis, intestines - malabsorption, nerves - severe painful neuritis, circulation - gangrene & loss of a leg(s), and finally cancer and Alzheimers.    I recommend avoid fried & greasy foods,  sweets/candy, white rice (brown or wild rice or Quinoa is OK), white potatoes (sweet potatoes are OK) - anything made from white flour - bagels, doughnuts, rolls, buns, biscuits,white and wheat breads, pizza crust and traditional pasta made of white flour & egg white(vegetarian pasta or spinach or wheat pasta is OK).  Multi-grain bread is OK - like multi-grain flat bread or sandwich thins. Avoid alcohol in excess. Exercise is also important.    Eat all the vegetables you want - avoid meat, especially red meat and dairy - especially cheese.  Cheese is the most concentrated form of trans-fats which is the worst thing to clog up our arteries. Veggie cheese is OK which can be found in the fresh produce section at [employer] or [employer] or [employer]  ++++++++++++++++++++++ DASH Eating Plan  DASH stands for "Dietary Approaches to Stop Hypertension."   The DASH eating plan is a healthy eating plan that has been shown to reduce high blood pressure (hypertension). Additional health benefits may include reducing the risk of type 2 diabetes mellitus, heart disease, and stroke. The DASH eating plan may also help with weight loss. WHAT DO I NEED TO KNOW ABOUT THE DASH EATING PLAN? For the DASH eating plan, you will follow these general guidelines:  Choose foods with a percent daily value for sodium of less than 5% (as listed on the food label).  Use salt-free seasonings  or herbs instead of table salt or sea salt.  Check with your health care  provider or pharmacist before using salt substitutes.  Eat lower-sodium products, often labeled as "lower sodium" or "no salt added."  Eat fresh foods.  Eat more vegetables, fruits, and low-fat dairy products.  Choose whole grains. Look for the word "whole" as the first word in the ingredient list.  Choose fish   Limit sweets, desserts, sugars, and sugary drinks.  Choose heart-healthy fats.  Eat veggie cheese   Eat more home-cooked food and less restaurant, buffet, and fast food.  Limit fried foods.  Cook foods using methods other than frying.  Limit canned vegetables. If you do use them, rinse them well to decrease the sodium.  When eating at a restaurant, ask that your food be prepared with less salt, or no salt if possible.                      WHAT FOODS CAN I EAT? Read Dr Fara Olden Fuhrman's books on The End of Dieting & The End of Diabetes  Grains Whole grain or whole wheat bread. Brown rice. Whole grain or whole wheat pasta. Quinoa, bulgur, and whole grain cereals. Low-sodium cereals. Corn or whole wheat flour tortillas. Whole grain cornbread. Whole grain crackers. Low-sodium crackers.  Vegetables Fresh or frozen vegetables (raw, steamed, roasted, or grilled). Low-sodium or reduced-sodium tomato and vegetable juices. Low-sodium or reduced-sodium tomato sauce and paste. Low-sodium or reduced-sodium canned vegetables.   Fruits All fresh, canned (in natural juice), or frozen fruits.  Protein Products  All fish and seafood.  Dried beans, peas, or lentils. Unsalted nuts and seeds. Unsalted canned beans.  Dairy Low-fat dairy products, such as skim or 1% milk, 2% or reduced-fat cheeses, low-fat ricotta or cottage cheese, or plain low-fat yogurt. Low-sodium or reduced-sodium cheeses.  Fats and Oils Tub margarines without trans fats. Light or reduced-fat mayonnaise and salad dressings (reduced sodium). Avocado. Safflower, olive, or canola oils. Natural peanut or almond  butter.  Other Unsalted popcorn and pretzels. The items listed above may not be a complete list of recommended foods or beverages. Contact your dietitian for more options.  ++++++++++++++++++  WHAT FOODS ARE NOT RECOMMENDED? Grains/ White flour or wheat flour White bread. White pasta. White rice. Refined cornbread. Bagels and croissants. Crackers that contain trans fat.  Vegetables  Creamed or fried vegetables. Vegetables in a . Regular canned vegetables. Regular canned tomato sauce and paste. Regular tomato and vegetable juices.  Fruits Dried fruits. Canned fruit in light or heavy syrup. Fruit juice.  Meat and Other Protein Products Meat in general - RED meat & White meat.  Fatty cuts of meat. Ribs, chicken wings, all processed meats as bacon, sausage, bologna, salami, fatback, hot dogs, bratwurst and packaged luncheon meats.  Dairy Whole or 2% milk, cream, half-and-half, and cream cheese. Whole-fat or sweetened yogurt. Full-fat cheeses or blue cheese. Non-dairy creamers and whipped toppings. Processed cheese, cheese spreads, or cheese curds.  Condiments Onion and garlic salt, seasoned salt, table salt, and sea salt. Canned and packaged gravies. Worcestershire sauce. Tartar sauce. Barbecue sauce. Teriyaki sauce. Soy sauce, including reduced sodium. Steak sauce. Fish sauce. Oyster sauce. Cocktail sauce. Horseradish. Ketchup and mustard. Meat flavorings and tenderizers. Bouillon cubes. Hot sauce. Tabasco sauce. Marinades. Taco seasonings. Relishes.  Fats and Oils Butter, stick margarine, lard, shortening and bacon fat. Coconut, palm kernel, or palm oils. Regular salad dressings.  Pickles and olives. Salted popcorn and pretzels.  The  items listed above may not be a complete list of foods and beverages to avoid.

## 2018-12-04 LAB — CBC WITH DIFFERENTIAL/PLATELET
Absolute Monocytes: 313 cells/uL (ref 200–950)
Basophils Absolute: 70 cells/uL (ref 0–200)
Basophils Relative: 1.3 %
Eosinophils Absolute: 232 {cells}/uL (ref 15–500)
Eosinophils Relative: 4.3 %
HCT: 40.5 % (ref 35.0–45.0)
Hemoglobin: 13.4 g/dL (ref 11.7–15.5)
Lymphs Abs: 1706 cells/uL (ref 850–3900)
MCH: 30.1 pg (ref 27.0–33.0)
MCHC: 33.1 g/dL (ref 32.0–36.0)
MCV: 91 fL (ref 80.0–100.0)
MPV: 10.8 fL (ref 7.5–12.5)
Monocytes Relative: 5.8 %
Neutro Abs: 3078 cells/uL (ref 1500–7800)
Neutrophils Relative %: 57 %
Platelets: 289 10*3/uL (ref 140–400)
RBC: 4.45 10*6/uL (ref 3.80–5.10)
RDW: 13.1 % (ref 11.0–15.0)
Total Lymphocyte: 31.6 %
WBC: 5.4 10*3/uL (ref 3.8–10.8)

## 2018-12-04 LAB — HEMOGLOBIN A1C
Hgb A1c MFr Bld: 5.7 %{Hb} — ABNORMAL HIGH (ref <5.7)
Mean Plasma Glucose: 117 (calc)
eAG (mmol/L): 6.5 (calc)

## 2018-12-04 LAB — LIPID PANEL
Cholesterol: 200 mg/dL — ABNORMAL HIGH (ref <200)
HDL: 58 mg/dL (ref >=50)
LDL Cholesterol (Calc): 120 mg/dL (calc) — ABNORMAL HIGH
Non-HDL Cholesterol (Calc): 142 mg/dL (calc) — ABNORMAL HIGH (ref <130)
Total CHOL/HDL Ratio: 3.4 (calc) (ref <5.0)
Triglycerides: 114 mg/dL (ref <150)

## 2018-12-04 LAB — TSH: TSH: 2.21 mIU/L (ref 0.40–4.50)

## 2018-12-04 LAB — MAGNESIUM: Magnesium: 2.1 mg/dL (ref 1.5–2.5)

## 2018-12-04 LAB — INSULIN, RANDOM: Insulin: 8.1 u[IU]/mL

## 2018-12-04 LAB — VITAMIN D 25 HYDROXY (VIT D DEFICIENCY, FRACTURES): Vit D, 25-Hydroxy: 44 ng/mL (ref 30–100)

## 2019-01-06 ENCOUNTER — Telehealth: Payer: Self-pay

## 2019-01-06 DIAGNOSIS — F32A Depression, unspecified: Secondary | ICD-10-CM

## 2019-01-06 DIAGNOSIS — F329 Major depressive disorder, single episode, unspecified: Secondary | ICD-10-CM

## 2019-01-06 MED ORDER — BUPROPION HCL ER (XL) 300 MG PO TB24: ORAL_TABLET | ORAL | 1 refills | Status: DC

## 2019-01-06 NOTE — Telephone Encounter (Signed)
Patient has misplaced or accidentally threw away her prescription for Bupropion. Hasn't had them for two days.  Requesting a new prescription be sent in.

## 2019-01-08 ENCOUNTER — Encounter: Payer: Self-pay | Admitting: Physician Assistant

## 2019-02-07 ENCOUNTER — Encounter: Payer: Self-pay | Admitting: Adult Health

## 2019-04-25 ENCOUNTER — Other Ambulatory Visit: Payer: Self-pay | Admitting: Adult Health

## 2019-04-25 DIAGNOSIS — E559 Vitamin D deficiency, unspecified: Secondary | ICD-10-CM

## 2019-05-12 ENCOUNTER — Encounter: Payer: Self-pay | Admitting: Adult Health

## 2019-05-12 NOTE — Progress Notes (Deleted)
Complete Physical  Assessment and Plan:  Diagnoses and all orders for this visit:  Encounter for general adult medical examination with abnormal findings  Hemorrhoids, unspecified hemorrhoid type Continue anusol PRN Follow up with GI for worsening symptoms  Essential hypertension Currently at goal by lifestyle modification Monitor blood pressure at home; call if consistently over 130/80 Continue DASH diet.   Reminder to go to the ER if any CP, SOB, nausea, dizziness, severe HA, changes vision/speech, left arm numbness and tingling and jaw pain.  Psoriatic arthritis ([place]) Continue DMARD Follows with Dr. Amil Amen  Vitamin D deficiency Continue supplementation Check vitamin D level  Recurrent major depressive disorder, in partial remission ([place]) Continue medications  Lifestyle discussed: diet/exerise, sleep hygiene, stress management, hydration  Medication management CBC, CMP/GFR  Hyperlipidemia, unspecified hyperlipidemia type Borderline; not currently on medications Continue low cholesterol diet and exercise.  Check lipid panel.   Allergic state, sequela Continue OTC allergy pills  Screening Anemia; *** Vitamin B12  Screening for hematuria or proteinuria UA, microalbumin  Overweight (BMI 25.0-29.9) Long discussion about weight loss, diet, and exercise Recommended diet heavy in fruits and veggies and low in animal meats, cheeses, and dairy products, appropriate calorie intake Patient will work on avoiding junk food Will restart phentermine for 3 month course and follow up  Discussed appropriate weight for height  Follow up at next visit  Other abnormal glucose Discussed disease and risks Discussed diet/exercise, weight management  A1C   Discussed med's effects and SE's. Screening labs and tests as requested with regular follow-up as recommended. Over 40 minutes of exam, counseling, chart review, and complex, high level critical decision making was performed  this visit.   Future Appointments  Date Time Provider [place]  05/14/2019 10:00 AM Liane Comber, NP [facility] None  05/18/2020 10:00 AM Liane Comber, NP [facility] None     HPI  63 y.o. female  presents for a complete physical and follow up for has Left thyroid nodule; Hemorrhoids; Allergy; Other abnormal glucose; Hyperlipidemia; Psoriatic arthritis ([place]); Essential hypertension; Vitamin D deficiency; Medication management; Recurrent major depressive disorder, in partial remission ([place]); Cervical arthritis; and Overweight (BMI 25.0-29.9) on their problem list.   ***  She follows with Dr. Amil Amen for psoriatic arthritis, on methotrexate and stelara.  She also follows with Dr. Sherley Bounds with [facility] for her back, and Dr. Mardelle Matte for her shoulder.  She follows annually with Dr. Allyson Sabal for skin.  she is prescribed phentermine for weight loss.  While on the medication they have lost {NUMBERS [id number]:[id number]} lbs since last visit. They deny palpitations, anxiety, trouble sleeping, elevated BP.   BMI is There is no height or weight on file to calculate BMI., she is working on diet and exercise. Wt Readings from Last 3 Encounters:  12/03/18 155 lb (70.3 kg)  07/25/18 159 lb (72.1 kg)  02/05/18 155 lb 1.6 oz (70.4 kg)   Typical breakfast: Typical lunch:  Typical dinner: Exercise:  Water intake:   Her blood pressure has been controlled at home, today their BP is   She does not workout. She denies chest pain, shortness of breath, dizziness.   She is not on cholesterol medication and denies myalgias. Her cholesterol is not at goal. The cholesterol last visit was:   Lab Results  Component Value Date   CHOL 200 (H) 12/03/2018   HDL 58 12/03/2018   LDLCALC 120 (H) 12/03/2018   TRIG 114 12/03/2018   CHOLHDL 3.4 12/03/2018    Last A1C in the  office was:  Lab Results  Component Value Date   HGBA1C 5.7 (H) 12/03/2018    She has hx of left thyroid nodule;  underwent US and biopsy in 2014 which showed benign results. TSHs have been stable.   Lab Results  Component Value Date   TSH 2.21 12/03/2018   Last GFR: Lab Results  Component Value Date   GFRNONAA 78 07/25/2018   Patient is on Vitamin D supplement.  Lab Results  Component Value Date   VD25OH 44 12/03/2018       *** Lab Results  Component Value Date   VITAMINB12 276 02/05/2018     Current Medications:  Current Outpatient Medications on File Prior to Visit  Medication Sig Dispense Refill  . acetaminophen (TYLENOL) 500 MG tablet Take by mouth every 6 (six) hours as needed.    Marland Kitchen buPROPion (WELLBUTRIN XL) 300 MG 24 hr tablet TAKE 1 TABLET BY MOUTH EVERY DAY IN THE MORNING 90 tablet 1  . folic acid (FOLVITE) 1 MG tablet Take 1 mg by mouth daily.    . methotrexate (RHEUMATREX) 10 MG tablet Take 10 mg by mouth. Caution: Chemotherapy. Protect from light. Takes 12 tablets a week.    . phentermine (ADIPEX-P) 37.5 MG tablet Take 1 tablet (37.5 mg total) by mouth daily before breakfast. 30 tablet 2  . PROCTOSOL HC 2.5 % rectal cream PLACE 1 APPLICATION RECTALLY 2 (TWO) TIMES DAILY. 28.35 g 0  . ustekinumab (STELARA) 45 MG/0.5ML SOSY injection Inject 45 mg into the skin. Injects once every quarter    . Vitamin D, Ergocalciferol, (DRISDOL) 1.25 MG (50000 UT) CAPS capsule TAKE ONE CAPSULE BY MOUTH THREE TIMES WEEKLY FOR DEFICIENCY 36 capsule 1   No current facility-administered medications on file prior to visit.    Allergies:  No Known Allergies Medical History:  She has Left thyroid nodule; Hemorrhoids; Allergy; Other abnormal glucose; Hyperlipidemia; Psoriatic arthritis ([place]); Essential hypertension; Vitamin D deficiency; Medication management; Recurrent major depressive disorder, in partial remission ([place]); Cervical arthritis; and Overweight (BMI 25.0-29.9) on their problem list. Health Maintenance:   Immunization History  Administered Date(s) Administered  . PPD Test 05/07/2017  .  Tdap 12/09/2015   Tetanus: 2017 Pneumovax: N/A Prevnar 13: *** Flu vaccine: declines Zostavax: will check with Dr. Amil Amen  LMP: 20+ years s/p TAH Pap: 2017 *** insatisfatory, neg HPV *** MGM: 01/2018 DUE *** DEXA: *** Colonoscopy: 2014 due 2024  Thyroid US/biopsy: 2014, benign  Last Dental Exam: q6 months, 2018 Last Eye Exam: 2017, has up appointment  Patient Care Team: Unk Pinto, MD as PCP - General (Internal Medicine) Wonda Horner, MD as Consulting Physician (Gastroenterology) Hennie Duos, MD as Consulting Physician (Rheumatology) Marchia Bond, MD as Consulting Physician (Orthopedic Surgery) Elberta Spaniel, MD as Referring Physician (Neurology)  Surgical History:  She has a past surgical history that includes Cholecystectomy (1997); Shoulder surgery (03/2012); Cervical fusion (2005); Anterior cervical decomp/discectomy fusion (08/08/2012); Total shoulder arthroplasty (09/24/2012); Eye surgery; Lumbar laminectomy/decompression microdiscectomy (01/01/2013); Lumbar fusion (01/01/2013); Abdominal hysterectomy (1997); Mass excision (N/A, 01/29/2013); Hemorrhoid surgery (N/A, 09/02/2013); and Reduction mammaplasty (Bilateral). Family History:  Herfamily history includes Breast cancer in her cousin, cousin, and maternal aunt; Cancer in her paternal grandfather; Dementia in her mother; Diabetes in her father; Heart disease in her father; Lung cancer (age of onset: 52) in her maternal grandfather; Ovarian cancer (age of onset: 81) in her paternal grandmother; Pancreatitis in her father; Thyroid disease in her maternal grandmother. Social History:  She reports that  she has never smoked. She has never used smokeless tobacco. She reports that she does not drink alcohol or use drugs.  Review of Systems: Review of Systems  Constitutional: Negative for malaise/fatigue and weight loss.  HENT: Negative for hearing loss and tinnitus.   Eyes: Negative for blurred vision and double  vision.  Respiratory: Negative for cough, sputum production, shortness of breath and wheezing.   Cardiovascular: Negative for chest pain, palpitations, orthopnea, claudication, leg swelling and PND.  Gastrointestinal: Negative for abdominal pain, blood in stool, constipation, diarrhea, heartburn, melena, nausea and vomiting.  Genitourinary: Negative.   Musculoskeletal: Negative for falls, joint pain and myalgias.  Skin: Negative for rash.  Neurological: Negative for dizziness, tingling, sensory change, weakness and headaches.  Endo/Heme/Allergies: Negative for polydipsia.  Psychiatric/Behavioral: Negative.  Negative for depression, memory loss, substance abuse and suicidal ideas. The patient is not nervous/anxious and does not have insomnia.   All other systems reviewed and are negative.   Physical Exam: Estimated body mass index is 30.27 kg/m as calculated from the following:   Height as of 12/03/18: 5' (1.524 m).   Weight as of 12/03/18: 155 lb (70.3 kg). There were no vitals taken for this visit. General Appearance: Well nourished, in no apparent distress.  Eyes: PERRLA, EOMs, conjunctiva no swelling or erythema, normal fundi and vessels.  Sinuses: No Frontal/maxillary tenderness  ENT/Mouth: Ext aud canals clear, normal light reflex with TMs without erythema, bulging. Good dentition. No erythema, swelling, or exudate on post pharynx. Tonsils not swollen or erythematous. Hearing normal.  Neck: Supple, thyroid normal. No bruits  Respiratory: Respiratory effort normal, BS equal bilaterally without rales, rhonchi, wheezing or stridor.  Cardio: RRR without murmurs, rubs or gallops. Brisk peripheral pulses without edema.  Chest: symmetric, with normal excursions and percussion.  Breasts: Symmetric, without lumps, nipple discharge, retractions.  Abdomen: Soft, nontender, no guarding, rebound, hernias, masses, or organomegaly.  Lymphatics: Non tender without lymphadenopathy.  Genitourinary:  Defer Musculoskeletal: Full ROM all peripheral extremities,5/5 strength, and normal gait.  Skin: Warm, dry, lesions, ecchymosis. psoriatic rash scattered - erythematous, scaly/flaky Neuro: Cranial nerves intact, reflexes equal bilaterally. Normal muscle tone, no cerebellar symptoms. Sensation intact.  Psych: Awake and oriented X 3, normal affect, Insight and Judgment appropriate.   EKG: WNL no ST changes.  Gorden Harms Ronel Rodeheaver 12:59 PM [facility]

## 2019-05-14 ENCOUNTER — Ambulatory Visit: Payer: Managed Care, Other (non HMO) | Admitting: Adult Health

## 2019-05-14 ENCOUNTER — Encounter: Payer: Managed Care, Other (non HMO) | Admitting: Adult Health

## 2019-06-02 NOTE — Progress Notes (Signed)
Complete Physical  Assessment and Plan:  Diagnoses and all orders for this visit:  Encounter for general adult medical examination with abnormal findings  Hemorrhoids, unspecified hemorrhoid type Continue anusol PRN Follow up with GI for worsening symptoms  Essential hypertension Currently at goal by lifestyle modification Monitor blood pressure at home; call if consistently over 130/80 Continue DASH diet.   Reminder to go to the ER if any CP, SOB, nausea, dizziness, severe HA, changes vision/speech, left arm numbness and tingling and jaw pain.  Psoriatic arthritis ([place]) Continue DMARD Follows with Gabrielle Duncan  Vitamin D deficiency Continue supplementation Check vitamin D level  Recurrent major depressive disorder, in partial remission ([place]) Continue medications  Lifestyle discussed: diet/exerise, sleep hygiene, stress management, hydration  Medication management CBC, CMP/GFR  Hyperlipidemia, unspecified hyperlipidemia type Borderline; not currently on medications Continue low cholesterol diet and exercise.  Check lipid panel.   Allergic state, sequela Continue OTC allergy pills  Screening Anemia;  Vitamin B12  Screening for hematuria or proteinuria UA, microalbumin  Overweight (BMI 25.0-29.9) Long discussion about weight loss, diet, and exercise Recommended diet heavy in fruits and veggies and low in animal meats, cheeses, and dairy products, appropriate calorie intake Patient will work on avoiding junk food Will restart phentermine for 3 month course and follow up  Discussed appropriate weight for height  Follow up at next visit  Other abnormal glucose Discussed disease and risks Discussed diet/exercise, weight management  A1C  [place] Significant EOM abnormalities, MRI by opth was unremarkable; recommended neuro referral which was provided today for possible MG  Fatigue/polyarthritis/multiple tick exposure Patient request to r/o Lyme's due to above  symptoms and what she believes were erythema migraines rash follwoing numerous tick exposures this year; B. burgdorfi antibodies checked today    Discussed med's effects and SE's. Screening labs and tests as requested with regular follow-up as recommended. Over 40 minutes of exam, counseling, chart review, and complex, high level critical decision making was performed this visit.   Future Appointments  Date Time Provider [place]  09/05/2019  8:45 AM Gabrielle Comber, NP [facility] None  02/02/2020  8:45 AM Gabrielle Comber, NP [facility] None  06/03/2020 10:00 AM Gabrielle Comber, NP [facility] None     HPI  63 y.o. female  presents for a complete physical and follow up for has Hemorrhoids; Allergy; Other abnormal glucose; Hyperlipidemia; Psoriatic arthritis ([place]); Essential hypertension; Vitamin D deficiency; Medication management; Recurrent major depressive disorder, in partial remission ([place]); Cervical arthritis; and Overweight (BMI 25.0-29.9) on their problem list.   She is divorced, 2 kids, 1 passed due to pneumonia. Son is well, 1 grandchild, 65 y/o, has cochlear implants and doing well.   She has steady boyfriend of many years whom she lives with.    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   She follows with Gabrielle Duncan for psoriatic arthritis, on methotrexate and stelara.  She is deferring medicare/retirement due to expense of these drugs.  She reported ~6 month history double vision to to vision provider recently, was recommended and underwent MRI brain which showed no abnormality, also reports intermittent stumbling gait (had attributed to hip arthritis) and numerous falls in last 6 months, is there was concern for possible MG and has been advised for neurology referral which she would like today.   She also follows with Gabrielle Duncan with [facility] for her back, and Gabrielle Duncan for her shoulder and hips, discussing possible bil hip  replacements.  She follows annually with Gabrielle Duncan for skin, scheduled to see tomorrow.   she is prescribed phentermine for weight loss but hasn't taken in several months.  While on the medication they have lost 0 lbs since last visit. They deny palpitations, anxiety, trouble sleeping, elevated BP.   BMI is Body mass index is 28.44 kg/m., she is working on diet and exercise, was down to 148 lb prior to covid 19 but has gained back due to admitted excessive snacking while at work. She does walk on work breaks, 1-2 miles when weather cools.  Wt Readings from Last 3 Encounters:  06/04/19 158 lb (71.7 kg)  12/03/18 155 lb (70.3 kg)  07/25/18 159 lb (72.1 kg)   Her blood pressure has been controlled at home, today their BP is BP: 118/74 She does not workout. She denies chest pain, shortness of breath, dizziness.   She is not on cholesterol medication and denies myalgias. Her cholesterol is not at goal. The cholesterol last visit was:   Lab Results  Component Value Date   CHOL 200 (H) 12/03/2018   HDL 58 12/03/2018   LDLCALC 120 (H) 12/03/2018   TRIG 114 12/03/2018   CHOLHDL 3.4 12/03/2018    Last A1C in the office was:  Lab Results  Component Value Date   HGBA1C 5.7 (H) 12/03/2018    She has hx of left thyroid nodule; underwent US and biopsy in 2014 which showed benign results. TSHs have been stable.   Lab Results  Component Value Date   TSH 2.21 12/03/2018   Last GFR: Lab Results  Component Value Date   GFRNONAA 78 07/25/2018   Patient is on Vitamin D supplement, taking 50000 3-4 days a week  Lab Results  Component Value Date   VD25OH 53 12/03/2018     She has hx of low B12, not on supplement, does want levels checked today Lab Results  Component Value Date   VITAMINB12 276 02/05/2018     Current Medications:  Current Outpatient Medications on File Prior to Visit  Medication Sig Dispense Refill  . acetaminophen (TYLENOL) 500 MG tablet Take by mouth every 6 (six)  hours as needed.    Marland Kitchen buPROPion (WELLBUTRIN XL) 300 MG 24 hr tablet TAKE 1 TABLET BY MOUTH EVERY DAY IN THE MORNING 90 tablet 1  . folic acid (FOLVITE) 1 MG tablet Take 1 mg by mouth daily.    . methotrexate (RHEUMATREX) 10 MG tablet Take 10 mg by mouth. Caution: Chemotherapy. Protect from light. Takes 12 tablets a week.    Marland Kitchen PROCTOSOL HC 2.5 % rectal cream PLACE 1 APPLICATION RECTALLY 2 (TWO) TIMES DAILY. 28.35 g 0  . ustekinumab (STELARA) 45 MG/0.5ML SOSY injection Inject 45 mg into the skin. Injects once every quarter    . Vitamin D, Ergocalciferol, (DRISDOL) 1.25 MG (50000 UT) CAPS capsule TAKE ONE CAPSULE BY MOUTH THREE TIMES WEEKLY FOR DEFICIENCY 36 capsule 1   No current facility-administered medications on file prior to visit.    Allergies:  No Known Allergies Medical History:  She has Hemorrhoids; Allergy; Other abnormal glucose; Hyperlipidemia; Psoriatic arthritis ([place]); Essential hypertension; Vitamin D deficiency; Medication management; Recurrent major depressive disorder, in partial remission ([place]); Cervical arthritis; and Overweight (BMI 25.0-29.9) on their problem list. Health Maintenance:   Immunization History  Administered Date(s) Administered  . PPD Test 05/07/2017  . Pneumococcal Conjugate-13 06/04/2019  . Tdap 12/09/2015   Tetanus: 2017 Pneumovax: N/A Prevnar 13: DUE Flu vaccine: declines Zostavax: will check with  Gabrielle Duncan  LMP: 20+ years s/p TAH Pap: 2017  neg HPV, never abnormal, DONE MGM: 01/2018 DUE - patient will schedule  DEXA: will order and schedule with mammogram   Colonoscopy: 2014 due 2024  Thyroid US/biopsy: 2014, benign  Last Dental Exam: q6 months, 2020 Last Eye Exam: [facility] (group with Dr. Coral Ceo), last visit 2020, q46m  Patient Care Team: Unk Pinto, MD as PCP - General (Internal Medicine) Wonda Horner, MD as Consulting Physician (Gastroenterology) Hennie Duos, MD as Consulting Physician  (Rheumatology) Marchia Bond, MD as Consulting Physician (Orthopedic Surgery) Elberta Spaniel, MD as Referring Physician (Neurology)  Surgical History:  She has a past surgical history that includes Cholecystectomy (1997); Shoulder surgery (03/2012); Cervical fusion (2005); Anterior cervical decomp/discectomy fusion (08/08/2012); Total shoulder arthroplasty (09/24/2012); Eye surgery; Lumbar laminectomy/decompression microdiscectomy (01/01/2013); Lumbar fusion (01/01/2013); Abdominal hysterectomy (1997); Mass excision (N/A, 01/29/2013); Hemorrhoid surgery (N/A, 09/02/2013); and Reduction mammaplasty (Bilateral). Family History:  Herfamily history includes Asthma in her daughter; Breast cancer in her cousin, cousin, and maternal aunt; CAD (age of onset: 59) in her mother; Cancer in her paternal grandfather; Dementia in her mother; Diabetes in her father; Heart disease in her father; Lung cancer (age of onset: 41) in her maternal grandfather; Ovarian cancer (age of onset: 22) in her paternal grandmother; Pancreatitis in her father; Thyroid disease in her maternal grandmother. Social History:  She reports that she has never smoked. She has never used smokeless tobacco. She reports that she does not drink alcohol or use drugs.  Review of Systems: Review of Systems  Constitutional: Negative for malaise/fatigue and weight loss.  HENT: Negative for hearing loss and tinnitus.   Eyes: Positive for double vision and photophobia (reports AM photosensitivity, new). Negative for blurred vision, pain, discharge and redness.  Respiratory: Negative for cough, sputum production, shortness of breath and wheezing.   Cardiovascular: Negative for chest pain, palpitations, orthopnea, claudication, leg swelling and PND.  Gastrointestinal: Negative for abdominal pain, blood in stool, constipation, diarrhea, heartburn, melena, nausea and vomiting.  Genitourinary: Negative.   Musculoskeletal: Positive for falls and joint pain.  Negative for myalgias.       Intermittent stumbling gait  Skin: Negative for rash.  Neurological: Positive for speech change (reports intermittent slurring, denies dysphagia) and headaches. Negative for dizziness (intermittent), tingling, tremors, sensory change, focal weakness, seizures, loss of consciousness and weakness.  Endo/Heme/Allergies: Negative for polydipsia.  Psychiatric/Behavioral: Negative.  Negative for depression, memory loss, substance abuse and suicidal ideas. The patient is not nervous/anxious and does not have insomnia.   All other systems reviewed and are negative.   Physical Exam: Estimated body mass index is 28.44 kg/m as calculated from the following:   Height as of this encounter: 5' 2.5" (1.588 m).   Weight as of this encounter: 158 lb (71.7 kg). BP 118/74   Pulse 62   Temp (!) 97.2 F (36.2 C)   Ht 5' 2.5" (1.588 m)   Wt 158 lb (71.7 kg)   SpO2 98%   BMI 28.44 kg/m  General Appearance: Well nourished, in no apparent distress.  Eyes: Pupils symmetrical but with significant difficulty focusing with erratic pupil reactions, EOMs abnormal throughout with frequent asymmetrical movement throughout,  bilaterally in all directions, frequent strabismus with attempts; conjunctiva no swelling or erythema Sinuses: No Frontal/maxillary tenderness  ENT/Mouth: Ext aud canals clear, normal light reflex with TMs without erythema, bulging. Good dentition. No erythema, swelling, or exudate on post pharynx. Tonsils not swollen or erythematous. Hearing  normal.  Neck: Supple, thyroid normal. No bruits  Respiratory: Respiratory effort normal, BS equal bilaterally without rales, rhonchi, wheezing or stridor.  Cardio: RRR without murmurs, rubs or gallops. Brisk peripheral pulses without edema.  Chest: symmetric, with normal excursions and percussion.  Breasts: Symmetric, without lumps, nipple discharge, retractions.  Abdomen: Soft, nontender, no guarding, rebound, hernias, masses,  or organomegaly.  Lymphatics: Non tender without lymphadenopathy.  Genitourinary: Defer Musculoskeletal: Full ROM all peripheral extremities, 5/5 strength, and normal gait.  Skin: Warm, dry, without lesions, ecchymosis. psoriatic rash scattered - erythematous, scaly/flaky Neuro: Cranial nerves intact excepting III, IV, VI as noted by EOMs,  reflexes equal bilaterally. Normal muscle tone, no cerebellar symptoms. Sensation intact. Normal RAM, heel to shin; cannot complete nose to finger; she does have normal gait, heel to toe, no pronator drift Psych: Awake and oriented X 3, pleasant, normal affect, Insight and Judgment appropriate.   EKG: WNL, mild sinus brady, no ST changes.  Gorden Harms Kareena Arrambide 12:55 PM [facility]

## 2019-06-04 ENCOUNTER — Encounter: Payer: Self-pay | Admitting: Adult Health

## 2019-06-04 ENCOUNTER — Ambulatory Visit: Payer: Managed Care, Other (non HMO) | Admitting: Adult Health

## 2019-06-04 ENCOUNTER — Other Ambulatory Visit: Payer: Self-pay

## 2019-06-04 VITALS — BP 118/74 | HR 62 | Temp 97.2°F | Ht 62.5 in | Wt 158.0 lb

## 2019-06-04 DIAGNOSIS — E2839 Other primary ovarian failure: Secondary | ICD-10-CM

## 2019-06-04 DIAGNOSIS — Z79899 Other long term (current) drug therapy: Secondary | ICD-10-CM | POA: Diagnosis not present

## 2019-06-04 DIAGNOSIS — Z0001 Encounter for general adult medical examination with abnormal findings: Secondary | ICD-10-CM

## 2019-06-04 DIAGNOSIS — Z13220 Encounter for screening for lipoid disorders: Secondary | ICD-10-CM

## 2019-06-04 DIAGNOSIS — Z0000 Encounter for general adult medical examination without abnormal findings: Secondary | ICD-10-CM | POA: Diagnosis not present

## 2019-06-04 DIAGNOSIS — I10 Essential (primary) hypertension: Secondary | ICD-10-CM

## 2019-06-04 DIAGNOSIS — Z23 Encounter for immunization: Secondary | ICD-10-CM | POA: Diagnosis not present

## 2019-06-04 DIAGNOSIS — R296 Repeated falls: Secondary | ICD-10-CM

## 2019-06-04 DIAGNOSIS — R5383 Other fatigue: Secondary | ICD-10-CM

## 2019-06-04 DIAGNOSIS — E559 Vitamin D deficiency, unspecified: Secondary | ICD-10-CM | POA: Diagnosis not present

## 2019-06-04 DIAGNOSIS — Z1389 Encounter for screening for other disorder: Secondary | ICD-10-CM | POA: Diagnosis not present

## 2019-06-04 DIAGNOSIS — E663 Overweight: Secondary | ICD-10-CM

## 2019-06-04 DIAGNOSIS — Z131 Encounter for screening for diabetes mellitus: Secondary | ICD-10-CM

## 2019-06-04 DIAGNOSIS — E785 Hyperlipidemia, unspecified: Secondary | ICD-10-CM

## 2019-06-04 DIAGNOSIS — R2689 Other abnormalities of gait and mobility: Secondary | ICD-10-CM

## 2019-06-04 DIAGNOSIS — M2550 Pain in unspecified joint: Secondary | ICD-10-CM

## 2019-06-04 DIAGNOSIS — K649 Unspecified hemorrhoids: Secondary | ICD-10-CM

## 2019-06-04 DIAGNOSIS — W57XXXS Bitten or stung by nonvenomous insect and other nonvenomous arthropods, sequela: Secondary | ICD-10-CM

## 2019-06-04 DIAGNOSIS — H532 Diplopia: Secondary | ICD-10-CM

## 2019-06-04 DIAGNOSIS — Z136 Encounter for screening for cardiovascular disorders: Secondary | ICD-10-CM

## 2019-06-04 DIAGNOSIS — M47812 Spondylosis without myelopathy or radiculopathy, cervical region: Secondary | ICD-10-CM

## 2019-06-04 DIAGNOSIS — R7309 Other abnormal glucose: Secondary | ICD-10-CM

## 2019-06-04 DIAGNOSIS — T7840XS Allergy, unspecified, sequela: Secondary | ICD-10-CM

## 2019-06-04 DIAGNOSIS — L4050 Arthropathic psoriasis, unspecified: Secondary | ICD-10-CM

## 2019-06-04 DIAGNOSIS — F3341 Major depressive disorder, recurrent, in partial remission: Secondary | ICD-10-CM

## 2019-06-04 MED ORDER — PHENTERMINE HCL 37.5 MG PO TABS: 37.5000 mg | ORAL_TABLET | Freq: Every day | ORAL | 2 refills | Status: DC

## 2019-06-04 NOTE — Patient Instructions (Addendum)
Ms. Foppiano , Thank you for taking time to come for your Annual Wellness Visit. I appreciate your ongoing commitment to your health goals. Please review the following plan we discussed and let me know if I can assist you in the future.   These are the goals we discussed: Goals   None     This is a list of the screening recommended for you and due dates:  Health Maintenance  Topic Date Due  . Pap Smear  12/09/2018  . DEXA scan (bone density measurement)  02/04/2019  . Pneumonia vaccines (1 of 2 - PCV13) 02/04/2019  . Flu Shot  05/03/2019  . Colon Cancer Screening  10/02/2022*  . Mammogram  02/22/2020  . Tetanus Vaccine  12/08/2025  .  Hepatitis C: One time screening is recommended by Center for Disease Control  (CDC) for  adults born from [birth year] through 1967.   Completed  . HIV Screening  Completed  *Topic was postponed. The date shown is not the original due date.     HOW TO SCHEDULE A MAMMOGRAM  [facility]  7 a.m.-6:30 p.m., Monday 7 a.m.-5 p.m., Tuesday-Friday Schedule an appointment by calling [phone].     Know what a healthy weight is for you (roughly BMI <25) and aim to maintain this  Aim for 7+ servings of fruits and vegetables daily  65-80+ fluid ounces of water or unsweet tea for healthy kidneys  Limit to max 1 drink of alcohol per day; avoid smoking/tobacco  Limit animal fats in diet for cholesterol and heart health - choose grass fed whenever available  Avoid highly processed foods, and foods high in saturated/trans fats  Aim for low stress - take time to unwind and care for your mental health  Aim for 150 min of moderate intensity exercise weekly for heart health, and weights twice weekly for bone health  Aim for 7-9 hours of sleep daily    Myasthenia Gravis Myasthenia gravis (MG) is a long-term (chronic) condition that causes weakness in the muscles you can control (voluntary muscles). MG can affect any voluntary  muscle. The muscles most often affected are the ones that control:  Eye movement.  Facial movements.  Swallowing. MG is a disease in which the body's disease-fighting system (immune system) attacks its own healthy tissues (autoimmune disease). When you have MG, your immune system makes proteins (antibodies) that block the chemical (acetylcholine) that your body needs to send nerve signals to your muscles. This causes muscle weakness. What are the causes? The exact cause of MG is not known. What increases the risk? The following factors may make you more likely to develop this condition:  Having an enlarged thymus gland. The thymus gland is located under the breastbone. It makes certain cells for the immune system.  Having a family history of MG. What are the signs or symptoms? Symptoms of MG may include:  Drooping eyelids.  Double vision.  Muscle weakness that gets worse with activity and gets better after rest.  Difficulty walking.  Trouble chewing and swallowing.  Trouble making facial expressions.  Slurred speech.  Weakness of the arms, hands, and legs. Sudden, severe difficulty breathing (myasthenic crisis) may develop after having:  An infection.  A fever.  A bad reaction to a medicine. Myasthenic crisis requires emergency breathing support. Sometimes symptoms of MG go away for a while (remission) and then come back later. How is this diagnosed? This condition may be diagnosed based on:  Your symptoms  and medical history.  A physical exam.  Blood tests.  Tests of your muscle strength and function.  Imaging tests, such as a CT scan or an MRI. How is this treated? The goal of treatment is to improve muscle strength. Treatment may include:  Taking medicine.  Making lifestyle changes that focus on saving your energy.  Doing physical therapy to gain strength.  Having surgery to remove the thymus gland (thymectomy). This may result in a long remission  for some people.  Having a procedure to remove the acetylcholine antibodies (plasmapheresis).  Getting emergency breathing support, if you experience myasthenic crisis. If you experience remission, you may be able to stop treatment and then resume treatment when your symptoms return. Follow these instructions at home:   Take over-the-counter and prescription medicines only as told by your health care provider.  Get plenty of rest and sleep. Take frequent breaks to rest your eyes, especially when in bright light or working on a computer.  Maintain a healthy diet and a healthy weight. Work with your health care provider or a diet and nutrition specialist (dietitian) if you need help.  Do exercises as told by your health care provider or physical therapist.  Do not use any products that contain nicotine or tobacco, such as cigarettes and e-cigarettes. If you need help quitting, ask your health care provider.  Prevent infections by: ? Washing your hands often with soap and water. If soap and water are not available, use hand sanitizer. ? Avoiding contact with other people who are sick. ? Avoiding touching your eyes, nose, and mouth. ? Cleaning surfaces in your home that are touched often using a disinfectant.  Keep all follow-up visits as told by your health care provider. This is important. Contact a health care provider if:  Your symptoms change or get worse, especially after having a fever or infection. Get help right away if:  You have trouble breathing. Summary  Myasthenia gravis (MG) is a long-term (chronic) condition that causes weakness in the muscles you can control (voluntary muscles).  A symptom of MG is muscle weakness that gets worse with activity and gets better after rest.  Sudden, severe difficulty breathing (myasthenic crisis) may develop after having an infection, a fever, or a bad reaction to a medicine.  The goal of treatment is to improve muscle strength.  Treatment may include medicines, lifestyle changes, physical therapy, surgery, plasmapheresis, or emergency breathing support. This information is not intended to replace advice given to you by your health care provider. Make sure you discuss any questions you have with your health care provider. Document Released: 12/25/2000 Document Revised: 10/01/2017 Document Reviewed: 10/01/2017 Elsevier Patient Education  2020 [employer].

## 2019-06-05 LAB — CBC WITH DIFFERENTIAL/PLATELET
Absolute Monocytes: 336 cells/uL (ref 200–950)
Basophils Absolute: 41 cells/uL (ref 0–200)
Basophils Relative: 0.7 %
Eosinophils Absolute: 218 cells/uL (ref 15–500)
Eosinophils Relative: 3.7 %
HCT: 38.9 % (ref 35.0–45.0)
Hemoglobin: 13 g/dL (ref 11.7–15.5)
Lymphs Abs: 1558 cells/uL (ref 850–3900)
MCH: 30.9 pg (ref 27.0–33.0)
MCHC: 33.4 g/dL (ref 32.0–36.0)
MCV: 92.4 fL (ref 80.0–100.0)
MPV: 11.1 fL (ref 7.5–12.5)
Monocytes Relative: 5.7 %
Neutro Abs: 3747 cells/uL (ref 1500–7800)
Neutrophils Relative %: 63.5 %
Platelets: 281 10*3/uL (ref 140–400)
RBC: 4.21 10*6/uL (ref 3.80–5.10)
RDW: 13.1 % (ref 11.0–15.0)
Total Lymphocyte: 26.4 %
WBC: 5.9 10*3/uL (ref 3.8–10.8)

## 2019-06-05 LAB — COMPLETE METABOLIC PANEL WITH GFR
AG Ratio: 2.1 (calc) (ref 1.0–2.5)
ALT: 17 U/L (ref 6–29)
AST: 17 U/L (ref 10–35)
Albumin: 4.5 g/dL (ref 3.6–5.1)
Alkaline phosphatase (APISO): 51 U/L (ref 37–153)
BUN: 13 mg/dL (ref 7–25)
CO2: 27 mmol/L (ref 20–32)
Calcium: 9.4 mg/dL (ref 8.6–10.4)
Chloride: 105 mmol/L (ref 98–110)
Creat: 0.69 mg/dL (ref 0.50–0.99)
GFR, Est African American: 106 mL/min/{1.73_m2} (ref >=60)
GFR, Est Non African American: 91 mL/min/{1.73_m2} (ref >=60)
Globulin: 2.1 g/dL (calc) (ref 1.9–3.7)
Glucose, Bld: 92 mg/dL (ref 65–99)
Potassium: 4.5 mmol/L (ref 3.5–5.3)
Sodium: 139 mmol/L (ref 135–146)
Total Bilirubin: 0.3 mg/dL (ref 0.2–1.2)
Total Protein: 6.6 g/dL (ref 6.1–8.1)

## 2019-06-05 LAB — VITAMIN B12: Vitamin B-12: 280 pg/mL (ref 200–1100)

## 2019-06-05 LAB — URINALYSIS, ROUTINE W REFLEX MICROSCOPIC
Bilirubin Urine: NEGATIVE
Glucose, UA: NEGATIVE
Hgb urine dipstick: NEGATIVE
Ketones, ur: NEGATIVE
Leukocytes,Ua: NEGATIVE
Nitrite: NEGATIVE
Protein, ur: NEGATIVE
Specific Gravity, Urine: 1.018 (ref 1.001–1.03)
pH: <=5 (ref 5.0–8.0)

## 2019-06-05 LAB — VITAMIN D 25 HYDROXY (VIT D DEFICIENCY, FRACTURES): Vit D, 25-Hydroxy: 40 ng/mL (ref 30–100)

## 2019-06-05 LAB — MICROALBUMIN / CREATININE URINE RATIO
Creatinine, Urine: 57 mg/dL (ref 20–275)
Microalb Creat Ratio: 4 mcg/mg creat (ref <30)
Microalb, Ur: 0.2 mg/dL

## 2019-06-05 LAB — HEMOGLOBIN A1C
Hgb A1c MFr Bld: 5.6 % of total Hgb (ref <5.7)
Mean Plasma Glucose: 114 (calc)
eAG (mmol/L): 6.3 (calc)

## 2019-06-05 LAB — LIPID PANEL
Cholesterol: 231 mg/dL — ABNORMAL HIGH (ref <200)
HDL: 54 mg/dL (ref >=50)
LDL Cholesterol (Calc): 149 mg/dL (calc) — ABNORMAL HIGH
Non-HDL Cholesterol (Calc): 177 mg/dL (calc) — ABNORMAL HIGH (ref <130)
Total CHOL/HDL Ratio: 4.3 (calc) (ref <5.0)
Triglycerides: 153 mg/dL — ABNORMAL HIGH (ref <150)

## 2019-06-05 LAB — TSH: TSH: 1.35 mIU/L (ref 0.40–4.50)

## 2019-06-05 LAB — MAGNESIUM: Magnesium: 2 mg/dL (ref 1.5–2.5)

## 2019-06-05 LAB — B. BURGDORFI ANTIBODIES: B burgdorferi Ab IgG+IgM: <0.9 index

## 2019-06-11 ENCOUNTER — Other Ambulatory Visit: Payer: Self-pay

## 2019-06-11 ENCOUNTER — Encounter: Payer: Self-pay | Admitting: Neurology

## 2019-06-11 ENCOUNTER — Ambulatory Visit: Payer: Managed Care, Other (non HMO) | Admitting: Neurology

## 2019-06-11 VITALS — BP 121/83 | HR 75 | Ht 64.0 in | Wt 156.0 lb

## 2019-06-11 DIAGNOSIS — L4050 Arthropathic psoriasis, unspecified: Secondary | ICD-10-CM | POA: Diagnosis not present

## 2019-06-11 DIAGNOSIS — H532 Diplopia: Secondary | ICD-10-CM | POA: Diagnosis not present

## 2019-06-11 DIAGNOSIS — H5000 Unspecified esotropia: Secondary | ICD-10-CM | POA: Insufficient documentation

## 2019-06-11 NOTE — Patient Instructions (Signed)
Strabismus, Adult  Strabismus is a condition in which your eye muscles do not work together to keep both of your eyes looking in the same direction (binocular vision). One eye is either turned in, out, up, or down. This is often called being cross-eyed. When the eyes do not move together (are not aligned), two images are seen. This results in double vision (diplopia). What are the causes? This condition may be caused by:  Stroke.  Complex migraine headache.  Bell palsy.  Viral infection.  Tumors or abnormal blood vessels growing behind the eye.  Thyroid gland disease.  Brain tumor.  Traumatic brain injury (TBI).  Muscle relaxants or other drugs and medicines.  Guillain-Barre syndrome.  Graves disease.  Botulism. What increases the risk? This condition is more likely to develop in people who have:  A family history of strabismus.  Farsightedness (hyperopia).  Other diseases or conditions that contribute to vision loss. What are the signs or symptoms? The main symptom of this condition is noticing that one of your eyes is turned in, out, up, or down. Other symptoms may include:  Sensitivity to bright light. You may squint or keep one eye closed.  Poor depth perception. This may make driving difficult.  Headaches.  Eye fatigue or strain.  Double vision.  Eyes that do not move together.  Excessive sleepiness.  Having difficulty reading. How is this diagnosed? This condition may be diagnosed by medical history and physical exam. An eye specialist (optometrist or ophthalmologist) will examine your eyes. You may also have tests to:  Evaluate how well you can see (visual acuity test).  Check how well your eyes focus and move together (alignment and focusing test). How is this treated? Treatment for strabismus depends on what is causing the condition. Treatment will focus on addressing the underlying cause. Treatment may include:  Wearing glasses.  Wearing an  eye patch.  Eye drops.  Surgery to help align the eyes.  Eye muscle exercises. Follow these instructions at home:  Wear glasses or an eye patch, if prescribed by your health care provider.  Do eye muscle exercises as told by your health care provider, if this applies.  Keep all follow-up visits as told by your health care provider. This is important.  Do not drive or use heavy machinery if your vision is blurry. Contact a health care provider if:  Your eye turning gets worse.  Your vision gets worse. This information is not intended to replace advice given to you by your health care provider. Make sure you discuss any questions you have with your health care provider. Document Released: 12/26/2007 Document Revised: 08/31/2017 Document Reviewed: 11/02/2015 Elsevier Patient Education  2020 [employer]. Diplopia Diplopia is a condition in which a person sees two of a single object. It is also called double vision. There are two types of diplopia.  Monocular diplopia. This is double vision that affects only one eye. Monocular diplopia is often caused by a clouding of the lens in your eye (cataract) or by a problem in the way your eye focuses light.  Binocular diplopia. This is double vision that affects both eyes. However, when you shut one eye, the double vision will go away. Binocular diplopia may be more serious. It can be caused by: ? Problems with the nerves or muscles that are responsible for eye movement. ? Disease of the nerves (neurologic disease). ? Immune system conditions, such as Graves' disease. ? Migraine headaches. ? Tumors. ? An infection. ? A stroke. ?  An injury. There are many causes of diplopia. Some are not dangerous and can be easily corrected. Diplopia may also be a symptom of a serious medical problem. You may need to see a health care provider who specializes in eye conditions (ophthalmologist) or a nerve specialist (neurologist) to find the cause. Follow  these instructions at home:   Pay attention to any changes in your vision. Tell your health care provider about them.  Do not drive or operate heavy machinery if diplopia interferes with your vision.  Keep all follow-up visits as told by your health care provider. This is important. Contact a health care provider if:  Your diplopia gets worse.  You develop any other symptoms along with your diplopia, such as: ? Weakness. ? Numbness. ? Headache. ? Eye pain. ? Clumsiness. ? Nausea. ? Drooping eyelids. ? Abnormal movement of one eye. Get help right away if you:  Have sudden vision loss.  Suddenly get a very bad headache.  Have sudden weakness or numbness.  Suddenly lose the ability to speak, understand speech, or both. These symptoms may represent a serious problem that is an emergency. Do not wait to see if the symptoms will go away. Get medical help right away. Call your local emergency services (911 in the [place] Do not drive yourself to the hospital. Summary  Diplopia is a condition in which a person sees two of a single object. It is also called double vision.  Monocular diplopia is double vision that affects only one eye. It is often caused by a clouding of the lens in your eye (cataract) or by a problem in the way your eye focuses light.  Binocular diplopia is double vision that affects both eyes. However, when you shut one eye, the double vision will go away. Binocular diplopia may be more serious.  If you have diplopia, you may need to see a health care provider who specializes in eye conditions (ophthalmologist) or a nerve specialist (neurologist) to find the cause. This information is not intended to replace advice given to you by your health care provider. Make sure you discuss any questions you have with your health care provider. Document Released: 07/20/2004 Document Revised: 09/12/2017 Document Reviewed: 09/12/2017 Elsevier Patient Education  2020 [employer].

## 2019-06-11 NOTE — Progress Notes (Signed)
Provider:  Larey Seat, M D  Referring Provider: Unk Pinto, MD Primary Care Physician:  Unk Pinto, MD  Chief Complaint  Patient presents with  . New Patient (Initial Visit)    rm 9, alone. Concerned about MG. Had a brain scan, reported as normal. Having diplopia for past 4-6 months. Having falls with bruises.    HPI:  Gabrielle Duncan is a 63 y.o. female , right handed Patient  And self employed Engineer, maintenance (IT)- is seen here today 06-11-2019  Upon referral from Dr. Melford Aase for falls and diplopia. She is very busy with her [employer] office work, and staff has worked from home. She has bean walking into walls, and not finding relief with reading glasses- There is a long history of rheumatoid arthritis and osteoarthritis.   Is on MTX and jokingly reported having lots of heavy metal in her body.  Dr Leigh Aurora is her rheumatologist.   Also states there is progressive diplopia. 'just blinking causes diplopia '.  Gabrielle Duncan reported that initially she is she was concerned that she had strained her eyes and that there was more stress at work also related to [employer] with pandemic.  However her diplopia has progressed and she was referred to see an ophthalmologist who then referred back to the primary care physician and from there to me.  An MRI of the brain with and without contrast was just obtained late last months on 21 May 2019 and showed no gross abnormality she had a normal CBC with differential, her metabolic panel was normal there was no electrolyte abnormality she has elevated cholesterol but this could hardly explain diplopia.  She is not diabetic she has no history of thyroid disease vitamin D is low normal.  I did not see a blood test for myasthenia gravis.  Her last office visit with primary care was on 04 June 2019 the ophthalmology exam was consulted as unremarkable I do not have an original.  The patient has psoriatic arthritis osteoarthritis rheumatoid arthritis recurrent  major depressive disorder, hyperlipidemia vitamin D deficiency essential hypertension, vitamin B12 deficiency in the past, hematuria, she is of normal weight.   Family histiry , los her daughter 2 years ago to pneumonia before her wedding!    Review of Systems: Out of a complete 14 system review, the patient complains of only the following symptoms, and all other reviewed systems are negative. Diplopia. Walking into things.   MRI was ordered by ophthalmology - [facility]. Dr Langley Adie, MD   Gabrielle Maxon, MD - 04/30/2019 7:45 AM EDT Diplopia/Intermittent esotropia, non-comitant and variable: the patient states that her double vision is new, and has noticed this symptom for about two months. She has to close her eyes and refocus to fuse, and she particularly has trouble when driving. Since this is a new neurologic symptom, I suggested that we obtain a cranial MRI. We will schedule this test. If the test is normal, we will ask for a neurology consultation to rule out myasthenia gravis. At any rate, she will return here in three months to monitor her strabismus.  Dermatochalasis/Lower lid ectropion: the patient definitely has dry eye symptoms. I advised her to continue her artificial tear drops for now. Once we clear her from a neurologic standpoint, lower lid tightening to improve her ectropion may be helpful.   Electronically signed by Gabrielle Maxon, MD at 04/30/2019 9:42 AM EDT    Social History   Socioeconomic History  . Marital status: Divorced  Spouse name: Not on file  . Number of children: 2  . Years of education: Not on file  . Highest education level: Not on file  Occupational History  . Not on file  Social Needs  . Financial resource strain: Not on file  . Food insecurity    Worry: Not on file    Inability: Not on file  . Transportation needs    Medical: Not on file    Non-medical: Not on file  Tobacco Use  . Smoking status: Never Smoker  . Smokeless tobacco: Never  Used  Substance and Sexual Activity  . Alcohol use: No  . Drug use: No  . Sexual activity: Not Currently  Lifestyle  . Physical activity    Days per week: 2 days    Minutes per session: 60 min  . Stress: Only a little  Relationships  . Social Herbalist on phone: Not on file    Gets together: Not on file    Attends religious service: Not on file    Active member of club or organization: Not on file    Attends meetings of clubs or organizations: Not on file    Relationship status: Not on file  . Intimate partner violence    Fear of current or ex partner: Not on file    Emotionally abused: Not on file    Physically abused: Not on file    Forced sexual activity: Not on file  Other Topics Concern  . Not on file  Social History Narrative  . Not on file    Family History  Problem Relation Age of Onset  . Dementia Mother   . CAD Mother 74       triple bypass  . Pancreatitis Father   . Diabetes Father   . Heart disease Father   . Thyroid disease Maternal Grandmother   . Lung cancer Maternal Grandfather 48       smoker  . Ovarian cancer Paternal Grandmother 15  . Cancer Paternal Grandfather        Unknown origin with mets  . Breast cancer Cousin   . Breast cancer Cousin   . Breast cancer Maternal Aunt        x3  . Asthma Daughter     Past Medical History:  Diagnosis Date  . Allergy   . Left thyroid nodule 03/31/2013   Benign per thyroid biopsy in 2014  . Mass of chest wall 12/2012   tender to the touch  . Osteoarthritis   . Seasonal allergies     Past Surgical History:  Procedure Laterality Date  . ABDOMINAL HYSTERECTOMY  1997   complete  . ANTERIOR CERVICAL DECOMP/DISCECTOMY FUSION  08/08/2012   Procedure: ANTERIOR CERVICAL DECOMPRESSION/DISCECTOMY FUSION 2 LEVEL/HARDWARE REMOVAL;  Surgeon: Eustace Moore, MD;  Location: [facility];  Service: Neurosurgery;  Laterality: N/A;  anterior cervical five-six to six seven decompression fusion with removal  plate four-five  . CERVICAL FUSION  2005  . CHOLECYSTECTOMY  1997  . EYE SURGERY     for dry eye syndrome  . HEMORRHOID SURGERY N/A 09/02/2013   Procedure: HEMORRHOIDECTOMY;  Surgeon: Joyice Faster. Cornett, MD;  Location: [facility];  Service: General;  Laterality: N/A;  . LUMBAR FUSION  01/01/2013   L4-5  . LUMBAR LAMINECTOMY/DECOMPRESSION MICRODISCECTOMY  01/01/2013   L4-5  . MASS EXCISION N/A 01/29/2013   Procedure: EXCISION of chest wall mass 8 cm;  Surgeon: Stark Klein, MD;  Location: [facility];  Service: General;  Laterality: N/A;  . REDUCTION MAMMAPLASTY Bilateral   . SHOULDER SURGERY  03/2012   debridement and partial clavical removal  . TOTAL SHOULDER ARTHROPLASTY  09/24/2012   Procedure: TOTAL SHOULDER ARTHROPLASTY;  Surgeon: Johnny Bridge, MD;  Location: [facility];  Service: Orthopedics;  Laterality: Right;    Current Outpatient Medications  Medication Sig Dispense Refill  . acetaminophen (TYLENOL) 500 MG tablet Take by mouth every 6 (six) hours as needed.    Marland Kitchen buPROPion (WELLBUTRIN XL) 300 MG 24 hr tablet TAKE 1 TABLET BY MOUTH EVERY DAY IN THE MORNING 90 tablet 1  . folic acid (FOLVITE) 1 MG tablet Take 1 mg by mouth daily.    . methotrexate (RHEUMATREX) 10 MG tablet Take 10 mg by mouth. Caution: Chemotherapy. Protect from light. Takes 12 tablets a week.    . ustekinumab (STELARA) 45 MG/0.5ML SOSY injection Inject 45 mg into the skin. Injects once every quarter    . Vitamin D, Ergocalciferol, (DRISDOL) 1.25 MG (50000 UT) CAPS capsule TAKE ONE CAPSULE BY MOUTH THREE TIMES WEEKLY FOR DEFICIENCY 36 capsule 1   No current facility-administered medications for this visit.     Allergies as of 06/11/2019  . (No Known Allergies)    Vitals: BP 121/83   Pulse 75   Ht 5\' 4"  (1.626 m)   Wt 156 lb (70.8 kg)   BMI 26.78 kg/m  Last Weight:  Wt Readings from Last 1 Encounters:  06/11/19 156 lb (70.8 kg)   Last Height:   Ht Readings from Last 1  Encounters:  06/11/19 5\' 4"  (1.626 m)    Physical exam:  General: The patient is awake, alert and appears not in acute distress. The patient is well groomed. Head: Normocephalic, atraumatic. Neck is supple.   Cardiovascular:  Regular rate and rhythm, without  murmurs or carotid bruit, and without distended neck veins. Respiratory: Lungs are clear to auscultation. Skin:  Without evidence of edema, or rash Trunk: BMI is high normal for age.  Neurologic exam : The patient is awake and alert, oriented to place and time.  Memory subjective  described as intact. There is a normal attention span & concentration ability. Speech is fluent without dysarthria, but more dysphonia - she has aphasia.  Mood and affect are appropriate.  Cranial nerves: Pupils are equal and briskly reactive to light. Funduscopic exam deferred.   Extraocular movements  in vertical and horizontal planes highly abnormal- Her eye converge with upward gaze, and with downward gaze , too. Looking to the right her left eye " got stuck"  In a nasal position.  Visual fields by finger perimetry are limited  Peripheral vision loss.  Hearing to finger rub intact.   Facial sensation intact to fine touch. Facial motor strength is symmetric but  she has ptosis. Her  tongue and uvula move midline.  Tongue protrusion into either cheek is weak-. Shoulder shrug is normal. No fasciculation.   Motor exam:  Normal tone ,muscle bulk and symmetric strength in all extremities. She has motor strength loss through grip strength, not in major muscle groups.   Sensory:  Fine touch, pinprick and vibration were tested in all extremities. Proprioception was normal.  Coordination: Rapid alternating movements in the fingers/hands were normal. Finger-to-nose maneuver  normal without evidence of ataxia, dysmetria or tremor. Gait and station: Patient walks without assistive device  Deep tendon reflexes: in the upper and lower extremities are symmetric and  intact.  Assessment:  After physical and neurologic examination, review of laboratory studies, imaging, neurophysiology testing and pre-existing records, assessment is that of :   This is an unusual diplopia case, both eyes have abnormal motion and ptosis. And her visual acuity is poor.   Plan:  Treatment plan and additional workup :  I need to look at the MRI original image . I also will request the ophthalmologist consultation. I will order MG lab - and a NCV/ EMG.    If MG antibodies are positive, will follow with chest CT with contrast and for possible thymectomy.   RV in 2-3 weeks.   Asencion Partridge Trysten Berti MD 06/11/2019

## 2019-06-11 NOTE — Addendum Note (Signed)
Addended by: Chancy Hurter on: 06/11/2019 09:24 AM   Modules accepted: Orders

## 2019-06-11 NOTE — Addendum Note (Signed)
Addended by: Chancy Hurter on: 06/11/2019 02:48 PM   Modules accepted: Orders

## 2019-06-16 ENCOUNTER — Encounter: Payer: Self-pay | Admitting: Neurology

## 2019-06-16 LAB — PROTEIN ELECTROPHORESIS, SERUM
A/G Ratio: 1.4 (ref 0.7–1.7)
Albumin ELP: 4 g/dL (ref 2.9–4.4)
Alpha 1: 0.2 g/dL (ref 0.0–0.4)
Alpha 2: 0.9 g/dL (ref 0.4–1.0)
Beta: 1 g/dL (ref 0.7–1.3)
Gamma Globulin: 0.6 g/dL (ref 0.4–1.8)
Globulin, Total: 2.8 g/dL (ref 2.2–3.9)
Total Protein: 6.8 g/dL (ref 6.0–8.5)

## 2019-06-16 LAB — MYASTHENIA GRAVIS PANEL 2
AChR Binding Ab, Serum: <0.03 nmol/L (ref 0.00–0.24)
Anti-striation Abs: NEGATIVE

## 2019-06-16 NOTE — Addendum Note (Signed)
Addended by: Larey Seat on: 06/16/2019 05:10 PM   Modules accepted: Orders

## 2019-06-18 ENCOUNTER — Telehealth: Payer: Self-pay | Admitting: Neurology

## 2019-06-18 NOTE — Telephone Encounter (Signed)
Patient returned my call. no to the covid questions MR Brain w/wo contrast Dr. Silver Huguenin AuthLC:[id number] (exp. 06/17/19 to 12/14/19). Patient is scheduled at [facility] for 06/24/19.

## 2019-06-18 NOTE — Telephone Encounter (Signed)
LVM for pt to call back about scheduling MRI  Novella Rob: [id number] (exp. 06/17/19 to 12/14/19)

## 2019-06-19 ENCOUNTER — Encounter: Payer: Self-pay | Admitting: Neurology

## 2019-06-24 ENCOUNTER — Ambulatory Visit: Payer: Managed Care, Other (non HMO)

## 2019-06-24 ENCOUNTER — Other Ambulatory Visit: Payer: Self-pay

## 2019-06-24 DIAGNOSIS — H532 Diplopia: Secondary | ICD-10-CM | POA: Diagnosis not present

## 2019-06-24 DIAGNOSIS — L4050 Arthropathic psoriasis, unspecified: Secondary | ICD-10-CM | POA: Diagnosis not present

## 2019-06-24 DIAGNOSIS — H5000 Unspecified esotropia: Secondary | ICD-10-CM

## 2019-06-24 MED ORDER — GADOBENATE DIMEGLUMINE 529 MG/ML IV SOLN
15.0000 mL | Freq: Once | INTRAVENOUS | Status: AC | PRN
  Administered 2019-06-24: 15 mL via INTRAVENOUS

## 2019-06-25 ENCOUNTER — Other Ambulatory Visit: Payer: Self-pay | Admitting: Adult Health

## 2019-06-25 DIAGNOSIS — Z1231 Encounter for screening mammogram for malignant neoplasm of breast: Secondary | ICD-10-CM

## 2019-06-26 ENCOUNTER — Encounter: Payer: Self-pay | Admitting: Neurology

## 2019-06-26 ENCOUNTER — Other Ambulatory Visit: Payer: Self-pay | Admitting: Neurology

## 2019-06-26 ENCOUNTER — Other Ambulatory Visit: Payer: Self-pay

## 2019-06-26 ENCOUNTER — Ambulatory Visit (INDEPENDENT_AMBULATORY_CARE_PROVIDER_SITE_OTHER): Payer: Managed Care, Other (non HMO) | Admitting: Neurology

## 2019-06-26 VITALS — BP 118/82 | HR 95 | Temp 97.3°F | Ht 64.0 in | Wt 156.0 lb

## 2019-06-26 DIAGNOSIS — L4050 Arthropathic psoriasis, unspecified: Secondary | ICD-10-CM

## 2019-06-26 DIAGNOSIS — H532 Diplopia: Secondary | ICD-10-CM

## 2019-06-26 DIAGNOSIS — H5043 Accommodative component in esotropia: Secondary | ICD-10-CM | POA: Diagnosis not present

## 2019-06-26 DIAGNOSIS — H5000 Unspecified esotropia: Secondary | ICD-10-CM

## 2019-06-26 NOTE — Addendum Note (Signed)
Addended by: Inis Sizer D on: 06/26/2019 10:08 AM   Modules accepted: Orders

## 2019-06-26 NOTE — Patient Instructions (Addendum)
I am looking into serology for cat scratch fever- you have many cats and strays. Also CSF JC-Virus as you take a monoclonal antibody for rheumatic disorder.  You need to keep the appointment with Dr Hassell Done. We ordered a lumbar puncture - Fluoroscopy guided because of your back surgery history.

## 2019-06-26 NOTE — Addendum Note (Signed)
Addended by: Larey Seat on: 06/26/2019 12:05 PM   Modules accepted: Orders

## 2019-06-26 NOTE — Progress Notes (Addendum)
Provider:  Larey Seat, M D  Referring Provider: Unk Pinto, MD Primary Care Physician:  Unk Pinto, MD  Chief Complaint  Patient presents with  . Follow-up    pt alone, rm 10. pt here to discuss MRI results, labs      Revisit for Gabrielle Duncan on 06-26-2019, I have the pleasure of seeing Gabrielle Duncan today, whom I have last encountered 2-1/2 weeks ago in initial consultation that was baffling.  In the meantime I have repeated the patient's MRI brain with and without contrast as I could not get a report of her original and could not review the images either.  Dr. Leta Baptist interpreted the study on 06-25-2019 and stating that there were a few scattered foci of nonspecific specific gliosis in the subcortical white matter.  These are expected signs of aging they are not acute they do not state any kind of significant finding and most of all did not explain the patient's symptoms.  She does not seem to have had strokes, no bleeds no tumor no inflammation or infection.  Further I ordered a protein electrophoresis the specimen was collected the day of her last visit 06/11/1999 2020 and resulted 5 days later and showed no antibody or protein spike.  Especially no evidence of myasthenia.  The patient also saw upon referral Dr. Katy Fitch, MD, ophthalmologist on [facility]. He also stated he could not identify a lesion or process that would have caused her abnormal eye-movements, new onset strabism. I will refer to Neuro-ophthalmology, Dr Sanda Klein - but will ask explicitly to put me on the CC list.    Today's visit is addressing gait ataxia and memory .    HPI:  Gabrielle Duncan is a 63 y.o.- female, right- handed patient and self employed Engineer, maintenance (IT)- is seen here today 06-11-2019  Upon referral from Dr. Melford Aase for falls and diplopia. She is very busy with her [employer] office work, and staff has worked from home. She has bean walking into walls, and not finding relief with reading glasses- There is a long  history of rheumatoid arthritis and osteoarthritis.   Is on MTX and jokingly reported having lots of heavy metal in her body.  Dr Leigh Aurora is her rheumatologist.   Also states there is progressive diplopia. 'just blinking causes diplopia '.  Gabrielle Duncan reported that initially she is she was concerned that she had strained her eyes and that there was more stress at work also related to [place] with pandemic.  However her diplopia has progressed and she was referred to see an ophthalmologist who then referred back to the primary care physician and from there to me.  An MRI of the brain with and without contrast was just obtained late last months on 21 May 2019 and showed no gross abnormality she had a normal CBC with differential, her metabolic panel was normal there was no electrolyte abnormality she has elevated cholesterol but this could hardly explain diplopia.  She is not diabetic/ she has no history of thyroid disease /vitamin D is low normal.   I did not see a blood test for myasthenia gravis.  Her last office visit with primary care was on 04 June 2019 the ophthalmology exam was consulted as unremarkable- I do not have an original note/ lab result  The patient has psoriatic arthritis osteoarthritis rheumatoid arthritis recurrent major depressive disorder, hyperlipidemia vitamin D deficiency essential hypertension, vitamin B12 deficiency in the past, hematuria, she is of normal weight.  Social/Family history ,she  lost her daughter 2 years ago to pneumonia weeks before her wedding! Had prolonged grief reaction. Arthritis medication , no history of smoking, drinking, recreational drug use. Full time employed.    Review of Systems: Out of a complete 14 system review, the patient complains of only the following symptoms, and all other reviewed systems are negative. Diplopia. Walking into things.   MRI was ordered by ophthalmology - [facility]. Dr Langley Adie, MD   Venetia Maxon, MD -  04/30/2019 7:45 AM EDT Diplopia/Intermittent esotropia, non-comitant and variable: the patient states that her double vision is new, and has noticed this symptom for about two months. She has to close her eyes and refocus to fuse, and she particularly has trouble when driving. Since this is a new neurologic symptom, I suggested that we obtain a cranial MRI. We will schedule this test. If the test is normal, we will ask for a neurology consultation to rule out myasthenia gravis. At any rate, she will return here in three months to monitor her strabismus.  Dermatochalasis/Lower lid ectropion: the patient definitely has dry eye symptoms. I advised her to continue her artificial tear drops for now. Once we clear her from a neurologic standpoint, lower lid tightening to improve her ectropion may be helpful.   Electronically signed by Venetia Maxon, MD at 04/30/2019 9:42 AM EDT    Social History   Socioeconomic History  . Marital status: Divorced    Spouse name: Not on file  . Number of children: 2  . Years of education: Not on file  . Highest education level: Not on file  Occupational History  . Not on file  Social Needs  . Financial resource strain: Not on file  . Food insecurity    Worry: Not on file    Inability: Not on file  . Transportation needs    Medical: Not on file    Non-medical: Not on file  Tobacco Use  . Smoking status: Never Smoker  . Smokeless tobacco: Never Used  Substance and Sexual Activity  . Alcohol use: No  . Drug use: No  . Sexual activity: Not Currently  Lifestyle  . Physical activity    Days per week: 2 days    Minutes per session: 60 min  . Stress: Only a little  Relationships  . Social Herbalist on phone: Not on file    Gets together: Not on file    Attends religious service: Not on file    Active member of club or organization: Not on file    Attends meetings of clubs or organizations: Not on file    Relationship status: Not on file   . Intimate partner violence    Fear of current or ex partner: Not on file    Emotionally abused: Not on file    Physically abused: Not on file    Forced sexual activity: Not on file  Other Topics Concern  . Not on file  Social History Narrative  . Not on file    Family History  Problem Relation Age of Onset  . Dementia Mother   . CAD Mother 26       triple bypass  . Pancreatitis Father   . Diabetes Father   . Heart disease Father   . Thyroid disease Maternal Grandmother   . Lung cancer Maternal Grandfather 48       smoker  . Ovarian cancer Paternal Grandmother 79  . Cancer Paternal Grandfather  Unknown origin with mets  . Breast cancer Cousin   . Breast cancer Cousin   . Breast cancer Maternal Aunt        x3  . Asthma Daughter     Past Medical History:  Diagnosis Date  . Allergy   . Left thyroid nodule 03/31/2013   Benign per thyroid biopsy in 2014  . Mass of chest wall 12/2012   tender to the touch  . Osteoarthritis   . Seasonal allergies     Past Surgical History:  Procedure Laterality Date  . ABDOMINAL HYSTERECTOMY  1997   complete  . ANTERIOR CERVICAL DECOMP/DISCECTOMY FUSION  08/08/2012   Procedure: ANTERIOR CERVICAL DECOMPRESSION/DISCECTOMY FUSION 2 LEVEL/HARDWARE REMOVAL;  Surgeon: Eustace Moore, MD;  Location: [place] [facility];  Service: Neurosurgery;  Laterality: N/A;  anterior cervical five-six to six seven decompression fusion with removal plate four-five  . CERVICAL FUSION  2005  . CHOLECYSTECTOMY  1997  . EYE SURGERY     for dry eye syndrome  . HEMORRHOID SURGERY N/A 09/02/2013   Procedure: HEMORRHOIDECTOMY;  Surgeon: Joyice Faster. Cornett, MD;  Location: [place];  Service: General;  Laterality: N/A;  . LUMBAR FUSION  01/01/2013   L4-5  . LUMBAR LAMINECTOMY/DECOMPRESSION MICRODISCECTOMY  01/01/2013   L4-5  . MASS EXCISION N/A 01/29/2013   Procedure: EXCISION of chest wall mass 8 cm;  Surgeon: Stark Klein, MD;  Location: [place];  Service: General;  Laterality: N/A;  . REDUCTION MAMMAPLASTY Bilateral   . SHOULDER SURGERY  03/2012   debridement and partial clavical removal  . TOTAL SHOULDER ARTHROPLASTY  09/24/2012   Procedure: TOTAL SHOULDER ARTHROPLASTY;  Surgeon: Johnny Bridge, MD;  Location: [facility];  Service: Orthopedics;  Laterality: Right;    Current Outpatient Medications  Medication Sig Dispense Refill  . acetaminophen (TYLENOL) 500 MG tablet Take by mouth every 6 (six) hours as needed.    Marland Kitchen buPROPion (WELLBUTRIN XL) 300 MG 24 hr tablet TAKE 1 TABLET BY MOUTH EVERY DAY IN THE MORNING 90 tablet 1  . folic acid (FOLVITE) 1 MG tablet Take 1 mg by mouth daily.    . methotrexate (RHEUMATREX) 10 MG tablet Take 10 mg by mouth. Caution: Chemotherapy. Protect from light. Takes 12 tablets a week.    . ustekinumab (STELARA) 45 MG/0.5ML SOSY injection Inject 45 mg into the skin. Injects once every quarter    . Vitamin D, Ergocalciferol, (DRISDOL) 1.25 MG (50000 UT) CAPS capsule TAKE ONE CAPSULE BY MOUTH THREE TIMES WEEKLY FOR DEFICIENCY 36 capsule 1   No current facility-administered medications for this visit.     Allergies as of 06/26/2019  . (No Known Allergies)   Dr Zenia Resides report:  Visual acuity on the right eye 20/30-1 [place] 20/40-1, 10 cc 20/30.  Left eye [place] 20/50 DCC 20/50-3, 10 cc 20/30-2.  Spherical right eye -0.25 left eye -1.45.  Dr. Girtha Rm stated that he was unable to accurately measure the extent of the deviation due to the erratic but seemingly purposeful eye movements.  Impression and plan double vision is negative blood test for myasthenia gravis.   Erratic abnormal eye movements may be purposeful/  Dr. Zenia Resides note stated he supported a second opinion from neuro-ophthalmologist Dr. Sanda Klein at [facility].    PS She has mild cataracts which have minimal impact on vision, she has a history of rheumatoid arthritis and is treated with Stelara, she has psoriatic arthritis.    Normal MRI, with  and without contrast.   Vitals: BP 118/82   Pulse 95   Temp (!) 97.3 F (36.3 C)   Ht 5\' 4"  (1.626 m)   Wt 156 lb (70.8 kg)   BMI 26.78 kg/m  Last Weight:  Wt Readings from Last 1 Encounters:  06/26/19 156 lb (70.8 kg)   Last Height:   Ht Readings from Last 1 Encounters:  06/26/19 5\' 4"  (1.626 m)    Physical exam:  General: The patient is awake, alert and appears not in acute distress. The patient is well groomed. Head: Normocephalic, atraumatic. Neck is supple.   Cardiovascular:  Regular rate and rhythm, without  murmurs or carotid bruit, and without distended neck veins. Respiratory: Lungs are clear to auscultation. Skin:  Without evidence of edema, or rash Trunk: BMI is high normal for age.  Neurologic exam : The patient is awake and alert, oriented to place and time.  Memory subjective  described as intact. There is a normal attention span & concentration ability. Speech is fluent without dysarthria, but more dysphonia - she has aphasia.  Mood and affect are appropriate.  Cranial nerves: since last visit , she reports no longer skewed diplopia but horizontal diplopia-   Pupils are equal and briskly reactive to light. Funduscopic exam deferred.   Extraocular movements  in vertical and horizontal planes highly abnormal- Her eye converge with upward gaze, and with downward gaze , too.  Downward gaze is associated with the right eye turning inwards. Lateral gaze cannot be maintained for long, her eyes correct back to the midline and switch back to the gaze position. Apparently weaker on the left eye.  Looking to the left her right eye " got stuck" in a medial/ nasal position, but she resumed baseline vision immediately. Visual fields by finger perimetry are limited -suspected peripheral vision loss. Diplopia is now horizontal.  Hearing to finger rub intact. Facial sensation intact to fine touch. Facial motor strength is symmetric but  she has ptosis. Her   tongue and uvula move midline.  Tongue protrusion into either cheek is weak. Shoulder shrug is normal. No fasciculations noted, no tic.  Motor exam:  Normal tone ,muscle bulk and symmetric strength in all extremities. She has motor strength loss through grip strength, not in major muscle groups.  Sensory:  Fine touch, pinprick and vibration were tested in all extremities. Proprioception was normal. Coordination: Rapid alternating movements in the fingers/hands were normal. Finger-to-nose maneuver  normal without evidence of ataxia, dysmetria or tremor. Gait and station: Patient walks without assistive device- 06-26-2019: she walks wide based and turns with 4 steps to the left, and notably unstable while turning. Upon returning to the exam room ( bare feet) there is now a limp on the left, dragging her left leg as we walk back. This was not present as we walked the initial 12 yards .   Deep tendon reflexes: in the upper and lower extremities are symmetric and intact. - negative babinski.   Assessment:  After physical and neurologic examination, review of laboratory studies, imaging, neurophysiology testing and pre-existing records, assessment is that of :   This is an unusual diplopia case, both eyes have abnormal motion and ptosis. And her visual acuity is poor. There is strabism, too. Weakness in abductor function, diplopia now horizontal/ not skewed.  pattern is irregular, repetitive testing brings on different eye movement responses.   Plan:  Treatment plan and additional workup :  I looked at the newly obtained  MRI original  images/ with and without contrast. Normal for age.  We discussed the ophthalmologist consultation.  No MG Ab, no plasma-phoresis abnormality. I will not need an EMG, single fiber . Plus : No history of primary neoplasm. Autoimmune patient treated with MAB- Gabrielle Duncan and also takes methotrexate, has always taken Folic acid with it. Will order JCV- Given she is using a  immune modifier. No evidence of PMLs on MRI-  Consider CSF testing for oligoclonal, protein and cells, culture.   ( I am looking into serology for cat scratch fever- patient cares for many cats and strays. JC-Virus as she takes a monoclonal antibody for rheumatic disorder).  If CSF testing is negative, I  would be unable to offer any further help- unless we can find some additional help with Dr Earlie Server exam.        RV in 2-3 weeks.   Asencion Partridge [place] Ca[place] MD 06/26/2019

## 2019-06-26 NOTE — Addendum Note (Signed)
Addended by: Inis Sizer D on: 06/26/2019 11:10 AM   Modules accepted: Orders

## 2019-06-30 ENCOUNTER — Encounter: Payer: Self-pay | Admitting: Neurology

## 2019-06-30 LAB — BARTONELLA ANTIBODY PANEL
B Quintana IgM: NEGATIVE titer
B henselae IgG: NEGATIVE titer
B henselae IgM: NEGATIVE titer
B quintana IgG: NEGATIVE titer

## 2019-06-30 LAB — B. BURGDORFI ANTIBODIES: Lyme IgG/IgM Ab: <0.91 {ISR} (ref 0.00–0.90)

## 2019-07-01 ENCOUNTER — Other Ambulatory Visit: Payer: Self-pay | Admitting: Adult Health

## 2019-07-01 ENCOUNTER — Encounter: Payer: Self-pay | Admitting: Neurology

## 2019-07-01 DIAGNOSIS — F329 Major depressive disorder, single episode, unspecified: Secondary | ICD-10-CM

## 2019-07-01 DIAGNOSIS — F32A Depression, unspecified: Secondary | ICD-10-CM

## 2019-07-07 ENCOUNTER — Other Ambulatory Visit: Payer: Self-pay | Admitting: Neurology

## 2019-07-07 NOTE — Progress Notes (Signed)
I am dictating a Cyndie Mull Virus antibody test for the patient Gabrielle Duncan, age 63, this specimen was collected on 26 June 2019.  The JCV antibody index was 0.27 a value of less than 0.2 is negative this would be an indeterminate range.  I suggest to continue with whatever treatment is necessary and repeat the JC virus antibody test in 6 months.  The patient had a negative Lyme, Cat scratch fever and MG panel.  Larey Seat, MD

## 2019-07-08 ENCOUNTER — Encounter: Payer: Self-pay | Admitting: Internal Medicine

## 2019-07-08 ENCOUNTER — Telehealth: Payer: Self-pay | Admitting: Neurology

## 2019-07-08 NOTE — Telephone Encounter (Signed)
  This is Dr. Larey Seat, MD ,  dictating the Cyndie Mull Virus result of an Ab test from 06-26-2019 , Result from 07-02-2019 at 7:30 PM.  Mrs. Dooley's JC virus antibody test was indeterminate, index value 0.27.  In combination with a normal MRI there is no evidence of PML or latent viral infection of the brain.   I have not found an explanation for her abnormal eye movemeens.  No follow-up CSF study is necessary.   Larey Seat, MD

## 2019-07-10 ENCOUNTER — Encounter: Payer: Self-pay | Admitting: Neurology

## 2019-07-10 NOTE — Telephone Encounter (Signed)
Sent Roberta a message to [facility] per Dr. Brett Fairy.

## 2019-09-02 ENCOUNTER — Other Ambulatory Visit: Payer: Self-pay | Admitting: Internal Medicine

## 2019-09-03 ENCOUNTER — Ambulatory Visit: Payer: 59

## 2019-09-03 ENCOUNTER — Other Ambulatory Visit: Payer: 59

## 2019-09-03 NOTE — Progress Notes (Deleted)
FOLLOW UP  Assessment and Plan:   Hypertension Well controlled at this time off of medications Monitor blood pressure at home; patient to call if consistently greater than 130/80 Continue DASH diet.   Reminder to go to the ER if any CP, SOB, nausea, dizziness, severe HA, changes vision/speech, left arm numbness and tingling and jaw pain.  Cholesterol Currently above goal; working on lifestyle Continue low cholesterol diet and exercise.  Check lipid panel.   Other abnormal glucose Recent A1Cs at goal Discussed diet/exercise, weight management  Defer A1C; check CMP  Overweight with co morbidities Long discussion about weight loss, diet, and exercise Recommended diet heavy in fruits and veggies and low in animal meats, cheeses, and dairy products, appropriate calorie intake Discussed ideal weight for height  Patient will work on maintaining weight through holidays Patient on phentermine with benefit and no SE, taking drug breaks; continue close follow up. Return in 3 months Will follow up in 3 months  Vitamin D Def Below goal at last visit;  continue supplementation to maintain goal of 70-100 Check Vit D level  Depression/anxiety Restart medications, follow up in 3 months or sooner if needed Lifestyle discussed: diet/exerise, sleep hygiene, stress management, hydration  Psoriatic arthritis Continue follow up with Dr. Amil Amen Continue current medications Monitor closely  Continue diet and meds as discussed. Further disposition pending results of labs. Discussed med's effects and SE's.   Over 30 minutes of exam, counseling, chart review, and critical decision making was performed.   Future Appointments  Date Time Provider [place]  09/05/2019  8:45 AM Liane Comber, NP [facility] None  11/04/2019  7:00 AM GI-BCG MM 3 GI-BCGMM GI-BREAST CE  11/04/2019  7:30 AM GI-[facility]G DX DEXA 1 GI-BCGDG GI-BREAST CE  02/02/2020  8:45 AM Liane Comber, NP [facility] None  06/03/2020  10:00 AM Liane Comber, NP [facility] None    ----------------------------------------------------------------------------------------------------------------------  HPI 63 y.o. female  presents for 3 month follow up on hypertension, cholesterol, glucose management, weight and vitamin D deficiency.   Diplopia and variable strabismus for 8-10 months, had negative MRI,  was ultimately referred to [facility] Dr. Neoma Laming & colleagues recommended stop high dose wellbutrin - some reports of accommodative dysfunction in higher doses***  She is on wellbutrin 300 mg daily for recent episode of major depression;  *** grief counseling regarding her daughter who died 2 years ago.  Work has also been very stressful.   She follows with Dr. Amil Amen for psoriatic arthritis, on MTX and stelara. She also follows with Dr. Sherley Bounds with [facility] for her back, and Dr. Mardelle Matte for her shoulder.  she is prescribed phentermine for weight loss. Has not been taking in the last 30 days. While on the medication they have lost 0 lbs since last visit. They deny palpitations, anxiety, trouble sleeping, elevated BP.   BMI is There is no height or weight on file to calculate BMI., she is working on diet and exercise. Wt Readings from Last 3 Encounters:  06/26/19 156 lb (70.8 kg)  06/11/19 156 lb (70.8 kg)  06/04/19 158 lb (71.7 kg)   Her blood pressure has been controlled at home, today their BP is    She does not workout. She denies chest pain, shortness of breath, dizziness.   She is not on cholesterol medication. Her cholesterol is not at goal. The cholesterol last visit was:   Lab Results  Component Value Date   CHOL 231 (H) 06/04/2019   HDL 54 06/04/2019   [place]  149 (H) 06/04/2019   TRIG 153 (H) 06/04/2019   CHOLHDL 4.3 06/04/2019    She has not been working on diet and exercise for glucose management, and denies increased appetite, nausea, paresthesia of the feet, polydipsia, polyuria and  visual disturbances. Last A1C in the office was:  Lab Results  Component Value Date   HGBA1C 5.6 06/04/2019   Patient is on Vitamin D supplement.   Lab Results  Component Value Date   VD25OH 40 06/04/2019        Current Medications:  Current Outpatient Medications on File Prior to Visit  Medication Sig  . acetaminophen (TYLENOL) 500 MG tablet Take by mouth every 6 (six) hours as needed.  Marland Kitchen buPROPion (WELLBUTRIN XL) 300 MG 24 hr tablet Take 1 tablet every Morning for Mood  . folic acid (FOLVITE) 1 MG tablet Take 1 mg by mouth daily.  . methotrexate (RHEUMATREX) 10 MG tablet Take 10 mg by mouth. Caution: Chemotherapy. Protect from light. Takes 12 tablets a week.  . ustekinumab (STELARA) 45 MG/0.5ML SOSY injection Inject 45 mg into the skin. Injects once every quarter  . Vitamin D, Ergocalciferol, (DRISDOL) 1.25 MG (50000 UT) CAPS capsule TAKE ONE CAPSULE BY MOUTH THREE TIMES WEEKLY FOR DEFICIENCY   No current facility-administered medications on file prior to visit.      Allergies: No Known Allergies   Medical History:  Past Medical History:  Diagnosis Date  . Allergy   . Left thyroid nodule 03/31/2013   Benign per thyroid biopsy in 2014  . Mass of chest wall 12/2012   tender to the touch  . Osteoarthritis   . Seasonal allergies    Family history- Reviewed and unchanged Social history- Reviewed and unchanged   Review of Systems:  Review of Systems  Constitutional: Negative for malaise/fatigue and weight loss.  HENT: Negative for hearing loss and tinnitus.   Eyes: Negative for blurred vision and double vision.  Respiratory: Negative for cough, shortness of breath and wheezing.   Cardiovascular: Negative for chest pain, palpitations, orthopnea, claudication and leg swelling.  Gastrointestinal: Negative for abdominal pain, blood in stool, constipation, diarrhea, heartburn, melena, nausea and vomiting.  Genitourinary: Negative.   Musculoskeletal: Negative for joint pain  and myalgias.  Skin: Negative for rash.  Neurological: Negative for dizziness, tingling, sensory change, weakness and headaches.  Endo/Heme/Allergies: Negative for polydipsia.  Psychiatric/Behavioral: Positive for depression. Negative for hallucinations, substance abuse and suicidal ideas. The patient is not nervous/anxious and does not have insomnia.   All other systems reviewed and are negative.    Physical Exam: There were no vitals taken for this visit. Wt Readings from Last 3 Encounters:  06/26/19 156 lb (70.8 kg)  06/11/19 156 lb (70.8 kg)  06/04/19 158 lb (71.7 kg)   General Appearance: Well nourished, in no apparent distress. Eyes: PERRLA, EOMs, conjunctiva no swelling or erythema Sinuses: No Frontal/maxillary tenderness ENT/Mouth: Ext aud canals clear, TMs without erythema, bulging. No erythema, swelling, or exudate on post pharynx.  Tonsils not swollen or erythematous. Hearing normal.  Neck: Supple, thyroid normal.  Respiratory: Respiratory effort normal, BS equal bilaterally without rales, rhonchi, wheezing or stridor.  Cardio: RRR with no MRGs. Brisk peripheral pulses without edema.  Abdomen: Soft, + BS.  Non tender, no guarding, rebound, hernias, masses. Lymphatics: Non tender without lymphadenopathy.  Musculoskeletal: Full ROM, 5/5 strength, Normal gait Skin: Warm, dry without rashes, lesions, ecchymosis.  Neuro: Cranial nerves intact. No cerebellar symptoms.  Psych: Awake and oriented X 3, tearful affect,  Insight and Judgment appropriate.    Izora Ribas, NP 7:31 AM [facility]

## 2019-09-05 ENCOUNTER — Ambulatory Visit: Payer: Managed Care, Other (non HMO) | Admitting: Adult Health

## 2019-10-08 ENCOUNTER — Other Ambulatory Visit: Payer: Self-pay | Admitting: Adult Health

## 2019-10-08 DIAGNOSIS — E559 Vitamin D deficiency, unspecified: Secondary | ICD-10-CM

## 2019-10-22 NOTE — Progress Notes (Deleted)
FOLLOW UP  Assessment and Plan:   Hypertension Well controlled at this time off of medications Monitor blood pressure at home; patient to call if consistently greater than 130/80 Continue DASH diet.   Reminder to go to the ER if any CP, SOB, nausea, dizziness, severe HA, changes vision/speech, left arm numbness and tingling and jaw pain.  Cholesterol Currently above goal; initiate statin if remains significantly elevated today LDL goal <100 Continue low cholesterol diet and exercise.  Check lipid panel.   Other abnormal glucose Recent A1Cs at goal Discussed diet/exercise, weight management  Defer A1C; check CMP  Overweight with co morbidities Long discussion about weight loss, diet, and exercise Recommended diet heavy in fruits and veggies and low in animal meats, cheeses, and dairy products, appropriate calorie intake Discussed ideal weight for height  *** Will follow up in 3 months  Vitamin D Def Below goal at last visit;  continue supplementation to maintain goal of 70-100 Check Vit D level  Depression/anxiety ***, follow up in 3 months or sooner if needed Lifestyle discussed: diet/exerise, sleep hygiene, stress management, hydration  Psoriatic arthritis Continue follow up with Dr. Amil Amen Continue current medications Monitor closely  Strabisbus/diplopia ***  Continue diet and meds as discussed. Further disposition pending results of labs. Discussed med's effects and SE's.   Over 30 minutes of exam, counseling, chart review, and critical decision making was performed.   Future Appointments  Date Time Provider [place]  10/24/2019  8:45 AM Liane Comber, NP [facility] None  11/04/2019  7:00 AM [facility] MM 3 [facility]  11/04/2019  7:30 AM [facility] DX DEXA 1 [facility]  02/02/2020  8:45 AM Liane Comber, NP [facility] None  06/03/2020 10:00 AM Liane Comber, NP [facility] None     ----------------------------------------------------------------------------------------------------------------------  HPI 64 y.o. female  presents for 3 month follow up on hypertension, cholesterol, glucose management, weight and vitamin D deficiency.   She follows with Dr. Amil Amen for psoriatic arthritis, on MTX and stelara. She also follows with Dr. Sherley Bounds with [facility] for her back, and Dr. Mardelle Matte for her shoulder.  She is on wellbutrin 300 mg daily *** for recent episode of major depression; unfortunately she ran out of medication 1 month ago and is tearful in the office today. She is starting grief counseling next week regarding her daughter who died 2 years ago. Work has also been very stressful.   She was worked up due to strabismus and reports of diplopia; MRI on 06/24/2019 showed - Few scattered foci of nonspecific gliosis in the subcortical white matter. There was concern for possible MS and referred to neurology Dr. Brett Fairy, had negative MG panel, normal burgdorfi, B12, bartonella, serum electrophoresis. She was referred to [facility] neuro/ophth Dr. Neoma Laming who recommended d/c myopic correction for distance, use OTC readers if needed, and to decrease or d/c high dose wellbutrin ***  BMI is There is no height or weight on file to calculate BMI., she is working on diet and exercise. Wt Readings from Last 3 Encounters:  06/26/19 156 lb (70.8 kg)  06/11/19 156 lb (70.8 kg)  06/04/19 158 lb (71.7 kg)   Her blood pressure has been controlled at home, today their BP is    She does not workout. She denies chest pain, shortness of breath, dizziness.   She is not on cholesterol medication. Her cholesterol is not at goal. Atypically elevated at last visit, opted to work on lifestyle and follows up today for recheck with plan to  initiate medication if remains significantly elevated. The cholesterol last visit was:   Lab Results  Component Value Date   CHOL 231 (H)  06/04/2019   HDL 54 06/04/2019   LDLCALC 149 (H) 06/04/2019   TRIG 153 (H) 06/04/2019   CHOLHDL 4.3 06/04/2019    She has not been working on diet and exercise for glucose management, and denies increased appetite, nausea, paresthesia of the feet, polydipsia, polyuria and visual disturbances. Last A1C in the office was:  Lab Results  Component Value Date   HGBA1C 5.6 06/04/2019   Patient is on Vitamin D supplement.   Lab Results  Component Value Date   VD25OH 40 06/04/2019        Current Medications:  Current Outpatient Medications on File Prior to Visit  Medication Sig  . acetaminophen (TYLENOL) 500 MG tablet Take by mouth every 6 (six) hours as needed.  Marland Kitchen buPROPion (WELLBUTRIN XL) 300 MG 24 hr tablet Take 1 tablet every Morning for Mood  . folic acid (FOLVITE) 1 MG tablet Take 1 mg by mouth daily.  . methotrexate (RHEUMATREX) 10 MG tablet Take 10 mg by mouth. Caution: Chemotherapy. Protect from light. Takes 12 tablets a week.  . ustekinumab (STELARA) 45 MG/0.5ML SOSY injection Inject 45 mg into the skin. Injects once every quarter  . Vitamin D, Ergocalciferol, (DRISDOL) 1.25 MG (50000 UT) CAPS capsule TAKE ONE CAPSULE BY MOUTH THREE TIMES WEEKLY FOR DEFICIENCY   No current facility-administered medications on file prior to visit.     Allergies: No Known Allergies   Medical History:  Past Medical History:  Diagnosis Date  . Allergy   . Left thyroid nodule 03/31/2013   Benign per thyroid biopsy in 2014  . Mass of chest wall 12/2012   tender to the touch  . Osteoarthritis   . Seasonal allergies    Family history- Reviewed and unchanged Social history- Reviewed and unchanged   Review of Systems: *** Review of Systems  Constitutional: Negative for malaise/fatigue and weight loss.  HENT: Negative for hearing loss and tinnitus.   Eyes: Negative for blurred vision and double vision.  Respiratory: Negative for cough, shortness of breath and wheezing.   Cardiovascular:  Negative for chest pain, palpitations, orthopnea, claudication and leg swelling.  Gastrointestinal: Negative for abdominal pain, blood in stool, constipation, diarrhea, heartburn, melena, nausea and vomiting.  Genitourinary: Negative.   Musculoskeletal: Negative for joint pain and myalgias.  Skin: Negative for rash.  Neurological: Negative for dizziness, tingling, sensory change, weakness and headaches.  Endo/Heme/Allergies: Negative for polydipsia.  Psychiatric/Behavioral: Positive for depression. Negative for hallucinations, substance abuse and suicidal ideas. The patient is not nervous/anxious and does not have insomnia.   All other systems reviewed and are negative.    Physical Exam: There were no vitals taken for this visit. Wt Readings from Last 3 Encounters:  06/26/19 156 lb (70.8 kg)  06/11/19 156 lb (70.8 kg)  06/04/19 158 lb (71.7 kg)   General Appearance: Well nourished, in no apparent distress. Eyes: PERRLA, EOMs, conjunctiva no swelling or erythema Sinuses: No Frontal/maxillary tenderness ENT/Mouth: Ext aud canals clear, TMs without erythema, bulging. No erythema, swelling, or exudate on post pharynx.  Tonsils not swollen or erythematous. Hearing normal.  Neck: Supple, thyroid normal.  Respiratory: Respiratory effort normal, BS equal bilaterally without rales, rhonchi, wheezing or stridor.  Cardio: RRR with no MRGs. Brisk peripheral pulses without edema.  Abdomen: Soft, + BS.  Non tender, no guarding, rebound, hernias, masses. Lymphatics: Non tender without lymphadenopathy.  Musculoskeletal: Full ROM, 5/5 strength, Normal gait Skin: Warm, dry without rashes, lesions, ecchymosis.  Neuro: Cranial nerves intact. No cerebellar symptoms.  Psych: Awake and oriented X 3, tearful affect, Insight and Judgment appropriate.    Izora Ribas, NP 1:23 PM [facility]

## 2019-10-24 ENCOUNTER — Ambulatory Visit: Payer: Managed Care, Other (non HMO) | Admitting: Adult Health

## 2019-10-30 ENCOUNTER — Other Ambulatory Visit: Payer: 59

## 2019-11-04 ENCOUNTER — Other Ambulatory Visit: Payer: Self-pay

## 2019-11-04 ENCOUNTER — Ambulatory Visit
Admission: RE | Admit: 2019-11-04 | Discharge: 2019-11-04 | Disposition: A | Discharge status: Home / Self Care | Payer: 59 | Source: Ambulatory Visit | Attending: Adult Health | Admitting: Adult Health

## 2019-11-04 ENCOUNTER — Ambulatory Visit
Admission: RE | Admit: 2019-11-04 | Discharge: 2019-11-04 | Disposition: A | Discharge status: Home / Self Care | Payer: Managed Care, Other (non HMO) | Source: Ambulatory Visit | Attending: Adult Health | Admitting: Adult Health

## 2019-11-04 DIAGNOSIS — E2839 Other primary ovarian failure: Secondary | ICD-10-CM

## 2019-11-04 DIAGNOSIS — Z1231 Encounter for screening mammogram for malignant neoplasm of breast: Secondary | ICD-10-CM

## 2019-11-05 ENCOUNTER — Other Ambulatory Visit: Payer: Self-pay | Admitting: Internal Medicine

## 2019-11-05 MED ORDER — ALENDRONATE SODIUM 70 MG PO TABS: 70.0000 mg | ORAL_TABLET | ORAL | 11 refills | Status: AC

## 2019-11-07 ENCOUNTER — Other Ambulatory Visit: Payer: Self-pay | Admitting: Adult Health

## 2019-11-07 ENCOUNTER — Encounter: Payer: Self-pay | Admitting: Adult Health

## 2019-11-07 DIAGNOSIS — M810 Age-related osteoporosis without current pathological fracture: Secondary | ICD-10-CM | POA: Insufficient documentation

## 2019-11-07 HISTORY — DX: Age-related osteoporosis without current pathological fracture: M81.0

## 2019-11-21 ENCOUNTER — Ambulatory Visit: Payer: Managed Care, Other (non HMO) | Admitting: Adult Health

## 2019-12-05 ENCOUNTER — Ambulatory Visit: Payer: Managed Care, Other (non HMO) | Admitting: Adult Health

## 2019-12-18 NOTE — Progress Notes (Signed)
FOLLOW UP  Assessment and Plan:   Hypertension Well controlled at this time off of medications Monitor blood pressure at home; patient to call if consistently greater than 130/80 Continue DASH diet.   Reminder to go to the ER if any CP, SOB, nausea, dizziness, severe HA, changes vision/speech, left arm numbness and tingling and jaw pain.  Cholesterol Currently above goal; working on lifestyle Continue low cholesterol diet and exercise.  Check lipid panel.   Other abnormal glucose Recent A1Cs at goal Discussed diet/exercise, weight management  Defer A1C; check CMP  Overweight with co morbidities Long discussion about weight loss, diet, and exercise Recommended diet heavy in fruits and veggies and low in animal meats, cheeses, and dairy products, appropriate calorie intake Discussed ideal weight for height  Patient will work on maintaining weight through holidays Will follow up in 3 months  Vitamin D Def Below goal at last visit; has increased dose continue supplementation to maintain goal of 70-100 Check Vit D level  Depression/anxiety Discussed lexapro; information provided; start 10 mg daily, follow up in 8-12 weeks Lifestyle discussed: diet/exerise, sleep hygiene, stress management, hydration  Psoriatic arthritis Continue follow up with Dr. 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 Further disposition pending results of labs. Discussed med's effects and SE's.   Over 30 minutes of exam, counseling, chart review, and critical decision making was performed.   Future Appointments  Date Time Provider [place]  02/02/2020  8:45 AM Liane Comber, NP [facility] None  06/03/2020 10:00 AM Liane Comber, NP [facility] None     ----------------------------------------------------------------------------------------------------------------------  HPI 64 y.o. female  presents for 6 month follow up on hypertension, cholesterol, glucose management, weight and vitamin D deficiency.   She follows with Dr. Amil Amen for psoriatic arthritis, on MTX and stelara. She also follows with Dr. Sherley Bounds with [facility] for her back, and Dr. Mardelle Matte for her shoulder. 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   She is on wellbutrin 300 mg daily for recent episode of major depression; She presented at last visit reporting persistent blurry vision, diplopia, had seen Dr. Katy Fitch who ordered MRI which was unremarkable, concern for MG though labs were negative; she was referred to neurology, and again to neuro/opth specialist at [facility] who felt high dose wellbutrin may have contributed, discontinued this. Patient states she cannot tell a difference since discontinuing this medication. Reports ongoing double vision, stumbling.   She reports mom's dementia is advancing, work has been very stressful. She is upbeat but tearful today.   BMI is Body mass index is 27.12 kg/m., she is working on diet and exercise. Wt Readings from Last 3 Encounters:  12/19/19 158 lb (71.7 kg)  06/26/19 156 lb (70.8 kg)  06/11/19 156 lb (70.8 kg)   Her blood pressure has been controlled at home, today their BP is BP: 116/70  She does not workout. She denies chest pain, shortness of breath, dizziness.   She is not on cholesterol medication. Her cholesterol is not at goal, atypically elevated at last visit, returns today for recheck. The cholesterol last visit was:   Lab Results  Component Value Date   CHOL 231 (H) 06/04/2019   HDL 54 06/04/2019   LDLCALC 149 (H) 06/04/2019   TRIG 153 (H) 06/04/2019   CHOLHDL 4.3  06/04/2019    She has not been working on diet and exercise  for glucose management, and denies increased appetite, nausea, paresthesia of the feet, polydipsia, polyuria and visual disturbances. Last A1C in the office was:  Lab Results  Component Value Date   HGBA1C 5.6 06/04/2019   Patient is on Vitamin D supplement, has increased to taking 50000 three days a week, but reports last month due to many covid 19 outbreaks in office she was doubling this dose for about 1 month.  Lab Results  Component Value Date   VD25OH 40 06/04/2019        Current Medications:  Current Outpatient Medications on File Prior to Visit  Medication Sig  . acetaminophen (TYLENOL) 500 MG tablet Take by mouth every 6 (six) hours as needed.  Marland Kitchen alendronate (FOSAMAX) 70 MG tablet Take 1 tablet (70 mg total) by mouth every 7 (seven) days. Take with a full glass of water on an empty stomach.  . folic acid (FOLVITE) 1 MG tablet Take 1 mg by mouth daily.  . methotrexate (RHEUMATREX) 10 MG tablet Take 10 mg by mouth. Caution: Chemotherapy. Protect from light. Takes 12 tablets a week.  . ustekinumab (STELARA) 45 MG/0.5ML SOSY injection Inject 45 mg into the skin. Injects once every quarter  . Vitamin D, Ergocalciferol, (DRISDOL) 1.25 MG (50000 UT) CAPS capsule TAKE ONE CAPSULE BY MOUTH THREE TIMES WEEKLY FOR DEFICIENCY   No current facility-administered medications on file prior to visit.     Allergies: No Known Allergies   Medical History:  Past Medical History:  Diagnosis Date  . Allergy   . Left thyroid nodule 03/31/2013   Benign per thyroid biopsy in 2014  . Mass of chest wall 12/2012   tender to the touch  . Osteoarthritis   . Seasonal allergies    Family history- Reviewed and unchanged Social history- Reviewed and unchanged   Review of Systems:  Review of Systems  Constitutional: Negative for malaise/fatigue and weight loss.  HENT: Negative for hearing loss and tinnitus.   Eyes: Positive for blurred vision, double vision and redness.       Dry eyes   Respiratory: Negative for cough, shortness of breath and wheezing.   Cardiovascular: Negative for chest pain, palpitations, orthopnea, claudication and leg swelling.  Gastrointestinal: Negative for abdominal pain, blood in stool, constipation, diarrhea, heartburn, melena, nausea and vomiting.  Genitourinary: Negative.   Musculoskeletal: Negative for joint pain and myalgias.  Skin: Negative for rash.  Neurological: Positive for focal weakness (eyes). Negative for dizziness, tingling, sensory change, weakness and headaches.  Endo/Heme/Allergies: Negative for polydipsia.  Psychiatric/Behavioral: Positive for depression. Negative for hallucinations, substance abuse and suicidal ideas. The patient is not nervous/anxious and does not have insomnia.   All other systems reviewed and are negative.    Physical Exam: BP 116/70   Pulse 73   Temp (!) 97.2 F (36.2 C)   Wt 158 lb (71.7 kg)   SpO2 97%   BMI 27.12 kg/m  Wt Readings from Last 3 Encounters:  12/19/19 158 lb (71.7 kg)  06/26/19 156 lb (70.8 kg)  06/11/19 156 lb (70.8 kg)   General Appearance: Well nourished, in no apparent distress. Eyes: PERRLA, EOMs abn (see neuro), conjunctiva appear mildly erythematous/irritated without discharge Sinuses: No Frontal/maxillary tenderness ENT/Mouth: Ext aud canals clear, TMs without erythema, bulging. No erythema, swelling, or exudate on post pharynx.  Tonsils not swollen or erythematous. Hearing normal.  Neck: Supple, thyroid normal.  Respiratory: Respiratory effort normal, BS  equal bilaterally without rales, rhonchi, wheezing or stridor.  Cardio: RRR with no MRGs. Brisk peripheral pulses without edema.  Abdomen: Soft, + BS.  Non tender, no guarding, rebound, hernias, masses. Lymphatics: Non tender without lymphadenopathy.  Musculoskeletal: Full ROM, 5/5 strength, Normal gait Skin: Warm, dry without rashes, lesions, ecchymosis.  Neuro: CN intact excepting R interior oblique overreaction with  strabismus; reflexes equal bilaterally. Normal muscle tone, no cerebellar symptoms. Sensation intact.  Psych: Awake and oriented X 3, tearful affect, Insight and Judgment appropriate.    Izora Ribas, NP 9:05 AM [facility]

## 2019-12-19 ENCOUNTER — Ambulatory Visit: Payer: Managed Care, Other (non HMO) | Admitting: Adult Health

## 2019-12-19 ENCOUNTER — Other Ambulatory Visit: Payer: Self-pay

## 2019-12-19 ENCOUNTER — Encounter: Payer: Self-pay | Admitting: Adult Health

## 2019-12-19 VITALS — BP 116/70 | HR 73 | Temp 97.2°F | Wt 158.0 lb

## 2019-12-19 DIAGNOSIS — Z79899 Other long term (current) drug therapy: Secondary | ICD-10-CM

## 2019-12-19 DIAGNOSIS — E663 Overweight: Secondary | ICD-10-CM

## 2019-12-19 DIAGNOSIS — E559 Vitamin D deficiency, unspecified: Secondary | ICD-10-CM

## 2019-12-19 DIAGNOSIS — M810 Age-related osteoporosis without current pathological fracture: Secondary | ICD-10-CM

## 2019-12-19 DIAGNOSIS — L4050 Arthropathic psoriasis, unspecified: Secondary | ICD-10-CM

## 2019-12-19 DIAGNOSIS — H5000 Unspecified esotropia: Secondary | ICD-10-CM | POA: Diagnosis not present

## 2019-12-19 DIAGNOSIS — E785 Hyperlipidemia, unspecified: Secondary | ICD-10-CM | POA: Diagnosis not present

## 2019-12-19 DIAGNOSIS — F3341 Major depressive disorder, recurrent, in partial remission: Secondary | ICD-10-CM

## 2019-12-19 DIAGNOSIS — I10 Essential (primary) hypertension: Secondary | ICD-10-CM | POA: Diagnosis not present

## 2019-12-19 DIAGNOSIS — R7309 Other abnormal glucose: Secondary | ICD-10-CM

## 2019-12-19 MED ORDER — ESCITALOPRAM OXALATE 10 MG PO TABS: 10.0000 mg | ORAL_TABLET | Freq: Every day | ORAL | 1 refills | Status: DC

## 2019-12-19 NOTE — Patient Instructions (Addendum)
Goals    . Blood Pressure < 130/80    . HEMOGLOBIN A1C < 5.6    . LDL CALC < 130      Escitalopram tablets What is this medicine? ESCITALOPRAM (es sye TAL oh pram) is used to treat depression and certain types of anxiety. This medicine may be used for other purposes; ask your health care provider or pharmacist if you have questions. COMMON BRAND NAME(S): Lexapro What should I tell my health care provider before I take this medicine? They need to know if you have any of these conditions:  bipolar disorder or a family history of bipolar disorder  diabetes  glaucoma  heart disease  kidney or liver disease  receiving electroconvulsive therapy  seizures (convulsions)  suicidal thoughts, plans, or attempt by you or a family member  an unusual or allergic reaction to escitalopram, the related drug citalopram, other medicines, foods, dyes, or preservatives  pregnant or trying to become pregnant  breast-feeding How should I use this medicine? Take this medicine by mouth with a glass of water. Follow the directions on the prescription label. You can take it with or without food. If it upsets your stomach, take it with food. Take your medicine at regular intervals. Do not take it more often than directed. Do not stop taking this medicine suddenly except upon the advice of your doctor. Stopping this medicine too quickly may cause serious side effects or your condition may worsen. A special MedGuide will be given to you by the pharmacist with each prescription and refill. Be sure to read this information carefully each time. Talk to your pediatrician regarding the use of this medicine in children. Special care may be needed. Overdosage: If you think you have taken too much of this medicine contact a poison control center or emergency room at once. NOTE: This medicine is only for you. Do not share this medicine with others. What if I miss a dose? If you miss a dose, take it as soon as you  can. If it is almost time for your next dose, take only that dose. Do not take double or extra doses. What may interact with this medicine? Do not take this medicine with any of the following medications:  certain medicines for fungal infections like fluconazole, itraconazole, ketoconazole, posaconazole, voriconazole  cisapride  citalopram  dronedarone  linezolid  MAOIs like Carbex, Eldepryl, Marplan, Nardil, and Parnate  methylene blue (injected into a vein)  pimozide  thioridazine This medicine may also interact with the following medications:  alcohol  amphetamines  aspirin and aspirin-like medicines  carbamazepine  certain medicines for depression, anxiety, or psychotic disturbances  certain medicines for migraine headache like almotriptan, eletriptan, frovatriptan, naratriptan, rizatriptan, sumatriptan, zolmitriptan  certain medicines for sleep  certain medicines that treat or prevent blood clots like warfarin, enoxaparin, dalteparin  cimetidine  diuretics  dofetilide  fentanyl  furazolidone  isoniazid  lithium  metoprolol  NSAIDs, medicines for pain and inflammation, like ibuprofen or naproxen  other medicines that prolong the QT interval (cause an abnormal heart rhythm)  procarbazine  rasagiline  supplements like St. John's wort, kava kava, valerian  tramadol  tryptophan  ziprasidone This list may not describe all possible interactions. Give your health care provider a list of all the medicines, herbs, non-prescription drugs, or dietary supplements you use. Also tell them if you smoke, drink alcohol, or use illegal drugs. Some items may interact with your medicine. What should I watch for while using this medicine? Tell  your doctor if your symptoms do not get better or if they get worse. Visit your doctor or health care professional for regular checks on your progress. Because it may take several weeks to see the full effects of this  medicine, it is important to continue your treatment as prescribed by your doctor. Patients and their families should watch out for new or worsening thoughts of suicide or depression. Also watch out for sudden changes in feelings such as feeling anxious, agitated, panicky, irritable, hostile, aggressive, impulsive, severely restless, overly excited and hyperactive, or not being able to sleep. If this happens, especially at the beginning of treatment or after a change in dose, call your health care professional. Dennis Bast may get drowsy or dizzy. Do not drive, use machinery, or do anything that needs mental alertness until you know how this medicine affects you. Do not stand or sit up quickly, especially if you are an older patient. This reduces the risk of dizzy or fainting spells. Alcohol may interfere with the effect of this medicine. Avoid alcoholic drinks. Your mouth may get dry. Chewing sugarless gum or sucking hard candy, and drinking plenty of water may help. Contact your doctor if the problem does not go away or is severe. What side effects may I notice from receiving this medicine? Side effects that you should report to your doctor or health care professional as soon as possible:  allergic reactions like skin rash, itching or hives, swelling of the face, lips, or tongue  anxious  black, tarry stools  changes in vision  confusion  elevated mood, decreased need for sleep, racing thoughts, impulsive behavior  eye pain  fast, irregular heartbeat  feeling faint or lightheaded, falls  feeling agitated, angry, or irritable  hallucination, loss of contact with reality  loss of balance or coordination  loss of memory  painful or prolonged erections  restlessness, pacing, inability to keep still  seizures  stiff muscles  suicidal thoughts or other mood changes  trouble sleeping  unusual bleeding or bruising  unusually weak or tired  vomiting Side effects that usually do not  require medical attention (report to your doctor or health care professional if they continue or are bothersome):  changes in appetite  change in sex drive or performance  headache  increased sweating  indigestion, nausea  tremors This list may not describe all possible side effects. Call your doctor for medical advice about side effects. You may report side effects to FDA at 1-800-FDA-1088. Where should I keep my medicine? Keep out of reach of children. Store at room temperature between 15 and 30 degrees C (59 and 86 degrees F). Throw away any unused medicine after the expiration date. NOTE: This sheet is a summary. It may not cover all possible information. If you have questions about this medicine, talk to your doctor, pharmacist, or health care provider.  2020 Elsevier/Gold Standard (2018-09-09 11:21:44)    Rosuvastatin Tablets What is this medicine? ROSUVASTATIN (roe SOO va sta tin) is known as a HMG-CoA reductase inhibitor or 'statin'. It lowers cholesterol and triglycerides in the blood. This drug may also reduce the risk of heart attack, stroke, or other health problems in patients with risk factors for heart disease. Diet and lifestyle changes are often used with this drug. This medicine may be used for other purposes; ask your health care provider or pharmacist if you have questions. COMMON BRAND NAME(S): Crestor What should I tell my health care provider before I take this medicine? They need  to know if you have any of these conditions:  diabetes  if you often drink alcohol  history of stroke  kidney disease  liver disease  muscle aches or weakness  thyroid disease  an unusual or allergic reaction to rosuvastatin, other medicines, foods, dyes, or preservatives  pregnant or trying to get pregnant  breast-feeding How should I use this medicine? Take this medicine by mouth with a glass of water. Follow the directions on the prescription label. Do not cut,  crush or chew this medicine. You can take this medicine with or without food. Take your doses at regular intervals. Do not take your medicine more often than directed. Talk to your pediatrician regarding the use of this medicine in children. While this drug may be prescribed for children as young as 73 years old for selected conditions, precautions do apply. Overdosage: If you think you have taken too much of this medicine contact a poison control center or emergency room at once. NOTE: This medicine is only for you. Do not share this medicine with others. What if I miss a dose? If you miss a dose, take it as soon as you can. If your next dose is to be taken in less than 12 hours, then do not take the missed dose. Take the next dose at your regular time. Do not take double or extra doses. What may interact with this medicine? Do not take this medicine with any of the following medications:  herbal medicines like red yeast rice This medicine may also interact with the following medications:  alcohol  antacids containing aluminum hydroxide or magnesium hydroxide  cyclosporine  other medicines for high cholesterol  some medicines for HIV infection  warfarin This list may not describe all possible interactions. Give your health care provider a list of all the medicines, herbs, non-prescription drugs, or dietary supplements you use. Also tell them if you smoke, drink alcohol, or use illegal drugs. Some items may interact with your medicine. What should I watch for while using this medicine? Visit your doctor or health care professional for regular check-ups. You may need regular tests to make sure your liver is working properly. Your health care professional may tell you to stop taking this medicine if you develop muscle problems. If your muscle problems do not go away after stopping this medicine, contact your health care professional. Do not become pregnant while taking this medicine. Women  should inform their health care professional if they wish to become pregnant or think they might be pregnant. There is a potential for serious side effects to an unborn child. Talk to your health care professional or pharmacist for more information. Do not breast-feed an infant while taking this medicine. This medicine may increase blood sugar. Ask your healthcare provider if changes in diet or medicines are needed if you have diabetes. If you are going to need surgery or other procedure, tell your doctor that you are using this medicine. This drug is only part of a total heart-health program. Your doctor or a dietician can suggest a low-cholesterol and low-fat diet to help. Avoid alcohol and smoking, and keep a proper exercise schedule. This medicine may cause a decrease in Co-Enzyme Q-10. You should make sure that you get enough Co-Enzyme Q-10 while you are taking this medicine. Discuss the foods you eat and the vitamins you take with your health care professional. What side effects may I notice from receiving this medicine? Side effects that you should report to your doctor  or health care professional as soon as possible:  allergic reactions like skin rash, itching or hives, swelling of the face, lips, or tongue  confusion  joint pain  loss of memory  redness, blistering, peeling or loosening of the skin, including inside the mouth  signs and symptoms of high blood sugar such as being more thirsty or hungry or having to urinate more than normal. You may also feel very tired or have blurry vision.  signs and symptoms of muscle injury like dark urine; trouble passing urine or change in the amount of urine; unusually weak or tired; muscle pain or side or back pain  yellowing of the eyes or skin Side effects that usually do not require medical attention (report to your doctor or health care professional if they continue or are  bothersome):  constipation  diarrhea  dizziness  gas  headache  nausea  stomach pain  trouble sleeping  upset stomach This list may not describe all possible side effects. Call your doctor for medical advice about side effects. You may report side effects to FDA at 1-800-FDA-1088. Where should I keep my medicine? Keep out of the reach of children. Store at room temperature between 20 and 25 degrees C (68 and 77 degrees F). Keep container tightly closed (protect from moisture). Throw away any unused medicine after the expiration date. NOTE: This sheet is a summary. It may not cover all possible information. If you have questions about this medicine, talk to your doctor, pharmacist, or health care provider.  2020 Elsevier/Gold Standard (2018-07-11 08:25:08)

## 2019-12-20 ENCOUNTER — Other Ambulatory Visit: Payer: Self-pay | Admitting: Adult Health

## 2019-12-20 LAB — LIPID PANEL
Cholesterol: 194 mg/dL (ref <200)
HDL: 51 mg/dL (ref >=50)
LDL Cholesterol (Calc): 121 mg/dL (calc) — ABNORMAL HIGH
Non-HDL Cholesterol (Calc): 143 mg/dL (calc) — ABNORMAL HIGH (ref <130)
Total CHOL/HDL Ratio: 3.8 (calc) (ref <5.0)
Triglycerides: 108 mg/dL (ref <150)

## 2019-12-20 LAB — CBC WITH DIFFERENTIAL/PLATELET
Absolute Monocytes: 390 cells/uL (ref 200–950)
Basophils Absolute: 42 cells/uL (ref 0–200)
Basophils Relative: 0.8 %
Eosinophils Absolute: 312 cells/uL (ref 15–500)
Eosinophils Relative: 6 %
HCT: 37.9 % (ref 35.0–45.0)
Hemoglobin: 12.9 g/dL (ref 11.7–15.5)
Lymphs Abs: 1602 cells/uL (ref 850–3900)
MCH: 31.4 pg (ref 27.0–33.0)
MCHC: 34 g/dL (ref 32.0–36.0)
MCV: 92.2 fL (ref 80.0–100.0)
MPV: 11.5 fL (ref 7.5–12.5)
Monocytes Relative: 7.5 %
Neutro Abs: 2855 cells/uL (ref 1500–7800)
Neutrophils Relative %: 54.9 %
Platelets: 257 10*3/uL (ref 140–400)
RBC: 4.11 10*6/uL (ref 3.80–5.10)
RDW: 12.1 % (ref 11.0–15.0)
Total Lymphocyte: 30.8 %
WBC: 5.2 10*3/uL (ref 3.8–10.8)

## 2019-12-20 LAB — COMPLETE METABOLIC PANEL WITH GFR
AG Ratio: 2.1 (calc) (ref 1.0–2.5)
ALT: 13 U/L (ref 6–29)
AST: 13 U/L (ref 10–35)
Albumin: 4.1 g/dL (ref 3.6–5.1)
Alkaline phosphatase (APISO): 65 U/L (ref 37–153)
BUN: 16 mg/dL (ref 7–25)
CO2: 27 mmol/L (ref 20–32)
Calcium: 9 mg/dL (ref 8.6–10.4)
Chloride: 105 mmol/L (ref 98–110)
Creat: 0.73 mg/dL (ref 0.50–0.99)
GFR, Est African American: 100 mL/min/{1.73_m2} (ref >=60)
GFR, Est Non African American: 86 mL/min/{1.73_m2} (ref >=60)
Globulin: 2 g/dL (calc) (ref 1.9–3.7)
Glucose, Bld: 101 mg/dL — ABNORMAL HIGH (ref 65–99)
Potassium: 4.5 mmol/L (ref 3.5–5.3)
Sodium: 139 mmol/L (ref 135–146)
Total Bilirubin: 0.3 mg/dL (ref 0.2–1.2)
Total Protein: 6.1 g/dL (ref 6.1–8.1)

## 2019-12-20 LAB — VITAMIN D 25 HYDROXY (VIT D DEFICIENCY, FRACTURES): Vit D, 25-Hydroxy: 77 ng/mL (ref 30–100)

## 2019-12-20 LAB — TSH: TSH: 1.18 mIU/L (ref 0.40–4.50)

## 2019-12-20 LAB — MAGNESIUM: Magnesium: 2.1 mg/dL (ref 1.5–2.5)

## 2019-12-20 MED ORDER — PRAVASTATIN SODIUM 20 MG PO TABS: 20.0000 mg | ORAL_TABLET | Freq: Every day | ORAL | 1 refills | Status: DC

## 2020-01-21 ENCOUNTER — Other Ambulatory Visit: Payer: Self-pay | Admitting: Orthopedic Surgery

## 2020-01-21 DIAGNOSIS — M25512 Pain in left shoulder: Secondary | ICD-10-CM

## 2020-02-02 ENCOUNTER — Ambulatory Visit: Payer: Managed Care, Other (non HMO) | Admitting: Adult Health

## 2020-02-05 ENCOUNTER — Ambulatory Visit
Admission: RE | Admit: 2020-02-05 | Discharge: 2020-02-05 | Disposition: A | Discharge status: Home / Self Care | Payer: Managed Care, Other (non HMO) | Source: Ambulatory Visit | Attending: Orthopedic Surgery | Admitting: Orthopedic Surgery

## 2020-02-05 DIAGNOSIS — M25512 Pain in left shoulder: Secondary | ICD-10-CM

## 2020-02-06 ENCOUNTER — Encounter: Payer: Self-pay | Admitting: Adult Health

## 2020-02-06 DIAGNOSIS — I700 Atherosclerosis of aorta: Secondary | ICD-10-CM | POA: Insufficient documentation

## 2020-02-18 HISTORY — PX: TOTAL SHOULDER ARTHROPLASTY: SHX126

## 2020-02-27 IMAGING — MG DIGITAL SCREENING BILAT W/ TOMO W/ CAD
8 series · 8 of 24 positions shown · non-contrast
Comparison: Previous exam(s).

CLINICAL DATA: Screening.

EXAM:
DIGITAL SCREENING BILATERAL MAMMOGRAM WITH TOMO AND CAD

[R MLO synth-2D]
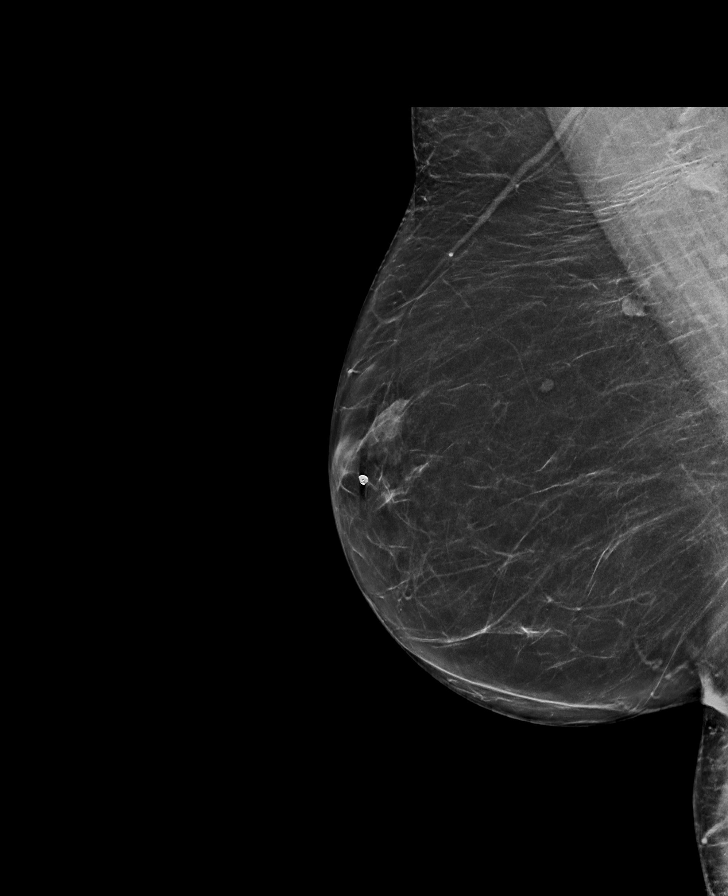

[L MLO synth-2D]
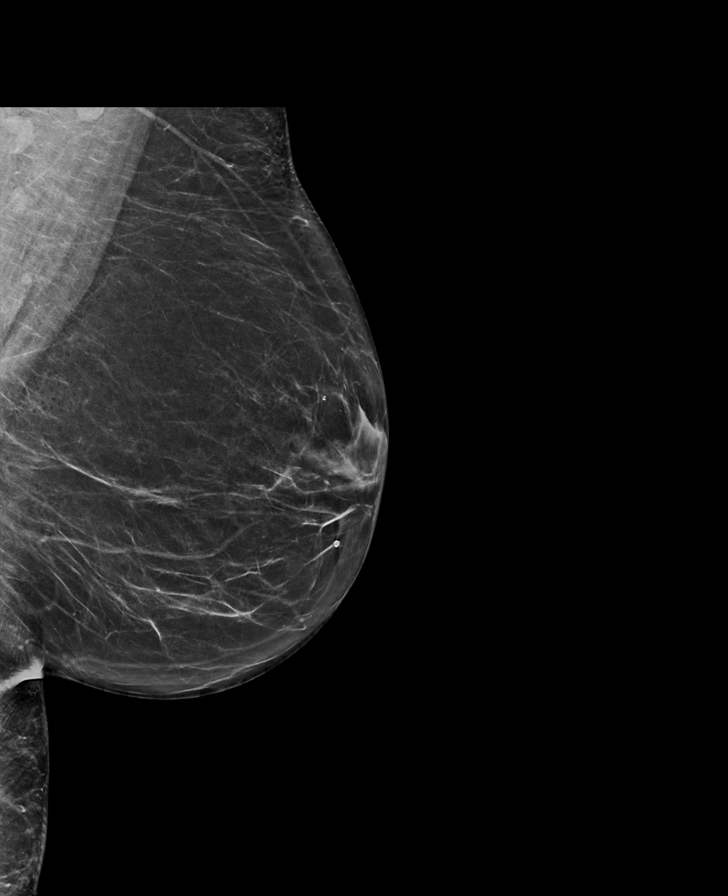

[R CC synth-2D]
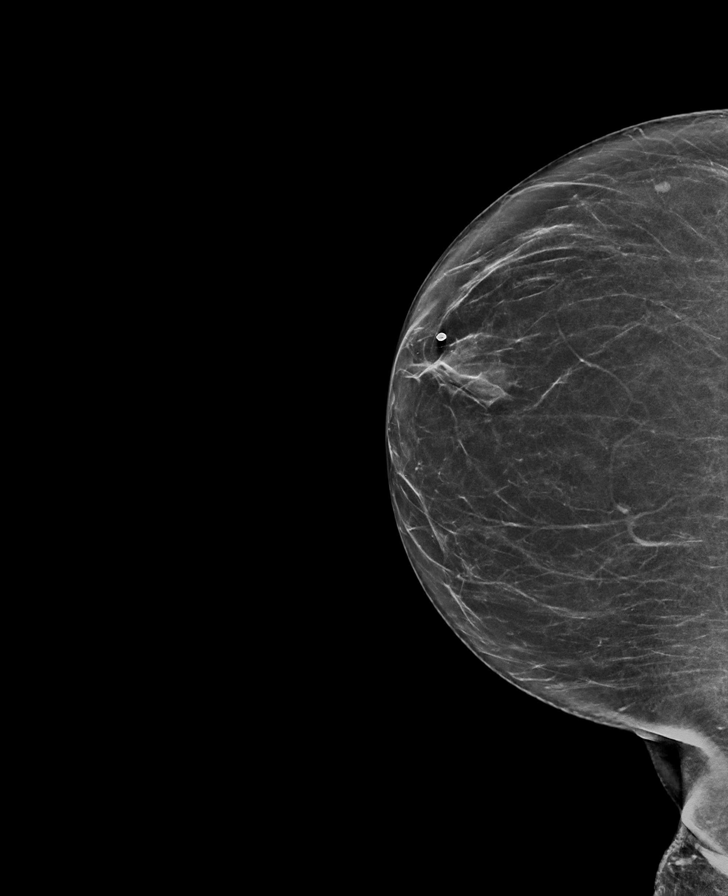

[L CC synth-2D]
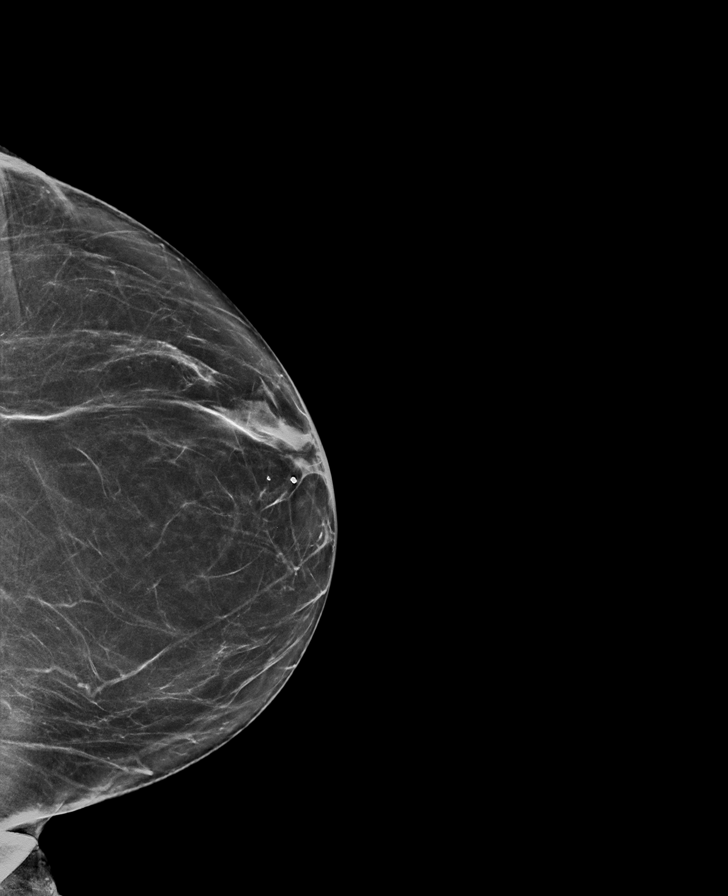

[R MLO tomo · tomo slice 37/74.0]
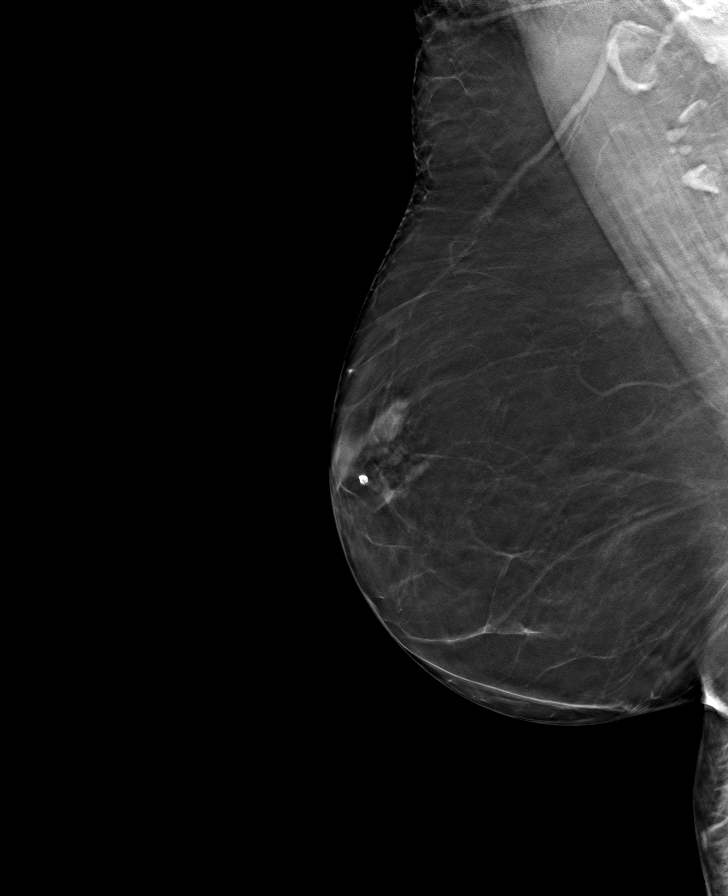

[R CC tomo · tomo slice 33/65.0]
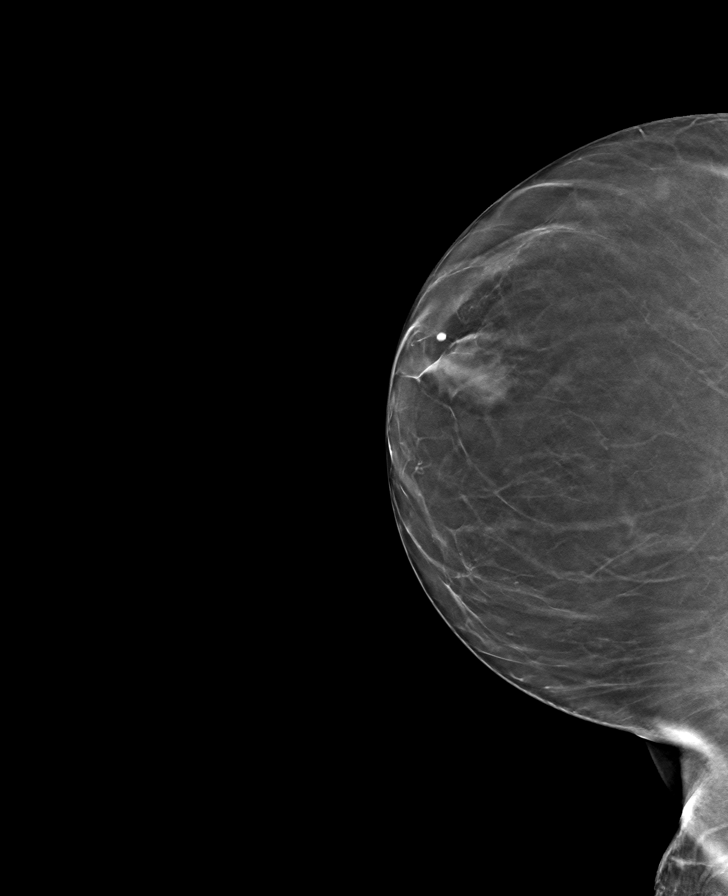

[L MLO tomo · tomo slice 36/71.0]
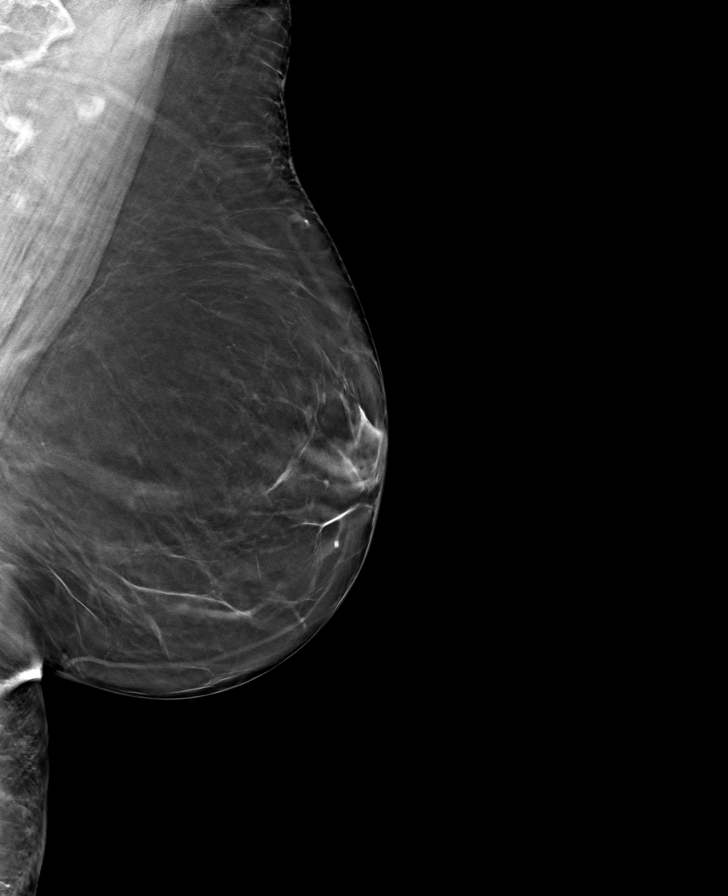

[L CC tomo · tomo slice 35/68.0]
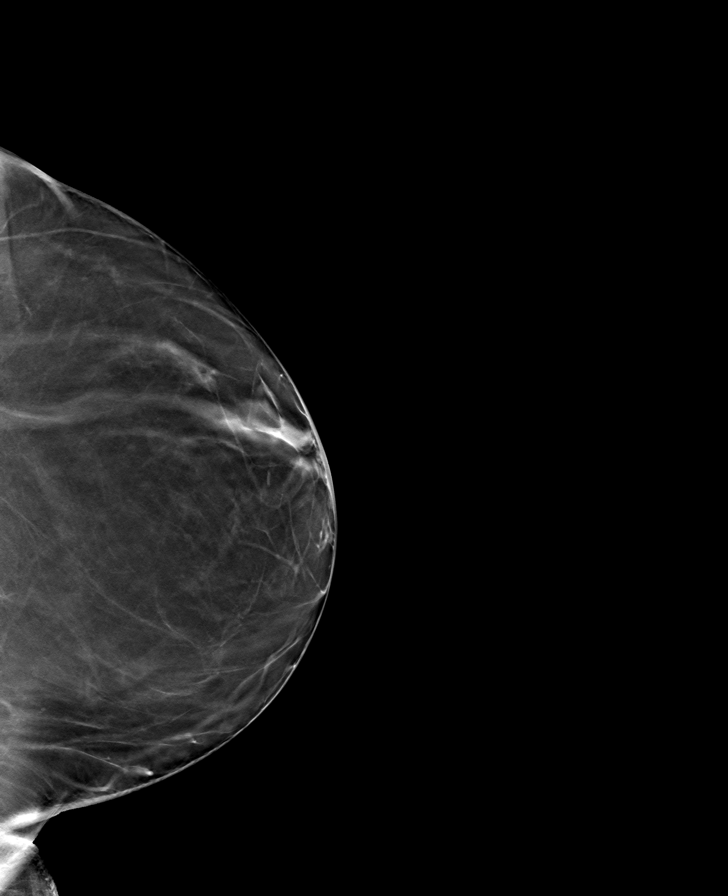

[8 of 24 positions shown; findings below may reference images not displayed]

ACR Breast Density Category b: There are scattered areas of
fibroglandular density.
FINDINGS: There are no findings suspicious for malignancy. Images were
processed with CAD.
IMPRESSION: No mammographic evidence of malignancy. A result letter of this
screening mammogram will be mailed directly to the patient.

RECOMMENDATION:
Screening mammogram in one year. (Code:[id number])

BI-RADS CATEGORY  1: Negative.

## 2020-03-25 NOTE — Progress Notes (Deleted)
FOLLOW UP  Assessment and Plan:   Hypertension Well controlled at this time off of medications Monitor blood pressure at home; patient to call if consistently greater than 130/80 Continue DASH diet.   Reminder to go to the ER if any CP, SOB, nausea, dizziness, severe HA, changes vision/speech, left arm numbness and tingling and jaw pain.  Cholesterol Currently above goal; working on lifestyle Continue low cholesterol diet and exercise.  Check lipid panel.   Other abnormal glucose Recent A1Cs at goal Discussed diet/exercise, weight management  Defer A1C; check CMP  Overweight with co morbidities Long discussion about weight loss, diet, and exercise Recommended diet heavy in fruits and veggies and low in animal meats, cheeses, and dairy products, appropriate calorie intake Discussed ideal weight for height  Patient will work on maintaining weight through holidays Will follow up in 3 months  Vitamin D Def Below goal at last visit; has increased dose continue supplementation to maintain goal of 70-100 Check Vit D level  Depression/anxiety Discussed lexapro; information provided; start 10 mg daily, follow up in 8-12 weeks Lifestyle discussed: diet/exerise, sleep hygiene, stress management, hydration  Psoriatic arthritis Continue follow up with Dr. 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 Further disposition pending results of labs. Discussed med's effects and SE's.   Over 30 minutes of exam, counseling, chart review, and critical decision making was performed.   Future Appointments  Date Time Provider [place]  03/29/2020  9:00 AM Liane Comber, NP [facility] None  06/03/2020 10:00 AM Liane Comber, NP [facility] None     ----------------------------------------------------------------------------------------------------------------------  HPI 64 y.o. female  presents for 6 month follow up on hypertension, cholesterol, glucose management, weight and vitamin D deficiency.   She follows with Dr. Amil Amen for psoriatic arthritis, on MTX and stelara. She also follows with Dr. Sherley Bounds with [facility] for her back, and Dr. Mardelle Matte for her shoulder. 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   She was on wellbutrin 300 mg daily *** for recent episode of major depression; She presented in fall 2020 reporting persistent blurry vision, diplopia, had seen Dr. Katy Fitch who ordered MRI which was unremarkable, concern for MG though labs were negative; she was referred to neurology, and again to neuro/opth specialist at [facility] who felt high dose wellbutrin may have contributed, discontinued this. Patient states she cannot tell a difference since discontinuing this medication. Reports ongoing double vision, stumbling. ***  She reports mom's dementia is advancing, work has been very stressful. Lexapro 10 mg daily was initiated ***  BMI is There is no height or weight on file to calculate BMI., she is working on diet and exercise. Wt Readings from Last 3 Encounters:  12/19/19 158 lb (71.7 kg)  06/26/19 156 lb (70.8 kg)  06/11/19 156 lb (70.8 kg)   Incidentally noted on MRI by ortho Dr. Mardelle Matte, 2021, reported by patient. Pending receipt of MRI report, requested. *** Her blood pressure has been controlled at home, today their BP is    She does not workout. She denies chest pain, shortness of breath, dizziness.   She is on cholesterol medication. Her cholesterol is not at goal, newly on pravastatin *** The cholesterol last visit was:   Lab Results  Component Value Date   CHOL 194 12/19/2019   HDL  51 12/19/2019   LDLCALC 121 (H) 12/19/2019    TRIG 108 12/19/2019   CHOLHDL 3.8 12/19/2019    She has not been working on diet and exercise for glucose management, and denies increased appetite, nausea, paresthesia of the feet, polydipsia, polyuria and visual disturbances. Last A1C in the office was:  Lab Results  Component Value Date   HGBA1C 5.6 06/04/2019   Patient is on Vitamin D supplement, has increased to taking 50000 three days a week, but reports last month due to many covid 19 outbreaks in office she was doubling this dose for about 1 month.  Lab Results  Component Value Date   VD25OH 77 12/19/2019        Current Medications:  Current Outpatient Medications on File Prior to Visit  Medication Sig  . acetaminophen (TYLENOL) 500 MG tablet Take by mouth every 6 (six) hours as needed.  Marland Kitchen alendronate (FOSAMAX) 70 MG tablet Take 1 tablet (70 mg total) by mouth every 7 (seven) days. Take with a full glass of water on an empty stomach.  . escitalopram (LEXAPRO) 10 MG tablet Take 1 tablet (10 mg total) by mouth daily.  . folic acid (FOLVITE) 1 MG tablet Take 1 mg by mouth daily.  . methotrexate (RHEUMATREX) 10 MG tablet Take 10 mg by mouth. Caution: Chemotherapy. Protect from light. Takes 12 tablets a week.  . pravastatin (PRAVACHOL) 20 MG tablet Take 1 tablet (20 mg total) by mouth daily with supper.  . ustekinumab (STELARA) 45 MG/0.5ML SOSY injection Inject 45 mg into the skin. Injects once every quarter  . Vitamin D, Ergocalciferol, (DRISDOL) 1.25 MG (50000 UT) CAPS capsule TAKE ONE CAPSULE BY MOUTH THREE TIMES WEEKLY FOR DEFICIENCY   No current facility-administered medications on file prior to visit.     Allergies: No Known Allergies   Medical History:  Past Medical History:  Diagnosis Date  . Allergy   . Left thyroid nodule 03/31/2013   Benign per thyroid biopsy in 2014  . Mass of chest wall 12/2012   tender to the touch  . Osteoarthritis   . Seasonal allergies    Family history- Reviewed and unchanged Social  history- Reviewed and unchanged   Review of Systems:  Review of Systems  Constitutional: Negative for malaise/fatigue and weight loss.  HENT: Negative for hearing loss and tinnitus.   Eyes: Positive for blurred vision, double vision and redness.       Dry eyes  Respiratory: Negative for cough, shortness of breath and wheezing.   Cardiovascular: Negative for chest pain, palpitations, orthopnea, claudication and leg swelling.  Gastrointestinal: Negative for abdominal pain, blood in stool, constipation, diarrhea, heartburn, melena, nausea and vomiting.  Genitourinary: Negative.   Musculoskeletal: Negative for joint pain and myalgias.  Skin: Negative for rash.  Neurological: Positive for focal weakness (eyes). Negative for dizziness, tingling, sensory change, weakness and headaches.  Endo/Heme/Allergies: Negative for polydipsia.  Psychiatric/Behavioral: Positive for depression. Negative for hallucinations, substance abuse and suicidal ideas. The patient is not nervous/anxious and does not have insomnia.   All other systems reviewed and are negative.    Physical Exam: There were no vitals taken for this visit. Wt Readings from Last 3 Encounters:  12/19/19 158 lb (71.7 kg)  06/26/19 156 lb (70.8 kg)  06/11/19 156 lb (70.8 kg)   General Appearance: Well nourished, in no apparent distress. Eyes: PERRLA, EOMs abn (see neuro), conjunctiva appear mildly erythematous/irritated without discharge Sinuses: No Frontal/maxillary tenderness ENT/Mouth: Ext aud canals clear, TMs without erythema,  bulging. No erythema, swelling, or exudate on post pharynx.  Tonsils not swollen or erythematous. Hearing normal.  Neck: Supple, thyroid normal.  Respiratory: Respiratory effort normal, BS equal bilaterally without rales, rhonchi, wheezing or stridor.  Cardio: RRR with no MRGs. Brisk peripheral pulses without edema.  Abdomen: Soft, + BS.  Non tender, no guarding, rebound, hernias, masses. Lymphatics: Non  tender without lymphadenopathy.  Musculoskeletal: Full ROM, 5/5 strength, Normal gait Skin: Warm, dry without rashes, lesions, ecchymosis.  Neuro: CN intact excepting R interior oblique overreaction with strabismus; reflexes equal bilaterally. Normal muscle tone, no cerebellar symptoms. Sensation intact.  Psych: Awake and oriented X 3, tearful affect, Insight and Judgment appropriate.    Izora Ribas, NP 12:49 PM [place] [facility]

## 2020-03-26 ENCOUNTER — Other Ambulatory Visit: Payer: Self-pay | Admitting: Adult Health

## 2020-03-26 DIAGNOSIS — E559 Vitamin D deficiency, unspecified: Secondary | ICD-10-CM

## 2020-03-29 ENCOUNTER — Ambulatory Visit: Payer: Managed Care, Other (non HMO) | Admitting: Adult Health

## 2020-04-12 NOTE — Progress Notes (Signed)
FOLLOW UP  Assessment and Plan:   Hypertension Well controlled at this time off of medications Monitor blood pressure at home; patient to call if consistently greater than 130/80 Continue DASH diet.   Reminder to go to the ER if any CP, SOB, nausea, dizziness, severe HA, changes vision/speech, left arm numbness and tingling and jaw pain.  Cholesterol Currently above goal; working on lifestyle, nowly on rosuvastatin 20 mg daily  Continue low cholesterol diet and exercise.  Check lipid panel.   Other abnormal glucose Recent A1Cs at goal Discussed diet/exercise, weight management  Defer A1C; check CMP  Overweight with co morbidities Long discussion about weight loss, diet, and exercise Recommended diet heavy in fruits and veggies and low in animal meats, cheeses, and dairy products, appropriate calorie intake Discussed ideal weight for height  Patient will work on maintaining weight through holidays Will follow up in 3 months  Vitamin D Def Below goal at last visit; has increased dose continue supplementation to maintain goal of 70-100 Check Vit D level  Depression/anxiety Lexapro 10 mg with some benefit, patient pref to restart wellbutrin if cleared by ophthalmology at upcoming follow up, plan to restart at 150 mg dose Lifestyle discussed: diet/exerise, sleep hygiene, stress management, hydration  Psoriatic arthritis Continue follow up with Dr. Amil Amen Continue current medications Monitor closely  Estropia/diplopia No improvement off of wellbutrin per patient; following with [facility], follow up planned in 2 weeks  If cleared by vision will restart wellbutrin per her strong preference  Incontinence;  S/p hysterectomy, known bladder prolapse per patient Discussed treatment options; wants to try kegals for a few months, then would like to pursue pessary Will follow up 3 months, refer GYN if no improvement   Continue diet and meds as discussed. Further disposition pending  results of labs. Discussed med's effects and SE's.   Over 30 minutes of exam, counseling, chart review, and critical decision making was performed.   Future Appointments  Date Time Provider [place]  06/03/2020 10:00 AM Liane Comber, NP [facility] None    ----------------------------------------------------------------------------------------------------------------------  HPI 64 y.o. female  presents for 6 month follow up on hypertension, cholesterol, glucose management, weight and vitamin D deficiency.   She follows with Dr. Amil Amen for psoriatic arthritis, on MTX and stelara. She also follows with Dr. Sherley Bounds with [facility] for her neck/back, and Dr. Mardelle Matte for her shoulder, just had L replaced Feb 18, 2020 and has done very well, doing PT.   She was on wellbutrin 300 mg daily for recent episode of major depression; She presented in fall 2020 reporting persistent blurry vision, diplopia, had seen Dr. Katy Fitch who ordered MRI which was unremarkable, concern for MG though labs were negative; she was referred to neurology, and again to neuro/opth specialist at [facility] who felt high dose wellbutrin may have contributed, discontinued this. Reports ongoing double vision, stumbling, has dry eye and watery discharge, discussing possible surgical correction for eye lid weakness. Has follow up with vision provider next week.   She reports mom's dementia is advancing, work has been very stressful. Lexapro 10 mg daily was initiated with some benefit. She would like to restart wellbutrin if possible due to energy and weight benefits.   BMI is Body mass index is 27.64 kg/m., she is working on diet and exercise. Wt Readings from Last 3 Encounters:  04/13/20 161 lb (73 kg)  12/19/19 158 lb (71.7 kg)  06/26/19 156 lb (70.8 kg)   Aortic atherosclerosis incidentally noted on MRI by ortho  Dr. Mardelle Matte, 2021, reported by patient. Pending receipt of MRI report, requested.  Her blood  pressure has been controlled at home, today their BP is BP: 106/64  She does not workout. She denies chest pain, shortness of breath, dizziness.   She is on cholesterol medication. Her cholesterol is not at goal, newly on pravastatin 20 mg daily The cholesterol last visit was:   Lab Results  Component Value Date   CHOL 194 12/19/2019   HDL 51 12/19/2019   LDLCALC 121 (H) 12/19/2019   TRIG 108 12/19/2019   CHOLHDL 3.8 12/19/2019    She has not been working on diet and exercise for glucose management, and denies increased appetite, nausea, paresthesia of the feet, polydipsia, polyuria and visual disturbances. Last A1C in the office was:  Lab Results  Component Value Date   HGBA1C 5.6 06/04/2019   Patient is on Vitamin D supplement, at goal at last check  Lab Results  Component Value Date   VD25OH 77 12/19/2019        Current Medications:  Current Outpatient Medications on File Prior to Visit  Medication Sig  . acetaminophen (TYLENOL) 500 MG tablet Take by mouth every 6 (six) hours as needed.  Marland Kitchen alendronate (FOSAMAX) 70 MG tablet Take 1 tablet (70 mg total) by mouth every 7 (seven) days. Take with a full glass of water on an empty stomach.  . escitalopram (LEXAPRO) 10 MG tablet Take 1 tablet (10 mg total) by mouth daily.  . folic acid (FOLVITE) 1 MG tablet Take 1 mg by mouth daily.  . methotrexate (RHEUMATREX) 10 MG tablet Take 10 mg by mouth. Caution: Chemotherapy. Protect from light. Takes 12 tablets a week.  . pravastatin (PRAVACHOL) 20 MG tablet Take 1 tablet (20 mg total) by mouth daily with supper.  . ustekinumab (STELARA) 45 MG/0.5ML SOSY injection Inject 45 mg into the skin. Injects once every quarter  . Vitamin D, Ergocalciferol, (DRISDOL) 1.25 MG (50000 UNIT) CAPS capsule Take 1 capsule 3 x /week for Vitamin D Deficiency   No current facility-administered medications on file prior to visit.     Allergies: No Known Allergies   Medical History:  Past Medical History:   Diagnosis Date  . Allergy   . Left thyroid nodule 03/31/2013   Benign per thyroid biopsy in 2014  . Mass of chest wall 12/2012   tender to the touch  . Osteoarthritis   . Seasonal allergies    Family history- Reviewed and unchanged Social history- Reviewed and unchanged   Review of Systems:  Review of Systems  Constitutional: Negative for malaise/fatigue and weight loss.  HENT: Negative for hearing loss and tinnitus.   Eyes: Positive for blurred vision, double vision and redness.       Dry eyes  Respiratory: Negative for cough, shortness of breath and wheezing.   Cardiovascular: Negative for chest pain, palpitations, orthopnea, claudication and leg swelling.  Gastrointestinal: Negative for abdominal pain, blood in stool, constipation, diarrhea, heartburn, melena, nausea and vomiting.  Genitourinary: Positive for frequency. Negative for dysuria, flank pain, hematuria and urgency.       Urinary leakage, stress incontinence, known bladder prolapse per patient   Musculoskeletal: Negative for joint pain and myalgias.  Skin: Negative for rash.  Neurological: Positive for focal weakness (eyes). Negative for dizziness, tingling, sensory change, weakness and headaches.  Endo/Heme/Allergies: Negative for polydipsia.  Psychiatric/Behavioral: Positive for depression. Negative for hallucinations, substance abuse and suicidal ideas. The patient is not nervous/anxious and does not have insomnia.  All other systems reviewed and are negative.   Physical Exam: BP 106/64   Pulse 100   Temp (!) 96.6 F (35.9 C)   Ht 5\' 4"  (1.626 m)   Wt 161 lb (73 kg)   SpO2 97%   BMI 27.64 kg/m  Wt Readings from Last 3 Encounters:  04/13/20 161 lb (73 kg)  12/19/19 158 lb (71.7 kg)  06/26/19 156 lb (70.8 kg)   General Appearance: Well nourished, in no apparent distress. Eyes: PERRLA, EOMs abn (see neuro), conjunctiva appear mildly erythematous/irritated without discharge Sinuses: No Frontal/maxillary  tenderness ENT/Mouth: Ext aud canals clear, TMs without erythema, bulging. No erythema, swelling, or exudate on post pharynx.  Tonsils not swollen or erythematous. Hearing normal.  Neck: Supple, thyroid normal.  Respiratory: Respiratory effort normal, BS equal bilaterally without rales, rhonchi, wheezing or stridor.  Cardio: RRR with no MRGs. Brisk peripheral pulses without edema.  Abdomen: Soft, + BS.  Non tender, no guarding, rebound, hernias, masses. Lymphatics: Non tender without lymphadenopathy.  Musculoskeletal: Full ROM, 5/5 strength, Normal gait Skin: Warm, dry without rashes, lesions, ecchymosis.  Neuro: CN intact excepting bil interior oblique overreaction with strabismus R>L; reflexes equal bilaterally. Normal muscle tone, no cerebellar symptoms. Sensation intact.  Psych: Awake and oriented X 3, tearful affect, Insight and Judgment appropriate.    Izora Ribas, NP 9:18 AM [facility]

## 2020-04-13 ENCOUNTER — Other Ambulatory Visit: Payer: Self-pay

## 2020-04-13 ENCOUNTER — Ambulatory Visit: Payer: Managed Care, Other (non HMO) | Admitting: Adult Health

## 2020-04-13 ENCOUNTER — Encounter: Payer: Self-pay | Admitting: Adult Health

## 2020-04-13 VITALS — BP 106/64 | HR 100 | Temp 96.6°F | Ht 64.0 in | Wt 161.0 lb

## 2020-04-13 DIAGNOSIS — Z79899 Other long term (current) drug therapy: Secondary | ICD-10-CM | POA: Diagnosis not present

## 2020-04-13 DIAGNOSIS — R7309 Other abnormal glucose: Secondary | ICD-10-CM

## 2020-04-13 DIAGNOSIS — E663 Overweight: Secondary | ICD-10-CM

## 2020-04-13 DIAGNOSIS — L4050 Arthropathic psoriasis, unspecified: Secondary | ICD-10-CM | POA: Diagnosis not present

## 2020-04-13 DIAGNOSIS — I10 Essential (primary) hypertension: Secondary | ICD-10-CM

## 2020-04-13 DIAGNOSIS — I700 Atherosclerosis of aorta: Secondary | ICD-10-CM | POA: Diagnosis not present

## 2020-04-13 DIAGNOSIS — E559 Vitamin D deficiency, unspecified: Secondary | ICD-10-CM | POA: Diagnosis not present

## 2020-04-13 DIAGNOSIS — E785 Hyperlipidemia, unspecified: Secondary | ICD-10-CM

## 2020-04-13 DIAGNOSIS — H5043 Accommodative component in esotropia: Secondary | ICD-10-CM

## 2020-04-13 DIAGNOSIS — F3341 Major depressive disorder, recurrent, in partial remission: Secondary | ICD-10-CM

## 2020-04-13 NOTE — Patient Instructions (Addendum)
Please mail [place] the MRI that showed aortic atherosclerosis so we can scan into the system.   Try pulsed kegals and contract and hold kegals several times a day     Pelvic Organ Prolapse Pelvic organ prolapse is the stretching, bulging, or dropping of pelvic organs into an abnormal position. It happens when the muscles and tissues that surround and support pelvic structures become weak or stretched. Pelvic organ prolapse can involve the:  Vagina (vaginal prolapse).  Uterus (uterine prolapse).  Bladder (cystocele).  Rectum (rectocele).  Intestines (enterocele). When organs other than the vagina are involved, they often bulge into the vagina or protrude from the vagina, depending on how severe the prolapse is. What are the causes? This condition may be caused by:  Pregnancy, labor, and childbirth.  Past pelvic surgery.  Decreased production of the hormone estrogen associated with menopause.  Consistently lifting more than 50 lb (23 kg).  Obesity.  Long-term inability to pass stool (chronic constipation).  A cough that lasts a long time (chronic).  Buildup of fluid in the abdomen due to certain diseases and other conditions. What are the signs or symptoms? Symptoms of this condition include:  Passing a little urine (loss of bladder control) when you cough, sneeze, strain, and exercise (stress incontinence). This may be worse immediately after childbirth. It may gradually improve over time.  Feeling pressure in your pelvis or vagina. This pressure may increase when you cough or when you are passing stool.  A bulge that protrudes from the opening of your vagina.  Difficulty passing urine or stool.  Pain in your lower back.  Pain, discomfort, or disinterest in sex.  Repeated bladder infections (urinary tract infections).  Difficulty inserting a tampon. In some people, this condition causes no symptoms. How is this diagnosed? This condition may be diagnosed based  on a vaginal and rectal exam. During the exam, you may be asked to cough and strain while you are lying down, sitting, and standing up. Your health care provider will determine if other tests are required, such as bladder function tests. How is this treated? Treatment for this condition may depend on your symptoms. Treatment may include:  Lifestyle changes, such as changes to your diet.  Emptying your bladder at scheduled times (bladder training therapy). This can help reduce or avoid urinary incontinence.  Estrogen. Estrogen may help mild prolapse by increasing the strength and tone of pelvic floor muscles.  Kegel exercises. These may help mild cases of prolapse by strengthening and tightening the muscles of the pelvic floor.  A soft, flexible device that helps support the vaginal walls and keep pelvic organs in place (pessary). This is inserted into your vagina by your health care provider.  Surgery. This is often the only form of treatment for severe prolapse. Follow these instructions at home:  Avoid drinking beverages that contain caffeine or alcohol.  Increase your intake of high-fiber foods. This can help decrease constipation and straining during bowel movements.  Lose weight if recommended by your health care provider.  Wear a sanitary pad or adult diapers if you have urinary incontinence.  Avoid heavy lifting and straining with exercise and work. Do not hold your breath when you perform mild to moderate lifting and exercise activities. Limit your activities as directed by your health care provider.  Do Kegel exercises as directed by your health care provider. To do this: ? Squeeze your pelvic floor muscles tight. You should feel a tight lift in your rectal area and  a tightness in your vaginal area. Keep your stomach, buttocks, and legs relaxed. ? Hold the muscles tight for up to 10 seconds. ? Relax your muscles. ? Repeat this exercise 50 times a day, or as many times as told  by your health care provider. Continue to do this exercise for at least 4-6 weeks, or for as long as told by your health care provider.  Take over-the-counter and prescription medicines only as told by your health care provider.  If you have a pessary, take care of it as told by your health care provider.  Keep all follow-up visits as told by your health care provider. This is important. Contact a health care provider if you:  Have symptoms that interfere with your daily activities or sex life.  Need medicine to help with the discomfort.  Notice bleeding from your vagina that is not related to your period.  Have a fever.  Have pain or bleeding when you urinate.  Have bleeding when you pass stool.  Pass urine when you have sex.  Have chronic constipation.  Have a pessary that falls out.  Have bad smelling vaginal discharge.  Have an unusual, low pain in your abdomen. Summary  Pelvic organ prolapse is the stretching, bulging, or dropping of pelvic organs into an abnormal position. It happens when the muscles and tissues that surround and support pelvic structures become weak or stretched.  When organs other than the vagina are involved, they often bulge into the vagina or protrude from the vagina, depending on how severe the prolapse is.  In most cases, this condition needs to be treated only if it produces symptoms. Treatment may include lifestyle changes, estrogen, Kegel exercises, pessary insertion, or surgery.  Avoid heavy lifting and straining with exercise and work. Do not hold your breath when you perform mild to moderate lifting and exercise activities. Limit your activities as directed by your health care provider. This information is not intended to replace advice given to you by your health care provider. Make sure you discuss any questions you have with your health care provider. Document Revised: 10/10/2017 Document Reviewed: 10/10/2017 Elsevier Patient Education   2020 [employer].

## 2020-04-14 ENCOUNTER — Other Ambulatory Visit: Payer: Self-pay | Admitting: Adult Health

## 2020-04-14 LAB — COMPLETE METABOLIC PANEL WITH GFR
AG Ratio: 2 (calc) (ref 1.0–2.5)
ALT: 18 U/L (ref 6–29)
AST: 16 U/L (ref 10–35)
Albumin: 4.4 g/dL (ref 3.6–5.1)
Alkaline phosphatase (APISO): 80 U/L (ref 37–153)
BUN: 14 mg/dL (ref 7–25)
CO2: 25 mmol/L (ref 20–32)
Calcium: 9.6 mg/dL (ref 8.6–10.4)
Chloride: 103 mmol/L (ref 98–110)
Creat: 0.72 mg/dL (ref 0.50–0.99)
GFR, Est African American: 101 mL/min/{1.73_m2} (ref >=60)
GFR, Est Non African American: 87 mL/min/{1.73_m2} (ref >=60)
Globulin: 2.2 g/dL (calc) (ref 1.9–3.7)
Glucose, Bld: 105 mg/dL — ABNORMAL HIGH (ref 65–99)
Potassium: 4.6 mmol/L (ref 3.5–5.3)
Sodium: 138 mmol/L (ref 135–146)
Total Bilirubin: 0.4 mg/dL (ref 0.2–1.2)
Total Protein: 6.6 g/dL (ref 6.1–8.1)

## 2020-04-14 LAB — CBC WITH DIFFERENTIAL/PLATELET
Absolute Monocytes: 409 cells/uL (ref 200–950)
Basophils Absolute: 39 cells/uL (ref 0–200)
Basophils Relative: 0.7 %
Eosinophils Absolute: 202 cells/uL (ref 15–500)
Eosinophils Relative: 3.6 %
HCT: 39.3 % (ref 35.0–45.0)
Hemoglobin: 13.2 g/dL (ref 11.7–15.5)
Lymphs Abs: 1887 cells/uL (ref 850–3900)
MCH: 31.1 pg (ref 27.0–33.0)
MCHC: 33.6 g/dL (ref 32.0–36.0)
MCV: 92.5 fL (ref 80.0–100.0)
MPV: 11 fL (ref 7.5–12.5)
Monocytes Relative: 7.3 %
Neutro Abs: 3063 cells/uL (ref 1500–7800)
Neutrophils Relative %: 54.7 %
Platelets: 291 10*3/uL (ref 140–400)
RBC: 4.25 10*6/uL (ref 3.80–5.10)
RDW: 12.4 % (ref 11.0–15.0)
Total Lymphocyte: 33.7 %
WBC: 5.6 10*3/uL (ref 3.8–10.8)

## 2020-04-14 LAB — TSH: TSH: 1.52 mIU/L (ref 0.40–4.50)

## 2020-04-14 LAB — MAGNESIUM: Magnesium: 2.1 mg/dL (ref 1.5–2.5)

## 2020-04-14 LAB — LIPID PANEL
Cholesterol: 206 mg/dL — ABNORMAL HIGH (ref <200)
HDL: 53 mg/dL (ref >=50)
LDL Cholesterol (Calc): 124 mg/dL (calc) — ABNORMAL HIGH
Non-HDL Cholesterol (Calc): 153 mg/dL (calc) — ABNORMAL HIGH (ref <130)
Total CHOL/HDL Ratio: 3.9 (calc) (ref <5.0)
Triglycerides: 172 mg/dL — ABNORMAL HIGH (ref <150)

## 2020-04-14 MED ORDER — ROSUVASTATIN CALCIUM 5 MG PO TABS: ORAL_TABLET | ORAL | 1 refills | Status: DC

## 2020-05-11 ENCOUNTER — Other Ambulatory Visit: Payer: Self-pay | Admitting: Physician Assistant

## 2020-05-18 ENCOUNTER — Encounter: Payer: Managed Care, Other (non HMO) | Admitting: Adult Health

## 2020-06-03 ENCOUNTER — Encounter: Payer: Managed Care, Other (non HMO) | Admitting: Adult Health

## 2020-06-09 ENCOUNTER — Other Ambulatory Visit: Payer: Self-pay | Admitting: Adult Health

## 2020-06-11 ENCOUNTER — Other Ambulatory Visit: Payer: Self-pay | Admitting: Adult Health

## 2020-06-11 MED ORDER — ROSUVASTATIN CALCIUM 5 MG PO TABS: ORAL_TABLET | ORAL | 1 refills | Status: DC

## 2020-06-23 ENCOUNTER — Other Ambulatory Visit: Payer: Self-pay | Admitting: Internal Medicine

## 2020-06-23 DIAGNOSIS — F32A Depression, unspecified: Secondary | ICD-10-CM

## 2020-07-19 ENCOUNTER — Encounter: Payer: Managed Care, Other (non HMO) | Admitting: Adult Health Nurse Practitioner

## 2020-07-27 ENCOUNTER — Encounter: Payer: Managed Care, Other (non HMO) | Admitting: Adult Health Nurse Practitioner

## 2020-08-10 ENCOUNTER — Encounter: Payer: Managed Care, Other (non HMO) | Admitting: Adult Health Nurse Practitioner

## 2020-08-12 DIAGNOSIS — E538 Deficiency of other specified B group vitamins: Secondary | ICD-10-CM | POA: Insufficient documentation

## 2020-08-12 NOTE — Progress Notes (Signed)
Complete Physical  Assessment and Plan:  Diagnoses and all orders for this visit:  Encounter for general adult medical examination with abnormal findings  Hemorrhoids, unspecified hemorrhoid type Continue anusol PRN Follow up with GI for worsening symptoms  Essential hypertension Currently at goal by lifestyle modification Monitor blood pressure at home; call if consistently over 130/80 Continue DASH diet.   Reminder to go to the ER if any CP, SOB, nausea, dizziness, severe HA, changes vision/speech, left arm numbness and tingling and jaw pain.  Psoriatic arthritis ([place]) Continue DMARD Follows with Dr. Amil Amen  Vitamin D deficiency Continue supplementation Check vitamin D level  Medication management CBC, CMP/GFR  Hyperlipidemia, unspecified hyperlipidemia type Continue medicaitons for LDL goal <100 Continue low cholesterol diet and exercise.  Check lipid panel.   Allergic state, sequela Continue OTC allergy pills  Screening for hematuria or proteinuria UA, microalbumin  B12 deficiency - get on SL supplement  - Vitamin B12  Overweight (BMI 25.0-29.9) Long discussion about weight loss, diet, and exercise Recommended diet heavy in fruits and veggies and low in animal meats, cheeses, and dairy products, appropriate calorie intake Patient will work on avoiding junk food Will restart phentermine for 3 month course and follow up  Discussed appropriate weight for height  Follow up at next visit  Other abnormal glucose Discussed disease and risks Discussed diet/exercise, weight management  A1C  Recurrent major depressive disorder, in partial remission ([place]) Per strong patient preference will try restarting lower dose wellbutrin; monitor for worsening eye sx and stop if this happens Lifestyle discussed: diet/exerise, sleep hygiene, stress management, hydration  Diplopia/esotrophia with inferior oblique overaction Significant EOM abnormalities, MRI by opth was  unremarkable; no imporvement off of wellbutrin. Following with [facility] neuro/ophth - will be seeing new provider at [place] gain/ hair loss Per patient request checking T3 and T4 as well, she understands may not be covered  Strong family hx of thyroid dz;  Also check B12 levels, consider B complex, hair skin nails supplement   GERD Initiated medication for new reflux related symptoms: famotidine 20 mg BID Discussed diet, avoiding triggers and other lifestyle changes   Patient just had normal CMP and CBC by Dr. Amil Amen last week, requests skip this today    Orders Placed This Encounter  Procedures  . Flu vaccine HIGH DOSE PF  . Pneumococcal polysaccharide vaccine 23-valent greater than or equal to 2yo subcutaneous/IM  . Magnesium  . Lipid panel  . Hemoglobin A1c  . VITAMIN D 25 Hydroxy (Vit-D Deficiency, Fractures)  . Vitamin B12  . Microalbumin / creatinine urine ratio  . Urinalysis, Routine w reflex microscopic  . Thyroid Panel With TSH  . Iron, TIBC and Ferritin Panel  . EKG 12-Lead     Discussed med's effects and SE's. Screening labs and tests as requested with regular follow-up as recommended. Over 40 minutes of exam, counseling, chart review, and complex, high level critical decision making was performed this visit.   Future Appointments  Date Time Provider [place]  12/08/2020  8:45 AM Liane Comber, NP [facility] None  03/14/2021  8:45 AM Liane Comber, NP [facility] None  08/17/2021  9:00 AM Liane Comber, NP [facility] None     HPI  64 y.o. female  presents for a complete physical and follow up for has Hemorrhoids; Allergy; Other abnormal glucose; Hyperlipidemia; Psoriatic arthritis ([place]); Essential hypertension; Vitamin D deficiency; Medication management; Recurrent major depressive disorder, in partial remission ([place]); Cervical arthritis; Overweight (BMI 25.0-29.9); Vertical diplopia; Binocular vision  disorder with diplopia; Esotropia with  inferior oblique overaction; Accommodative convergent strabismus; Osteoporosis; Atherosclerosis of aortic arch ([facility]); and B12 deficiency on their problem list.   She is divorced, 2 kids, 1 passed due to pneumonia. Son is well, 1 grandchild, 82 y/o, has cochlear implants and doing well.  She has steady boyfriend of many years whom she lives with.   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   She also follows with Dr. Sherley Bounds with [facility] for her back, and Dr. Mardelle Matte for her shoulder and hips, discussing possible bil hip replacements.   She follows with Dr. Amil Amen for psoriatic arthritis, on MTX and stelara, deferring retirement due to drug costs. She also follows with Dr. Sherley Bounds with [facility] for her neck/back, and Dr. Mardelle Matte for her shoulder, just had L replaced Feb 18, 2020 and has done very well.   She was on wellbutrin 300 mg daily for major depression; She presented in fall 2020 reporting persistent blurry vision, diplopia, had seen Dr. Katy Fitch who ordered MRI which was unremarkable, concern for MG though labs were negative; she was referred to neurology, and again to neuro/opth specialist at [facility] who felt high dose wellbutrin may have contributed, discontinued this but without significant improvement. Has ongoing double vision, has dry eye and watery discharge, improving with steroids, discussing possible surgical correction for eye lid weakness, will be seeing a new provider in [place] later this year - Dr. Langley Adie with [facility].   She reports mom's dementia is advancing, work has been very stressful but managing well. Was on lexapro for mood with some benefit but tapered off. She would like to restart wellbutrin if possible due to energy and weight benefits.    BMI is Body mass index is 29.59 kg/m., she is working on diet and exercise, was down to 148 lb prior to covid 19 but has gained back due to admitted excessive snacking  while at work. She does walk on work breaks, getting in average of 3 miles of walking daily.  Wt Readings from Last 3 Encounters:  08/16/20 164 lb 6.4 oz (74.6 kg)  04/13/20 161 lb (73 kg)  12/19/19 158 lb (71.7 kg)   Her blood pressure has been controlled at home, today their BP is BP: 120/72 She does not workout. She denies chest pain, shortness of breath, dizziness.   She is on cholesterol medication rosuvastatin 5 mg daily and denies myalgias. Her cholesterol is not at goal. The cholesterol last visit was:   Lab Results  Component Value Date   CHOL 206 (H) 04/13/2020   HDL 53 04/13/2020   LDLCALC 124 (H) 04/13/2020   TRIG 172 (H) 04/13/2020   CHOLHDL 3.9 04/13/2020    Last A1C in the office was:  Lab Results  Component Value Date   HGBA1C 5.6 06/04/2019    She has hx of left thyroid nodule; underwent US and biopsy in 2014 which showed benign results. TSHs have been stable.  This year has had thinning hair, very dry skin, weight gain, fatigue; She discussed with Dr. Amil Amen and requests full thyroid panel, T3, T4 checked today  Lab Results  Component Value Date   TSH 1.52 04/13/2020   Last GFR: Lab Results  Component Value Date   GFRNONAA 87 04/13/2020   Patient is on Vitamin D supplement, taking 50000 3-4 days a week  Lab Results  Component Value Date   VD25OH 77 12/19/2019  She has hx of low B12, not on supplement, does want levels checked today Lab Results  Component Value Date   VITAMINB12 280 06/04/2019     Current Medications:  Current Outpatient Medications on File Prior to Visit  Medication Sig Dispense Refill  . acetaminophen (TYLENOL) 500 MG tablet Take by mouth every 6 (six) hours as needed.    Marland Kitchen alendronate (FOSAMAX) 70 MG tablet Take 1 tablet (70 mg total) by mouth every 7 (seven) days. Take with a full glass of water on an empty stomach. 4 tablet 11  . folic acid (FOLVITE) 1 MG tablet Take 1 mg by mouth daily.    . methotrexate (RHEUMATREX) 10 MG  tablet Take 10 mg by mouth. Caution: Chemotherapy. Protect from light. Takes 12 tablets a week.    Marland Kitchen PROCTOSOL HC 2.5 % rectal cream PLACE 1 APPLICATION RECTALLY 2 (TWO) TIMES DAILY. 28.35 g 1  . rosuvastatin (CRESTOR) 5 MG tablet Take 1 tab daily or as directed for cholesterol. 90 tablet 1  . ustekinumab (STELARA) 45 MG/0.5ML SOSY injection Inject 45 mg into the skin. Injects once every quarter    . Vitamin D, Ergocalciferol, (DRISDOL) 1.25 MG (50000 UNIT) CAPS capsule Take 1 capsule 3 x /week for Vitamin D Deficiency 39 capsule 1  . escitalopram (LEXAPRO) 10 MG tablet Take 1 tablet     Daily      for Mood (Patient not taking: Reported on 08/16/2020) 90 tablet 0   No current facility-administered medications on file prior to visit.   Allergies:  No Known Allergies Medical History:  She has Hemorrhoids; Allergy; Other abnormal glucose; Hyperlipidemia; Psoriatic arthritis ([place]); Essential hypertension; Vitamin D deficiency; Medication management; Recurrent major depressive disorder, in partial remission ([place]); Cervical arthritis; Overweight (BMI 25.0-29.9); Vertical diplopia; Binocular vision disorder with diplopia; Esotropia with inferior oblique overaction; Accommodative convergent strabismus; Osteoporosis; Atherosclerosis of aortic arch ([place]); and B12 deficiency on their problem list. Health Maintenance:   Immunization History  Administered Date(s) Administered  . Influenza, High Dose Seasonal PF 06/04/2019, 08/16/2020  . Moderna SARS-COVID-2 Vaccination 11/24/2019, 12/22/2019  . PPD Test 05/07/2017  . Pneumococcal Conjugate-13 06/04/2019  . Pneumococcal Polysaccharide-23 08/16/2020  . Tdap 12/09/2015   Tetanus: 2017 Pneumovax: DUE TODAY Prevnar 13: 2020 Flu vaccine: 2020, TODAY Shingrix: will check with Dr. Amil Amen Covid 19: 2/2, 2021, moderna  LMP: 20+ years s/p TAH Pap: 2017  neg HPV, never abnormal, DONE MGM: 11/2019 DEXA: 11/2019, L fem T-2.8, fosamax started - taking weekly    Colonoscopy: 2014 due 2024  Thyroid US/biopsy: 2014, benign  Last Dental Exam: q6 months, 2021 Last Eye Exam: [facility] (group with Dr. Charlene Brooke), last visit 2021 Last Derm Exam: Dr. Allyson Sabal, goes annually, last 2020, will schedule  Patient Care Team: Unk Pinto, MD as PCP - General (Internal Medicine) Wonda Horner, MD as Consulting Physician (Gastroenterology) Hennie Duos, MD as Consulting Physician (Rheumatology) Marchia Bond, MD as Consulting Physician (Orthopedic Surgery) Elberta Spaniel, MD as Referring Physician (Neurology)  Surgical History:  She has a past surgical history that includes Cholecystectomy (1997); Shoulder surgery (03/2012); Cervical fusion (2005); Anterior cervical decomp/discectomy fusion (08/08/2012); Total shoulder arthroplasty (09/24/2012); Eye surgery; Lumbar laminectomy/decompression microdiscectomy (01/01/2013); Lumbar fusion (01/01/2013); Abdominal hysterectomy (1997); Mass excision (N/A, 01/29/2013); Hemorrhoid surgery (N/A, 09/02/2013); Reduction mammaplasty (Bilateral); and Total shoulder arthroplasty (Left, 02/18/2020). Family History:  Herfamily history includes Asthma in her daughter; Breast cancer in her cousin, cousin, and maternal aunt; CAD (age of onset: 22) in her  mother; Cancer in her paternal grandfather; Dementia in her mother; Diabetes in her father; Goiter in her maternal grandmother; Heart disease in her father; Lung cancer (age of onset: 5) in her maternal grandfather; Ovarian cancer (age of onset: 30) in her paternal grandmother; Pancreatitis in her father; Thyroid cancer in her mother; Thyroid disease in her maternal grandmother and another family member. Social History:  She reports that she has never smoked. She has never used smokeless tobacco. She reports that she does not drink alcohol and does not use drugs.   Review of Systems: Review of Systems  Constitutional: Positive for malaise/fatigue. Negative for  weight loss.  HENT: Negative for hearing loss and tinnitus.   Eyes: Positive for double vision (intermittent, improving). Negative for blurred vision, photophobia, pain, discharge and redness.  Respiratory: Negative for cough, sputum production, shortness of breath and wheezing.   Cardiovascular: Negative for chest pain, palpitations, orthopnea, claudication, leg swelling and PND.  Gastrointestinal: Positive for heartburn. Negative for abdominal pain, blood in stool, constipation, diarrhea, melena, nausea and vomiting.  Genitourinary: Negative.   Musculoskeletal: Positive for joint pain. Negative for falls and myalgias.  Skin: Negative for rash.  Neurological: Negative for dizziness, tingling, tremors, sensory change, speech change, focal weakness, seizures, loss of consciousness, weakness and headaches.  Endo/Heme/Allergies: Negative for polydipsia.  Psychiatric/Behavioral: Negative.  Negative for depression, memory loss, substance abuse and suicidal ideas. The patient is not nervous/anxious and does not have insomnia.   All other systems reviewed and are negative.   Physical Exam: Estimated body mass index is 29.59 kg/m as calculated from the following:   Height as of this encounter: 5' 2.5" (1.588 m).   Weight as of this encounter: 164 lb 6.4 oz (74.6 kg). BP 120/72   Pulse 66   Temp (!) 97.3 F (36.3 C)   Ht 5' 2.5" (1.588 m)   Wt 164 lb 6.4 oz (74.6 kg)   SpO2 99%   BMI 29.59 kg/m  General Appearance: Well nourished, in no apparent distress.  Eyes: Pupils symmetrical but with significant difficulty focusing with erratic pupil reactions,EOM abnormal, bil interior oblique overreaction with strabismus R>L, conjunctiva no swelling or erythema  Sinuses: No Frontal/maxillary tenderness  ENT/Mouth: Ext aud canals clear, normal light reflex with TMs without erythema, bulging. Good dentition. No erythema, swelling, or exudate on post pharynx. Tonsils not swollen or erythematous. Hearing  normal.  Neck: Supple, thyroid subtly enlarged, L > R, no distinct palpable nodules. No bruits  Respiratory: Respiratory effort normal, BS equal bilaterally without rales, rhonchi, wheezing or stridor.  Cardio: RRR without murmurs, rubs or gallops. Brisk peripheral pulses without edema.  Chest: symmetric, with normal excursions and percussion.  Breasts: Symmetric, without lumps, nipple discharge, retractions. Well healed reduction scars.  Abdomen: Soft, nontender, no guarding, rebound, hernias, masses, or organomegaly.  Lymphatics: Non tender without lymphadenopathy.  Genitourinary: Defer Musculoskeletal: Full ROM all peripheral extremities, 5/5 strength, and normal gait.  Skin: Warm, dry, without lesions, ecchymosis. psoriatic rash scattered - erythematous, scaly/flaky Neuro: Cranial nerves intact excepting III, IV, VI as noted by EOMs,  reflexes equal bilaterally. Normal muscle tone, no cerebellar symptoms. Sensation intact.  Psych: Awake and oriented X 3, pleasant, normal affect, Insight and Judgment appropriate.   EKG: WNL, no ST changes.  Gorden Harms Kelton Bultman 12:52 PM [facility]

## 2020-08-16 ENCOUNTER — Encounter: Payer: Self-pay | Admitting: Adult Health

## 2020-08-16 ENCOUNTER — Ambulatory Visit (INDEPENDENT_AMBULATORY_CARE_PROVIDER_SITE_OTHER): Payer: Managed Care, Other (non HMO) | Admitting: Adult Health

## 2020-08-16 ENCOUNTER — Other Ambulatory Visit: Payer: Self-pay

## 2020-08-16 VITALS — BP 120/72 | HR 66 | Temp 97.3°F | Ht 62.5 in | Wt 164.4 lb

## 2020-08-16 DIAGNOSIS — F3341 Major depressive disorder, recurrent, in partial remission: Secondary | ICD-10-CM

## 2020-08-16 DIAGNOSIS — Z79899 Other long term (current) drug therapy: Secondary | ICD-10-CM | POA: Diagnosis not present

## 2020-08-16 DIAGNOSIS — Z23 Encounter for immunization: Secondary | ICD-10-CM | POA: Diagnosis not present

## 2020-08-16 DIAGNOSIS — L659 Nonscarring hair loss, unspecified: Secondary | ICD-10-CM

## 2020-08-16 DIAGNOSIS — Z136 Encounter for screening for cardiovascular disorders: Secondary | ICD-10-CM | POA: Diagnosis not present

## 2020-08-16 DIAGNOSIS — R5383 Other fatigue: Secondary | ICD-10-CM

## 2020-08-16 DIAGNOSIS — R635 Abnormal weight gain: Secondary | ICD-10-CM

## 2020-08-16 DIAGNOSIS — Z130 Encounter for screening for diseases of the blood and blood-forming organs and certain disorders involving the immune mechanism: Secondary | ICD-10-CM | POA: Diagnosis not present

## 2020-08-16 DIAGNOSIS — I700 Atherosclerosis of aorta: Secondary | ICD-10-CM

## 2020-08-16 DIAGNOSIS — D649 Anemia, unspecified: Secondary | ICD-10-CM

## 2020-08-16 DIAGNOSIS — L4050 Arthropathic psoriasis, unspecified: Secondary | ICD-10-CM

## 2020-08-16 DIAGNOSIS — I10 Essential (primary) hypertension: Secondary | ICD-10-CM | POA: Diagnosis not present

## 2020-08-16 DIAGNOSIS — E538 Deficiency of other specified B group vitamins: Secondary | ICD-10-CM

## 2020-08-16 DIAGNOSIS — E559 Vitamin D deficiency, unspecified: Secondary | ICD-10-CM | POA: Diagnosis not present

## 2020-08-16 DIAGNOSIS — Z13220 Encounter for screening for lipoid disorders: Secondary | ICD-10-CM

## 2020-08-16 DIAGNOSIS — H5000 Unspecified esotropia: Secondary | ICD-10-CM

## 2020-08-16 DIAGNOSIS — Z1329 Encounter for screening for other suspected endocrine disorder: Secondary | ICD-10-CM

## 2020-08-16 DIAGNOSIS — Z1389 Encounter for screening for other disorder: Secondary | ICD-10-CM

## 2020-08-16 DIAGNOSIS — E663 Overweight: Secondary | ICD-10-CM

## 2020-08-16 DIAGNOSIS — Z131 Encounter for screening for diabetes mellitus: Secondary | ICD-10-CM | POA: Diagnosis not present

## 2020-08-16 DIAGNOSIS — M810 Age-related osteoporosis without current pathological fracture: Secondary | ICD-10-CM

## 2020-08-16 DIAGNOSIS — R7309 Other abnormal glucose: Secondary | ICD-10-CM

## 2020-08-16 DIAGNOSIS — Z8349 Family history of other endocrine, nutritional and metabolic diseases: Secondary | ICD-10-CM

## 2020-08-16 DIAGNOSIS — Z0000 Encounter for general adult medical examination without abnormal findings: Secondary | ICD-10-CM | POA: Diagnosis not present

## 2020-08-16 DIAGNOSIS — E785 Hyperlipidemia, unspecified: Secondary | ICD-10-CM

## 2020-08-16 DIAGNOSIS — Z0001 Encounter for general adult medical examination with abnormal findings: Secondary | ICD-10-CM

## 2020-08-16 MED ORDER — FAMOTIDINE 20 MG PO TABS: 20.0000 mg | ORAL_TABLET | Freq: Two times a day (BID) | ORAL | 1 refills | Status: DC

## 2020-08-16 MED ORDER — BUPROPION HCL ER (XL) 150 MG PO TB24: 150.0000 mg | ORAL_TABLET | ORAL | 1 refills | Status: DC

## 2020-08-16 NOTE — Patient Instructions (Signed)
[name] , Thank you for taking time to come for your Annual Wellness Visit. I appreciate your ongoing commitment to your health goals. Please review the following plan we discussed and let me know if I can assist you in the future.   These are the goals we discussed: Goals     Blood Pressure < 130/80     HEMOGLOBIN A1C < 5.6     LDL CALC < 130       This is a list of the screening recommended for you and due dates:  Health Maintenance  Topic Date Due   Flu Shot  05/02/2020   Pneumonia vaccines (2 of 2 - PPSV23) 06/03/2020   Colon Cancer Screening  10/02/2022*   Mammogram  11/03/2021   Tetanus Vaccine  12/08/2025   DEXA scan (bone density measurement)  Completed   COVID-19 Vaccine  Completed    Hepatitis C: One time screening is recommended by Center for Disease Control  (CDC) for  adults born from [birth year] through 1967.   Completed  *Topic was postponed. The date shown is not the original due date.         Goiter  A goiter is an enlarged thyroid gland. The thyroid is located in the lower front of the neck. It makes hormones that affect many body parts and systems, including the system that affects how quickly the body burns fuel for energy (metabolism). Most goiters are painless and are not a cause for concern. Some goiters can affect the way your thyroid makes thyroid hormones. Goiters and conditions that cause goiters can be treated, if necessary. What are the causes? Common causes of this condition include:  Lack (deficiency) of a mineral called iodine. The thyroid gland uses iodine to make thyroid hormones.  Diseases that attack healthy cells in the body (autoimmune diseases) and affect thyroid function, such as Graves' disease or Hashimoto's disease. These diseases may cause the body to produce too much thyroid hormone (hyperthyroidism) or too little of the hormone (hypothyroidism).  Conditions that cause inflammation of the thyroid (thyroiditis).  One or  more small growths on the thyroid (nodular goiter). Other causes include:  Medical problems caused by abnormal genes that are passed from parent to child (genetic defects).  Thyroid injury or infection.  Tumors that may or may not be cancerous.  Pregnancy.  Certain medicines.  Exposure to radiation. In some cases, the cause may not be known. What increases the risk? This condition is more likely to develop in:  People who do not get enough iodine in their diet.  People who have a family history of goiter.  Women.  People who are older than age 5.  People who smoke tobacco.  People who have had exposure to radiation. What are the signs or symptoms? The main symptom of this condition is swelling in the lower, front part of the neck. This swelling can range from a very small bump to a large lump. Other symptoms may include:  A tight feeling in the throat.  A hoarse voice.  Coughing.  Wheezing.  Difficulty swallowing or breathing.  Bulging veins in the neck.  Dizziness. When a goiter is the result of an overactive thyroid (hyperthyroidism), symptoms may also include:  Nervousness or restlessness.  Inability to tolerate heat.  Unexplained weight loss.  Diarrhea.  Change in the texture of hair or skin.  Changes in heartbeat, such as skipped beats, extra beats, or a rapid heart rate.  Loss of menstruation.  Shaky  hands.  Increased appetite.  Sleep problems. When a goiter is the result of an underactive thyroid (hypothyroidism), symptoms may also include:  Feeling like you have no energy (lethargy).  Inability to tolerate cold.  Weight gain that is not explained by a change in diet or exercise habits.  Dry skin.  Coarse hair.  Irregular menstrual periods.  Constipation.  Sadness or depression.  Fatigue. In some cases, there may not be any symptoms and the thyroid hormone levels may be normal. How is this diagnosed? This condition may be  diagnosed based on your symptoms, your medical history, and a physical exam. You may have tests, such as:  Blood tests to check thyroid function.  Imaging tests, such as: ? Ultrasound. ? CT scan. ? MRI. ? Thyroid scan.  Removal of a tissue sample (biopsy) of the goiter or any nodules. The sample will be tested to check for cancer. How is this treated? Treatment for this condition depends on the cause and your symptoms. Treatment may include:  Medicines to regulate thyroid hormone levels.  Anti-inflammatory medicines or steroid medicines, if the goiter is caused by inflammation.  Iodine supplements or changes to your diet, if the goiter is caused by iodine deficiency.  Radioactive iodine treatment.  Surgery to remove your thyroid. In some cases, you may only need regular check-ups with your health care provider to monitor your condition, and you may not need treatment. Follow these instructions at home:  Follow instructions from your health care provider about any changes to your diet.  Take over-the-counter and prescription medicines only as told by your health care provider. These include supplements.  Do not use any products that contain nicotine or tobacco, such as cigarettes and e-cigarettes. If you need help quitting, ask your health care provider.  Keep all follow-up visits as told by your health care provider. This is important. Contact a health care provider if:  Your symptoms do not get better with treatment.  You have nausea, vomiting, or diarrhea. Get help right away if:  You have sudden, unexplained confusion or other mental changes.  You have a fever.  You have chest pain.  You have trouble breathing or swallowing.  You suddenly become very weak.  You experience extreme restlessness.  You feel your heart racing. Summary  A goiter is an enlarged thyroid gland.  The thyroid gland is located in the lower front of the neck. It makes hormones that  affect many body parts and systems, including the system that affects how quickly the body burns fuel for energy (metabolism).  The main symptom of this condition is swelling in the lower, front part of the neck. This swelling can range from a very small bump to a large lump.  Treatment for this condition depends on the cause and your symptoms. You may need medicines, supplements, or regular monitoring of your condition. This information is not intended to replace advice given to you by your health care provider. Make sure you discuss any questions you have with your health care provider. Document Revised: 08/31/2017 Document Reviewed: 06/14/2017 Elsevier Patient Education  2020 [employer].

## 2020-08-17 LAB — URINALYSIS, ROUTINE W REFLEX MICROSCOPIC
Bacteria, UA: NONE SEEN /HPF
Bilirubin Urine: NEGATIVE
Glucose, UA: NEGATIVE
Hgb urine dipstick: NEGATIVE
Hyaline Cast: NONE SEEN /LPF
Ketones, ur: NEGATIVE
Leukocytes,Ua: NEGATIVE
Nitrite: NEGATIVE
Specific Gravity, Urine: 1.043 — ABNORMAL HIGH (ref 1.001–1.03)
WBC, UA: NONE SEEN /HPF (ref 0–5)
pH: <=5 (ref 5.0–8.0)

## 2020-08-17 LAB — IRON,TIBC AND FERRITIN PANEL
%SAT: 17 % (calc) (ref 16–45)
Ferritin: 31 ng/mL (ref 16–288)
Iron: 65 ug/dL (ref 45–160)
TIBC: 385 mcg/dL (calc) (ref 250–450)

## 2020-08-17 LAB — VITAMIN B12: Vitamin B-12: 228 pg/mL (ref 200–1100)

## 2020-08-17 LAB — MICROALBUMIN / CREATININE URINE RATIO
Creatinine, Urine: 218 mg/dL (ref 20–275)
Microalb Creat Ratio: 8 mcg/mg creat (ref <30)
Microalb, Ur: 1.8 mg/dL

## 2020-08-17 LAB — LIPID PANEL
Cholesterol: 209 mg/dL — ABNORMAL HIGH (ref <200)
HDL: 52 mg/dL (ref >=50)
LDL Cholesterol (Calc): 132 mg/dL (calc) — ABNORMAL HIGH
Non-HDL Cholesterol (Calc): 157 mg/dL (calc) — ABNORMAL HIGH (ref <130)
Total CHOL/HDL Ratio: 4 (calc) (ref <5.0)
Triglycerides: 130 mg/dL (ref <150)

## 2020-08-17 LAB — VITAMIN D 25 HYDROXY (VIT D DEFICIENCY, FRACTURES): Vit D, 25-Hydroxy: 64 ng/mL (ref 30–100)

## 2020-08-17 LAB — THYROID PANEL WITH TSH
Free Thyroxine Index: 2.4 (ref 1.4–3.8)
T3 Uptake: 29 % (ref 22–35)
T4, Total: 8.2 ug/dL (ref 5.1–11.9)
TSH: 1.15 mIU/L (ref 0.40–4.50)

## 2020-08-17 LAB — HEMOGLOBIN A1C
Hgb A1c MFr Bld: 5.6 % of total Hgb (ref <5.7)
Mean Plasma Glucose: 114 (calc)
eAG (mmol/L): 6.3 (calc)

## 2020-08-17 LAB — MAGNESIUM: Magnesium: 2.1 mg/dL (ref 1.5–2.5)

## 2020-09-02 ENCOUNTER — Other Ambulatory Visit: Payer: Self-pay | Admitting: Internal Medicine

## 2020-09-16 ENCOUNTER — Encounter: Payer: Managed Care, Other (non HMO) | Admitting: Adult Health Nurse Practitioner

## 2020-09-23 ENCOUNTER — Other Ambulatory Visit: Payer: Self-pay | Admitting: Internal Medicine

## 2020-09-23 DIAGNOSIS — E559 Vitamin D deficiency, unspecified: Secondary | ICD-10-CM

## 2020-10-28 ENCOUNTER — Encounter: Payer: Self-pay | Admitting: Internal Medicine

## 2020-12-02 ENCOUNTER — Other Ambulatory Visit: Payer: Self-pay | Admitting: Adult Health

## 2020-12-02 ENCOUNTER — Other Ambulatory Visit: Payer: Self-pay | Admitting: Adult Health Nurse Practitioner

## 2020-12-07 NOTE — Progress Notes (Deleted)
FOLLOW UP  Assessment and Plan:   Atherosclerosis of aorta Control blood pressure, cholesterol, glucose, increase exercise.   Hypertension Well controlled at this time off of medications Monitor blood pressure at home; patient to call if consistently greater than 130/80 Continue DASH diet.   Reminder to go to the ER if any CP, SOB, nausea, dizziness, severe HA, changes vision/speech, left arm numbness and tingling and jaw pain.  Cholesterol Currently above goal; working on lifestyle, nowly on rosuvastatin 20 mg daily *** Continue low cholesterol diet and exercise.  Check lipid panel.   Other abnormal glucose Recent A1Cs at goal Discussed diet/exercise, weight management  Defer A1C; check CMP  Overweight with co morbidities Long discussion about weight loss, diet, and exercise Recommended diet heavy in fruits and veggies and low in animal meats, cheeses, and dairy products, appropriate calorie intake Discussed ideal weight for height  Patient will work on maintaining weight through holidays Will follow up in 3 months  Vitamin D Def continue supplementation to maintain goal of 60-100 Defer Vit D level  Depression/anxiety Doing well lexapro/wellbutrin  Lifestyle discussed: diet/exerise, sleep hygiene, stress management, hydration  Psoriatic arthritis Continue follow up with Dr. Amil Amen Continue current medications Monitor closely  Estropia/diplopia No improvement off of wellbutrin per patient; following with [facility] , follow up planned in 2 weeks  If cleared by vision will restart wellbutrin per her strong preference  Incontinence;  *** S/p hysterectomy, known bladder prolapse per patient Discussed treatment options; wants to try kegals for a few months, then would like to pursue pessary Will follow up 3 months, refer GYN if no improvement   Continue diet and meds as discussed. Further disposition pending results of labs. Discussed med's effects and SE's.   Over 30  minutes of exam, counseling, chart review, and critical decision making was performed.   Future Appointments  Date Time Provider [place]  12/08/2020  8:45 AM Liane Comber, NP [facility] None  03/14/2021  8:45 AM Liane Comber, NP [facility] None  08/17/2021  9:00 AM Liane Comber, NP [facility] None    ----------------------------------------------------------------------------------------------------------------------  HPI 65 y.o. female  presents for 6 month follow up on hypertension, cholesterol, glucose management, weight and vitamin D deficiency.   She follows with Dr. Amil Amen for psoriatic arthritis, on MTX and stelara. She also follows with Dr. Sherley Bounds with [facility] for her neck/back, and Dr. Mardelle Matte for her shoulder, just had L replaced Feb 18, 2020 and has done very well, doing PT.   She was on wellbutrin 300 mg daily for recent episode of major depression; She presented in fall 2020 reporting persistent blurry vision, diplopia, had seen Dr. Katy Fitch who ordered MRI which was unremarkable, concern for MG though labs were negative; she was referred to neurology, and again to [facility]/opth specialist at [facility] who felt high dose wellbutrin may have contributed, discontinued this. Reports ongoing double vision, stumbling, has dry eye and watery discharge, discussing possible surgical correction for eye lid weakness. Has follow up with vision provider next week.   She reports mom's dementia is advancing, work has been very stressful. Lexapro 10 mg daily was initiated with some benefit. Low dose wellbutrin 150 mg was restarted per patient preference due to energy and weight benefits ***   BMI is There is no height or weight on file to calculate BMI., she is working on diet and exercise. Wt Readings from Last 3 Encounters:  08/16/20 164 lb 6.4 oz (74.6 kg)  04/13/20 161 lb (73 kg)  12/19/19 158 lb (71.7 kg)   Aortic atherosclerosis incidentally noted on MRI by ortho  Dr. Mardelle Matte, 2021, reported by patient. Pending receipt of MRI report, requested.   Her blood pressure has been controlled at home, today their BP is    She does not workout. She denies chest pain, shortness of breath, dizziness.   She is on cholesterol medication. Her cholesterol is not at goal, newly transitioned from pravastatin to rosuvastatin 5 mg daily. The cholesterol last visit was:   Lab Results  Component Value Date   CHOL 209 (H) 08/16/2020   HDL 52 08/16/2020   LDLCALC 132 (H) 08/16/2020   TRIG 130 08/16/2020   CHOLHDL 4.0 08/16/2020    She has not been working on diet and exercise for glucose management, and denies increased appetite, nausea, paresthesia of the feet, polydipsia, polyuria and visual disturbances. Last A1C in the office was:  Lab Results  Component Value Date   HGBA1C 5.6 08/16/2020   Patient is on Vitamin D supplement, at goal at last check  Lab Results  Component Value Date   VD25OH 64 08/16/2020        Current Medications:  Current Outpatient Medications on File Prior to Visit  Medication Sig  . acetaminophen (TYLENOL) 500 MG tablet Take by mouth every 6 (six) hours as needed.  Marland Kitchen buPROPion (WELLBUTRIN XL) 150 MG 24 hr tablet Take 1 tablet (150 mg total) by mouth every morning.  . escitalopram (LEXAPRO) 10 MG tablet TAKE 1 TABLET BY MOUTH DAILY FOR MOOD  . famotidine (PEPCID) 20 MG tablet Take 1 tablet (20 mg total) by mouth 2 (two) times daily before a meal. For reflux.  . folic acid (FOLVITE) 1 MG tablet Take 1 mg by mouth daily.  . methotrexate (RHEUMATREX) 10 MG tablet Take 10 mg by mouth. Caution: Chemotherapy. Protect from light. Takes 12 tablets a week.  Marland Kitchen PROCTOSOL HC 2.5 % rectal cream PLACE 1 APPLICATION RECTALLY 2 (TWO) TIMES DAILY.  . rosuvastatin (CRESTOR) 5 MG tablet Take 1 tab daily or as directed for cholesterol.  . ustekinumab (STELARA) 45 MG/0.5ML SOSY injection Inject 45 mg into the skin. Injects once every quarter  . Vitamin D,  Ergocalciferol, (DRISDOL) 1.25 MG (50000 UNIT) CAPS capsule TAKE 1 CAPSULE BY MOUTH 3X PER WEEK FOR VIT D DEFICIENCEY   No current facility-administered medications on file prior to visit.     Allergies: No Known Allergies   Medical History:  Past Medical History:  Diagnosis Date  . Allergy   . Left thyroid nodule 03/31/2013   Benign per thyroid biopsy in 2014  . Mass of chest wall 12/2012   tender to the touch  . Osteoarthritis   . Seasonal allergies    Family history- Reviewed and unchanged Social history- Reviewed and unchanged   Review of Systems:  Review of Systems  Constitutional: Negative for malaise/fatigue and weight loss.  HENT: Negative for hearing loss and tinnitus.   Eyes: Positive for blurred vision, double vision and redness.       Dry eyes  Respiratory: Negative for cough, shortness of breath and wheezing.   Cardiovascular: Negative for chest pain, palpitations, orthopnea, claudication and leg swelling.  Gastrointestinal: Negative for abdominal pain, blood in stool, constipation, diarrhea, heartburn, melena, nausea and vomiting.  Genitourinary: Positive for frequency. Negative for dysuria, flank pain, hematuria and urgency.       Urinary leakage, stress incontinence, known bladder prolapse per patient   Musculoskeletal: Negative for joint pain and myalgias.  Skin: Negative for rash.  Neurological: Positive for focal weakness (eyes). Negative for dizziness, tingling, sensory change, weakness and headaches.  Endo/Heme/Allergies: Negative for polydipsia.  Psychiatric/Behavioral: Positive for depression. Negative for hallucinations, substance abuse and suicidal ideas. The patient is not nervous/anxious and does not have insomnia.   All other systems reviewed and are negative.   Physical Exam: There were no vitals taken for this visit. Wt Readings from Last 3 Encounters:  08/16/20 164 lb 6.4 oz (74.6 kg)  04/13/20 161 lb (73 kg)  12/19/19 158 lb (71.7 kg)    General Appearance: Well nourished, in no apparent distress. Eyes: PERRLA, EOMs abn (see neuro), conjunctiva appear mildly erythematous/irritated without discharge Sinuses: No Frontal/maxillary tenderness ENT/Mouth: Ext aud canals clear, TMs without erythema, bulging. No erythema, swelling, or exudate on post pharynx.  Tonsils not swollen or erythematous. Hearing normal.  Neck: Supple, thyroid normal.  Respiratory: Respiratory effort normal, BS equal bilaterally without rales, rhonchi, wheezing or stridor.  Cardio: RRR with no MRGs. Brisk peripheral pulses without edema.  Abdomen: Soft, + BS.  Non tender, no guarding, rebound, hernias, masses. Lymphatics: Non tender without lymphadenopathy.  Musculoskeletal: Full ROM, 5/5 strength, Normal gait Skin: Warm, dry without rashes, lesions, ecchymosis.  Neuro: CN intact excepting bil interior oblique overreaction with strabismus R>L; reflexes equal bilaterally. Normal muscle tone, no cerebellar symptoms. Sensation intact.  Psych: Awake and oriented X 3, tearful affect, Insight and Judgment appropriate.    Izora Ribas, NP 8:17 AM [facility]

## 2020-12-08 ENCOUNTER — Ambulatory Visit: Payer: Managed Care, Other (non HMO) | Admitting: Adult Health

## 2021-01-15 ENCOUNTER — Other Ambulatory Visit: Payer: Self-pay | Admitting: Adult Health

## 2021-01-25 NOTE — Progress Notes (Signed)
FOLLOW UP  Assessment and Plan:   Hypertension Well controlled at this time off of medications Monitor blood pressure at home; patient to call if consistently greater than 130/80 Continue DASH diet.   Reminder to go to the ER if any CP, SOB, nausea, dizziness, severe HA, changes vision/speech, left arm numbness and tingling and jaw pain.  Cholesterol Currently above goal; working on lifestyle, newly on rosuvastatin 5 mg daily  Continue low cholesterol diet and exercise.  Check lipid panel.   Other abnormal glucose Recent A1Cs at goal Discussed diet/exercise, weight management  Defer A1C; check CMP  Overweight with co morbidities Long discussion about weight loss, diet, and exercise Recommended diet heavy in fruits and veggies and low in animal meats, cheeses, and dairy products, appropriate calorie intake Discussed ideal weight for height  Patient has done well with phentermine in the past, requests to restart low dose; PDMP reviewed and sent in;  Will follow up in 3 months  Vitamin D Def Below goal at last visit; has increased dose continue supplementation to maintain goal of 70-100 Check Vit D level  Depression/anxiety Lexapro 10 mg and wellbutrin 150 mg and doing well  Lifestyle discussed: diet/exerise, sleep hygiene, stress management, hydration  Psoriatic arthritis Continue follow up with Dr. Amil Amen Continue current medications Monitor closely  Estropia/diplopia No improvement off of wellbutrin; following with [facility]  Stable/improving; monitor   Continue diet and meds as discussed. Further disposition pending results of labs. Discussed med's effects and SE's.   Over 30 minutes of exam, counseling, chart review, and critical decision making was performed.   Future Appointments  Date Time Provider [place]  03/14/2021  8:45 AM Liane Comber, NP [facility] None  08/17/2021  9:00 AM Liane Comber, NP [facility] None     ----------------------------------------------------------------------------------------------------------------------  HPI 65 y.o. female  presents for 6 month follow up on hypertension, cholesterol, glucose management, weight and vitamin D deficiency.   She follows with Dr. Amil Amen for psoriatic arthritis, on MTX and stelara, deferring retirement due to drug costs. She also follows with Dr. Sherley Bounds with [facility] for her neck/back, and Dr. Mardelle Matte for her shoulder, just had L replaced Feb 18, 2020 and has done very well.   She was on wellbutrin 300 mg daily for major depression; She presented in fall 2020 reporting persistent blurry vision, diplopia, had seen Dr. Katy Fitch who ordered MRI which was unremarkable, concern for MG though labs were negative; she was referred to neurology, and again to neuro/opth specialist at [facility] who felt high dose wellbutrin may have contributed, discontinued this but without significant improvement. Has ongoing double vision, has dry eye and watery discharge, improving with steroids, discussing possible surgical correction for eye lid weakness, seeing a new provider in [place], - Dr. Langley Adie with [facility]. No changes and monitoring at this time  She reports mom's dementia is advancing, work has been very stressful but managing well. On low dose lexapro and wellbutrin for mood with good results, has been working 80+ hours, finally put in notice to leave work yesterday.   BMI is Body mass index is 30.06 kg/m., she is working on diet and exercise, reports at home was up to 177 lb, tracking steps, has been working to get back down, personal goal of 135 lb or so.  Wt Readings from Last 3 Encounters:  01/26/21 167 lb (75.8 kg)  08/16/20 164 lb 6.4 oz (74.6 kg)  04/13/20 161 lb (73 kg)   Aortic atherosclerosis incidentally noted on  MRI by ortho Dr. Mardelle Matte, 2021, reported by patient. MRI has been requested but never received.  Her blood pressure has  been controlled at home, today their BP is BP: 122/78  She does not workout. She denies chest pain, shortness of breath, dizziness.   She is on cholesterol medication. Her cholesterol is not at goal, newly on rosuvastatin 5 mg daily. The cholesterol last visit was:   Lab Results  Component Value Date   CHOL 209 (H) 08/16/2020   HDL 52 08/16/2020   LDLCALC 132 (H) 08/16/2020   TRIG 130 08/16/2020   CHOLHDL 4.0 08/16/2020    She has not been working on diet and exercise for glucose management, and denies increased appetite, nausea, paresthesia of the feet, polydipsia, polyuria and visual disturbances. Last A1C in the office was:  Lab Results  Component Value Date   HGBA1C 5.6 08/16/2020   Patient is on Vitamin D supplement, at goal at last check  Lab Results  Component Value Date   VD25OH 64 08/16/2020        Current Medications:  Current Outpatient Medications on File Prior to Visit  Medication Sig  . acetaminophen (TYLENOL) 500 MG tablet Take by mouth every 6 (six) hours as needed.  Marland Kitchen buPROPion (WELLBUTRIN XL) 150 MG 24 hr tablet Take 1 tablet (150 mg total) by mouth every morning.  . escitalopram (LEXAPRO) 10 MG tablet TAKE 1 TABLET BY MOUTH DAILY FOR MOOD  . famotidine (PEPCID) 20 MG tablet Take 1 tablet (20 mg total) by mouth 2 (two) times daily before a meal. For reflux.  . folic acid (FOLVITE) 1 MG tablet Take 1 mg by mouth daily.  . methotrexate (RHEUMATREX) 10 MG tablet Take 10 mg by mouth. Caution: Chemotherapy. Protect from light. Takes 12 tablets a week.  Marland Kitchen PROCTOSOL HC 2.5 % rectal cream PLACE 1 APPLICATION RECTALLY 2 (TWO) TIMES DAILY.  . rosuvastatin (CRESTOR) 5 MG tablet Take  1 tablet  Daily  for Cholesterol  . ustekinumab (STELARA) 45 MG/0.5ML SOSY injection Inject 45 mg into the skin. Injects once every quarter  . Vitamin D, Ergocalciferol, (DRISDOL) 1.25 MG (50000 UNIT) CAPS capsule TAKE 1 CAPSULE BY MOUTH 3X PER WEEK FOR VIT D DEFICIENCEY   No current  facility-administered medications on file prior to visit.     Allergies: No Known Allergies   Medical History:  Past Medical History:  Diagnosis Date  . Allergy   . Left thyroid nodule 03/31/2013   Benign per thyroid biopsy in 2014  . Mass of chest wall 12/2012   tender to the touch  . Osteoarthritis   . Seasonal allergies    Family history- Reviewed and unchanged Social history- Reviewed and unchanged   Review of Systems:  Review of Systems  Constitutional: Negative for malaise/fatigue and weight loss.  HENT: Negative for hearing loss and tinnitus.   Eyes: Positive for blurred vision, double vision and redness.       Dry eyes  Respiratory: Negative for cough, shortness of breath and wheezing.   Cardiovascular: Negative for chest pain, palpitations, orthopnea, claudication and leg swelling.  Gastrointestinal: Negative for abdominal pain, blood in stool, constipation, diarrhea, heartburn, melena, nausea and vomiting.  Genitourinary: Negative for dysuria, flank pain, frequency, hematuria and urgency.       Urinary leakage, stress incontinence, known bladder prolapse per patient   Musculoskeletal: Negative for joint pain and myalgias.  Skin: Negative for rash.  Neurological: Positive for focal weakness (eyes). Negative for dizziness, tingling, sensory  change, weakness and headaches.  Endo/Heme/Allergies: Negative for polydipsia.  Psychiatric/Behavioral: Negative for depression (improved), hallucinations, substance abuse and suicidal ideas. The patient is not nervous/anxious and does not have insomnia.   All other systems reviewed and are negative.   Physical Exam: BP 122/78   Pulse 70   Temp (!) 97.3 F (36.3 C)   Wt 167 lb (75.8 kg)   SpO2 99%   BMI 30.06 kg/m  Wt Readings from Last 3 Encounters:  01/26/21 167 lb (75.8 kg)  08/16/20 164 lb 6.4 oz (74.6 kg)  04/13/20 161 lb (73 kg)   General Appearance: Well nourished, in no apparent distress. Eyes: PERRLA, EOMs abn  (see neuro), conjunctiva appear mildly erythematous/irritated without discharge Sinuses: No Frontal/maxillary tenderness ENT/Mouth: Ext aud canals clear, TMs without erythema, bulging. No erythema, swelling, or exudate on post pharynx.  Tonsils not swollen or erythematous. Hearing normal.  Neck: Supple, thyroid normal.  Respiratory: Respiratory effort normal, BS equal bilaterally without rales, rhonchi, wheezing or stridor.  Cardio: RRR with no MRGs. Brisk peripheral pulses without edema.  Abdomen: Soft, + BS.  Non tender, no guarding, rebound, hernias, masses. Lymphatics: Non tender without lymphadenopathy.  Musculoskeletal: Full ROM, 5/5 strength, Normal gait Skin: Warm, dry without rashes, lesions, ecchymosis.  Neuro: CN intact excepting bil interior oblique overreaction with strabismus R>L; reflexes equal bilaterally. Normal muscle tone, no cerebellar symptoms. Sensation intact.  Psych: Awake and oriented X 3, normal affect, Insight and Judgment appropriate.    Izora Ribas, NP 9:01 AM [facility]

## 2021-01-26 ENCOUNTER — Encounter: Payer: Self-pay | Admitting: Adult Health

## 2021-01-26 ENCOUNTER — Ambulatory Visit: Payer: BC Managed Care – PPO | Admitting: Adult Health

## 2021-01-26 ENCOUNTER — Other Ambulatory Visit: Payer: Self-pay

## 2021-01-26 VITALS — BP 122/78 | HR 70 | Temp 97.3°F | Wt 167.0 lb

## 2021-01-26 DIAGNOSIS — L4050 Arthropathic psoriasis, unspecified: Secondary | ICD-10-CM

## 2021-01-26 DIAGNOSIS — I10 Essential (primary) hypertension: Secondary | ICD-10-CM | POA: Diagnosis not present

## 2021-01-26 DIAGNOSIS — R809 Proteinuria, unspecified: Secondary | ICD-10-CM

## 2021-01-26 DIAGNOSIS — Z79899 Other long term (current) drug therapy: Secondary | ICD-10-CM | POA: Diagnosis not present

## 2021-01-26 DIAGNOSIS — E559 Vitamin D deficiency, unspecified: Secondary | ICD-10-CM

## 2021-01-26 DIAGNOSIS — F3341 Major depressive disorder, recurrent, in partial remission: Secondary | ICD-10-CM

## 2021-01-26 DIAGNOSIS — E663 Overweight: Secondary | ICD-10-CM

## 2021-01-26 DIAGNOSIS — I700 Atherosclerosis of aorta: Secondary | ICD-10-CM | POA: Diagnosis not present

## 2021-01-26 DIAGNOSIS — E785 Hyperlipidemia, unspecified: Secondary | ICD-10-CM

## 2021-01-26 DIAGNOSIS — R7309 Other abnormal glucose: Secondary | ICD-10-CM

## 2021-01-26 MED ORDER — PHENTERMINE HCL 37.5 MG PO TABS
ORAL_TABLET | ORAL | 2 refills | Status: DC
Start: 2021-01-26 — End: 2022-05-03

## 2021-01-26 NOTE — Patient Instructions (Addendum)
High-Fiber Eating Plan Fiber, also called dietary fiber, is a type of carbohydrate. It is found foods such as fruits, vegetables, whole grains, and beans. A high-fiber diet can have many health benefits. Your health care provider may recommend a high-fiber diet to help:  Prevent constipation. Fiber can make your bowel movements more regular.  Lower your cholesterol.  Relieve the following conditions: ? Inflammation of veins in the anus (hemorrhoids). ? Inflammation of specific areas of the digestive tract (uncomplicated diverticulosis). ? A problem of the large intestine, also called the colon, that sometimes causes pain and diarrhea (irritable bowel syndrome, or IBS).  Prevent overeating as part of a weight-loss plan.  Prevent heart disease, type 2 diabetes, and certain cancers. What are tips for following this plan? Reading food labels  Check the nutrition facts label on food products for the amount of dietary fiber. Choose foods that have 5 grams of fiber or more per serving.  The goals for recommended daily fiber intake include: ? Men (age 50 or younger): 34-38 g. ? Men (over age 50): 28-34 g. ? Women (age 50 or younger): 25-28 g. ? Women (over age 50): 22-25 g. Your daily fiber goal is _____________ g.   Shopping  Choose whole fruits and vegetables instead of processed forms, such as apple juice or applesauce.  Choose a wide variety of high-fiber foods such as avocados, lentils, oats, and kidney beans.  Read the nutrition facts label of the foods you choose. Be aware of foods with added fiber. These foods often have high sugar and sodium amounts per serving. Cooking  Use whole-grain flour for baking and cooking.  Cook with brown rice instead of white rice. Meal planning  Start the day with a breakfast that is high in fiber, such as a cereal that contains 5 g of fiber or more per serving.  Eat breads and cereals that are made with whole-grain flour instead of refined  flour or white flour.  Eat brown rice, bulgur wheat, or millet instead of white rice.  Use beans in place of meat in soups, salads, and pasta dishes.  Be sure that half of the grains you eat each day are whole grains. General information  You can get the recommended daily intake of dietary fiber by: ? Eating a variety of fruits, vegetables, grains, nuts, and beans. ? Taking a fiber supplement if you are not able to take in enough fiber in your diet. It is better to get fiber through food than from a supplement.  Gradually increase how much fiber you consume. If you increase your intake of dietary fiber too quickly, you may have bloating, cramping, or gas.  Drink plenty of water to help you digest fiber.  Choose high-fiber snacks, such as berries, raw vegetables, nuts, and popcorn. What foods should I eat? Fruits Berries. Pears. Apples. Oranges. Avocado. Prunes and raisins. Dried figs. Vegetables Sweet potatoes. Spinach. Kale. Artichokes. Cabbage. Broccoli. Cauliflower. Green peas. Carrots. Squash. Grains Whole-grain breads. Multigrain cereal. Oats and oatmeal. Brown rice. Barley. Bulgur wheat. Millet. Quinoa. Bran muffins. Popcorn. Rye wafer crackers. Meats and other proteins Navy beans, kidney beans, and pinto beans. Soybeans. Split peas. Lentils. Nuts and seeds. Dairy Fiber-fortified yogurt. Beverages Fiber-fortified soy milk. Fiber-fortified orange juice. Other foods Fiber bars. The items listed above may not be a complete list of recommended foods and beverages. Contact a dietitian for more information. What foods should I avoid? Fruits Fruit juice. Cooked, strained fruit. Vegetables Fried potatoes. Canned vegetables. Well-cooked vegetables. Grains   White bread. Pasta made with refined flour. White rice. Meats and other proteins Fatty cuts of meat. Fried chicken or fried fish. Dairy Milk. Yogurt. Cream cheese. Sour cream. Fats and oils Butters. Beverages Soft  drinks. Other foods Cakes and pastries. The items listed above may not be a complete list of foods and beverages to avoid. Talk with your dietitian about what choices are best for you. Summary  Fiber is a type of carbohydrate. It is found in foods such as fruits, vegetables, whole grains, and beans.  A high-fiber diet has many benefits. It can help to prevent constipation, lower blood cholesterol, aid weight loss, and reduce your risk of heart disease, diabetes, and certain cancers.  Increase your intake of fiber gradually. Increasing fiber too quickly may cause cramping, bloating, and gas. Drink plenty of water while you increase the amount of fiber you consume.  The best sources of fiber include whole fruits and vegetables, whole grains, nuts, seeds, and beans. This information is not intended to replace advice given to you by your health care provider. Make sure you discuss any questions you have with your health care provider. Document Revised: 01/22/2020 Document Reviewed: 01/22/2020 Elsevier Patient Education  2021 [employer]  

## 2021-01-27 LAB — COMPLETE METABOLIC PANEL WITH GFR
AG Ratio: 1.9 (calc) (ref 1.0–2.5)
ALT: 16 U/L (ref 6–29)
AST: 16 U/L (ref 10–35)
Albumin: 4.2 g/dL (ref 3.6–5.1)
Alkaline phosphatase (APISO): 61 U/L (ref 37–153)
BUN: 10 mg/dL (ref 7–25)
CO2: 25 mmol/L (ref 20–32)
Calcium: 9.5 mg/dL (ref 8.6–10.4)
Chloride: 107 mmol/L (ref 98–110)
Creat: 0.72 mg/dL (ref 0.50–0.99)
GFR, Est African American: 101 mL/min/{1.73_m2} (ref >=60)
GFR, Est Non African American: 87 mL/min/{1.73_m2} (ref >=60)
Globulin: 2.2 g/dL (calc) (ref 1.9–3.7)
Glucose, Bld: 97 mg/dL (ref 65–99)
Potassium: 4.6 mmol/L (ref 3.5–5.3)
Sodium: 140 mmol/L (ref 135–146)
Total Bilirubin: 0.4 mg/dL (ref 0.2–1.2)
Total Protein: 6.4 g/dL (ref 6.1–8.1)

## 2021-01-27 LAB — CBC WITH DIFFERENTIAL/PLATELET
Absolute Monocytes: 437 cells/uL (ref 200–950)
Basophils Absolute: 41 cells/uL (ref 0–200)
Basophils Relative: 0.7 %
Eosinophils Absolute: 236 cells/uL (ref 15–500)
Eosinophils Relative: 4 %
HCT: 38.5 % (ref 35.0–45.0)
Hemoglobin: 13 g/dL (ref 11.7–15.5)
Lymphs Abs: 1652 cells/uL (ref 850–3900)
MCH: 31.2 pg (ref 27.0–33.0)
MCHC: 33.8 g/dL (ref 32.0–36.0)
MCV: 92.3 fL (ref 80.0–100.0)
MPV: 11.2 fL (ref 7.5–12.5)
Monocytes Relative: 7.4 %
Neutro Abs: 3534 cells/uL (ref 1500–7800)
Neutrophils Relative %: 59.9 %
Platelets: 265 10*3/uL (ref 140–400)
RBC: 4.17 10*6/uL (ref 3.80–5.10)
RDW: 12.1 % (ref 11.0–15.0)
Total Lymphocyte: 28 %
WBC: 5.9 10*3/uL (ref 3.8–10.8)

## 2021-01-27 LAB — URINALYSIS, ROUTINE W REFLEX MICROSCOPIC
Bilirubin Urine: NEGATIVE
Glucose, UA: NEGATIVE
Hgb urine dipstick: NEGATIVE
Ketones, ur: NEGATIVE
Leukocytes,Ua: NEGATIVE
Nitrite: NEGATIVE
Protein, ur: NEGATIVE
Specific Gravity, Urine: 1.016 (ref 1.001–1.035)
pH: <=5 (ref 5.0–8.0)

## 2021-01-27 LAB — LIPID PANEL
Cholesterol: 202 mg/dL — ABNORMAL HIGH (ref <200)
HDL: 50 mg/dL (ref >=50)
LDL Cholesterol (Calc): 128 mg/dL (calc) — ABNORMAL HIGH
Non-HDL Cholesterol (Calc): 152 mg/dL (calc) — ABNORMAL HIGH (ref <130)
Total CHOL/HDL Ratio: 4 (calc) (ref <5.0)
Triglycerides: 129 mg/dL (ref <150)

## 2021-01-27 LAB — MAGNESIUM: Magnesium: 2.1 mg/dL (ref 1.5–2.5)

## 2021-01-27 LAB — TSH: TSH: 1.68 mIU/L (ref 0.40–4.50)

## 2021-02-21 ENCOUNTER — Other Ambulatory Visit: Payer: Self-pay | Admitting: Adult Health

## 2021-03-14 ENCOUNTER — Ambulatory Visit: Payer: Managed Care, Other (non HMO) | Admitting: Adult Health

## 2021-05-05 ENCOUNTER — Ambulatory Visit: Payer: BC Managed Care – PPO | Admitting: Adult Health

## 2021-06-16 ENCOUNTER — Ambulatory Visit: Payer: BC Managed Care – PPO | Admitting: Adult Health

## 2021-06-20 ENCOUNTER — Other Ambulatory Visit: Payer: Self-pay

## 2021-06-20 ENCOUNTER — Emergency Department (HOSPITAL_COMMUNITY): Payer: BC Managed Care – PPO

## 2021-06-20 ENCOUNTER — Emergency Department (HOSPITAL_COMMUNITY)
Admission: EM | Admit: 2021-06-20 | Discharge: 2021-06-21 | Disposition: A | Discharge status: Home / Self Care | Payer: BC Managed Care – PPO | Attending: Emergency Medicine | Admitting: Emergency Medicine

## 2021-06-20 DIAGNOSIS — Z96612 Presence of left artificial shoulder joint: Secondary | ICD-10-CM | POA: Insufficient documentation

## 2021-06-20 DIAGNOSIS — J069 Acute upper respiratory infection, unspecified: Secondary | ICD-10-CM | POA: Diagnosis not present

## 2021-06-20 DIAGNOSIS — U071 COVID-19: Secondary | ICD-10-CM | POA: Diagnosis not present

## 2021-06-20 DIAGNOSIS — R519 Headache, unspecified: Secondary | ICD-10-CM | POA: Diagnosis not present

## 2021-06-20 DIAGNOSIS — Z96611 Presence of right artificial shoulder joint: Secondary | ICD-10-CM | POA: Insufficient documentation

## 2021-06-20 DIAGNOSIS — R9431 Abnormal electrocardiogram [ECG] [EKG]: Secondary | ICD-10-CM | POA: Diagnosis not present

## 2021-06-20 DIAGNOSIS — R0602 Shortness of breath: Secondary | ICD-10-CM | POA: Diagnosis not present

## 2021-06-20 HISTORY — DX: Arthropathic psoriasis, unspecified: L40.50

## 2021-06-20 MED ORDER — ALBUTEROL SULFATE HFA 108 (90 BASE) MCG/ACT IN AERS: 2.0000 | INHALATION_SPRAY | RESPIRATORY_TRACT | Status: DC | PRN

## 2021-06-21 ENCOUNTER — Telehealth: Payer: Self-pay

## 2021-06-21 ENCOUNTER — Other Ambulatory Visit: Payer: Self-pay | Admitting: Adult Health

## 2021-06-21 ENCOUNTER — Encounter (HOSPITAL_COMMUNITY): Payer: Self-pay | Admitting: Emergency Medicine

## 2021-06-21 LAB — RESP PANEL BY RT-PCR (FLU A&B, COVID) ARPGX2
Influenza A by PCR: NEGATIVE
Influenza B by PCR: NEGATIVE
SARS Coronavirus 2 by RT PCR: POSITIVE — AB

## 2021-06-21 MED ORDER — MOLNUPIRAVIR EUA 200MG CAPSULE: 4.0000 | ORAL_CAPSULE | Freq: Two times a day (BID) | ORAL | 0 refills | Status: AC

## 2021-06-21 MED ORDER — BENZONATATE 200 MG PO CAPS: ORAL_CAPSULE | ORAL | 1 refills | Status: DC

## 2021-06-21 MED ORDER — PROMETHAZINE-DM 6.25-15 MG/5ML PO SYRP: 5.0000 mL | ORAL_SOLUTION | Freq: Four times a day (QID) | ORAL | 1 refills | Status: DC | PRN

## 2021-06-21 NOTE — ED Provider Notes (Signed)
[facility] DEPT Provider Note: Gabrielle Spurling, MD, FACEP  CSN: [id number] MRN: [id number] ARRIVAL: 06/20/21 at 2049 ROOM: [place]  06/21/21 3:01 AM Gabrielle Duncan is a 65 y.o. female who began feeling ill the evening before yesterday.  Specifically she has had headache, nasal congestion, cough, shortness of breath, loss of taste and smell and body aches.  The body aches are characterized as an exacerbation of her chronic osteoarthritis and psoriatic arthritis.  She rates her body pain as moderate to severe.  She has had multiple sick contacts at work and took a [place] test which was positive.  She is on methotrexate and Stelara for her arthritis.   Past Medical History:  Diagnosis Date   Allergy    Left thyroid nodule 03/31/2013   Benign per thyroid biopsy in 2014   Mass of chest wall 12/2012   tender to the touch   Osteoarthritis    Psoriatic arthritis ([place])    Seasonal allergies     Past Surgical History:  Procedure Laterality Date   ABDOMINAL HYSTERECTOMY  1997   complete   ANTERIOR CERVICAL DECOMP/DISCECTOMY FUSION  08/08/2012   Procedure: ANTERIOR CERVICAL DECOMPRESSION/DISCECTOMY FUSION 2 LEVEL/HARDWARE REMOVAL;  Surgeon: Eustace Moore, MD;  Location: [facility];  Service: Neurosurgery;  Laterality: N/A;  anterior cervical five-six to six seven decompression fusion with removal plate four-five   CERVICAL FUSION  2005   CHOLECYSTECTOMY  1997   EYE SURGERY     for dry eye syndrome   HEMORRHOID SURGERY N/A 09/02/2013   Procedure: HEMORRHOIDECTOMY;  Surgeon: Joyice Faster. Cornett, MD;  Location: [facility];  Service: General;  Laterality: N/A;   LUMBAR FUSION  01/01/2013   L4-5   LUMBAR LAMINECTOMY/DECOMPRESSION MICRODISCECTOMY  01/01/2013   L4-5   MASS EXCISION N/A 01/29/2013   Procedure: EXCISION of chest wall mass 8 cm;  Surgeon: Stark Klein, MD;  Location: [facility];  Service: General;  Laterality: N/A;   REDUCTION MAMMAPLASTY Bilateral    SHOULDER SURGERY  03/2012   debridement and partial clavical removal   TOTAL SHOULDER ARTHROPLASTY  09/24/2012   Procedure: TOTAL SHOULDER ARTHROPLASTY;  Surgeon: Johnny Bridge, MD;  Location: [facility];  Service: Orthopedics;  Laterality: Right;   TOTAL SHOULDER ARTHROPLASTY Left 02/18/2020   Dr. Mardelle Matte    Family History  Problem Relation Age of Onset   Dementia Mother    CAD Mother 33       triple bypass   Thyroid cancer Mother    Pancreatitis Father    Diabetes Father    Heart disease Father    Thyroid disease Maternal Grandmother    Goiter Maternal Grandmother    Lung cancer Maternal Grandfather 5       smoker   Ovarian cancer Paternal Grandmother 28   Cancer Paternal Grandfather        Unknown origin with mets   Breast cancer Cousin    Breast cancer Cousin    Breast cancer Maternal Aunt        x3   Asthma Daughter    Thyroid disease Other        several maternal relatives    Social History   Tobacco Use   Smoking status: Never   Smokeless tobacco: Never  Vaping Use   Vaping Use: Never used  Substance Use Topics   Alcohol use: No   Drug  use: No    Prior to Admission medications   Medication Sig Start Date End Date Taking? Authorizing Provider  molnupiravir EUA (LAGEVRIO) 200 mg CAPS capsule Take 4 capsules (800 mg total) by mouth 2 (two) times daily for 5 days. 06/21/21 06/26/21 Yes Keilah Lemire, MD  acetaminophen (TYLENOL) 500 MG tablet Take by mouth every 6 (six) hours as needed.    [provider]  buPROPion (WELLBUTRIN XL) 150 MG 24 hr tablet TAKE 1 TABLET BY MOUTH EVERY DAY IN THE MORNING 02/21/21   Liane Comber, NP  escitalopram (LEXAPRO) 10 MG tablet TAKE 1 TABLET BY MOUTH DAILY FOR MOOD 12/02/20   Liane Comber, NP  famotidine (PEPCID) 20 MG tablet Take 1 tablet (20 mg total) by mouth 2 (two) times daily before a meal. For reflux. 08/16/20 08/16/21  Liane Comber, NP  folic acid (FOLVITE) 1 MG tablet Take 1 mg by mouth daily.    [provider]  methotrexate (RHEUMATREX) 10 MG tablet Take 10 mg by mouth. Caution: Chemotherapy. Protect from light. Takes 12 tablets a week.    [provider]  phentermine (ADIPEX-P) 37.5 MG tablet Take 1/2 to 1 tablet every morning as needed for dieting & weight loss 01/26/21   Liane Comber, NP  PROCTOSOL HC 2.5 % rectal cream PLACE 1 APPLICATION RECTALLY 2 (TWO) TIMES DAILY. 05/11/20   Liane Comber, NP  rosuvastatin (CRESTOR) 5 MG tablet Take  1 tablet  Daily  for Cholesterol 01/15/21   Unk Pinto, MD  ustekinumab [facility]) 45 MG/0.5ML SOSY injection Inject 45 mg into the skin. Injects once every quarter    [provider]  Vitamin D, Ergocalciferol, (DRISDOL) 1.25 MG (50000 UNIT) CAPS capsule TAKE 1 CAPSULE BY MOUTH 3X PER WEEK FOR VIT D DEFICIENCEY 09/23/20   Liane Comber, NP    Allergies Patient has no known allergies.   REVIEW OF SYSTEMS  Negative except as noted here or in the History of Present Illness.   PHYSICAL EXAMINATION  Initial Vital Signs Blood pressure (!) 147/89, pulse 81, temperature 98.3 F (36.8 C), temperature source Oral, resp. rate 18, height 5\' 6"  (1.676 m), weight 68 kg, SpO2 99 %.  Examination General: Well-developed, well-nourished female in no acute distress; appearance consistent with age of record HENT: normocephalic; atraumatic Eyes: pupils equal, round and reactive to light; extraocular muscles intact Neck: supple Heart: regular rate and rhythm Lungs: clear to auscultation bilaterally Abdomen: soft; nondistended; nontender; bowel sounds present Extremities: No deformity; full range of motion Neurologic: Awake, alert and oriented; motor function intact in all extremities and symmetric; no facial droop Skin: Warm and dry Psychiatric: Normal mood and affect   RESULTS  Summary of this visit's results, reviewed and interpreted by  myself:   EKG Interpretation  Date/Time:    Ventricular Rate:    PR Interval:    QRS Duration:   QT Interval:    QTC Calculation:   R Axis:     Text Interpretation:         Laboratory Studies: Results for orders placed or performed during the hospital encounter of 06/20/21 (from the past 24 hour(s))  Resp Panel by RT-PCR (Flu A&B, Covid) Nasopharyngeal Swab     Status: Abnormal   Collection Time: 06/21/21  1:12 AM   Specimen: Nasopharyngeal Swab; Nasopharyngeal(NP) swabs in vial transport medium  Result Value Ref Range   SARS Coronavirus 2 by RT PCR POSITIVE (A) NEGATIVE   Influenza A by PCR NEGATIVE NEGATIVE   Influenza B  by PCR NEGATIVE NEGATIVE   Imaging Studies: DG Chest 2 View  Result Date: 06/20/2021 CLINICAL DATA:  Shortness of breath. EXAM: CHEST - 2 VIEW COMPARISON:  None. FINDINGS: The lungs are clear. There is no pleural effusion pneumothorax. The cardiac silhouette is within limits. Bilateral shoulder arthroplasties and lower cervical fusion hardware. No acute osseous pathology. IMPRESSION: No active cardiopulmonary disease. Electronically Signed   By: Anner Crete M.D.   On: 06/20/2021 22:32    ED COURSE and MDM  Nursing notes, initial and subsequent vitals signs, including pulse oximetry, reviewed and interpreted by myself.  Vitals:   06/20/21 2141 06/20/21 2142  BP: (!) 147/89   Pulse: 81   Resp: 18   Temp: 98.3 F (36.8 C)   TempSrc: Oral   SpO2: 99%   Weight:  68 kg  Height:  5\' 6"  (1.676 m)   Medications  albuterol (VENTOLIN HFA) 108 (90 Base) MCG/ACT inhaler 2 puff (has no administration in time range)   We will start the patient on molnupiravir for Covid given her risk factors including immunosuppressive drugs.  We will also provide an albuterol inhaler for her dyspnea although she has no wheezing on exam.   PROCEDURES  Procedures   ED DIAGNOSES     ICD-10-CM   1. Acute respiratory disease due to COVID-19 virus  U07.1    J06.9           Christain Niznik, MD 06/21/21 [phone]

## 2021-06-21 NOTE — Telephone Encounter (Signed)
Patient was seen yesterday in the ED and diagnosed with Covid. Requesting cough syrup be sent to the pharmacy for her. Please advise.

## 2021-07-19 ENCOUNTER — Encounter: Payer: Managed Care, Other (non HMO) | Admitting: Adult Health Nurse Practitioner

## 2021-07-27 ENCOUNTER — Encounter: Payer: Managed Care, Other (non HMO) | Admitting: Adult Health Nurse Practitioner

## 2021-07-28 NOTE — Progress Notes (Signed)
FOLLOW UP  Assessment and Plan:   Hypertension Well controlled at this time off of medications Monitor blood pressure at home; patient to call if consistently greater than 130/80 Continue DASH diet.   Reminder to go to the ER if any CP, SOB, nausea, dizziness, severe HA, changes vision/speech, left arm numbness and tingling and jaw pain.  Cholesterol Currently above goal; working on lifestyle, newly on rosuvastatin 5 mg daily  Continue low cholesterol diet and exercise.  Check lipid panel.   Other abnormal glucose Recent A1Cs at goal Discussed diet/exercise, weight management  Defer A1C; check CMP  Overweight with co morbidities Long discussion about weight loss, diet, and exercise Recommended diet heavy in fruits and veggies and low in animal meats, cheeses, and dairy products, appropriate calorie intake Discussed ideal weight for height  Patient has done well with phentermine and lifestyle  Will follow up in 3 months  Vitamin D Def continue supplementation to maintain goal of 60-100 Check Vit D level at CPE  Depression/anxiety Lexapro 10 mg and wellbutrin 150 mg and doing well  Lifestyle discussed: diet/exerise, sleep hygiene, stress management, hydration  Psoriatic arthritis Continue follow up with Dr. Amil Amen Continue current medications Monitor closely  Estropia/diplopia No improvement off of wellbutrin; following with [facility]  Stable/improving; monitor  Fatigue Post covid 19; slowly improving Monitor; denies other concerning sx  B12 def - recheck levels and supplement as indicated  Need for influenza vaccine High dose quadrivalent administered without complication today    Continue diet and meds as discussed. Further disposition pending results of labs. Discussed med's effects and SE's.   Over 30 minutes of exam, counseling, chart review, and critical decision making was performed.   Future Appointments  Date Time Provider [place]  08/17/2021   9:00 AM Liane Comber, NP [facility] None    ----------------------------------------------------------------------------------------------------------------------  HPI 65 y.o. female  presents for 6 month follow up on hypertension, cholesterol, glucose management, weight and vitamin D deficiency.   She had covid 19 in Sept 2022, presented to ED in distress and was prescribed molnupiravir, reports has recovered but does endorse some residual fatigue that is resolving.    She follows with Dr. Amil Amen for psoriatic arthritis, on MTX and stelara, deferring retirement due to drug costs. She also follows with Dr. Sherley Bounds with [facility] for her neck/back and Dr. Mardelle Matte.   She presented in fall 2020 reporting persistent blurry vision, diplopia, had seen Dr. Katy Fitch, had negative MRI, MS labs;  she was referred to neurology, and again to neuro/opth specialist at [facility] who felt high dose wellbutrin may have contributed, discontinued this but without significant improvement. Has ongoing double vision, has dry eye and watery discharge, improving with steroids, following with Dr. Langley Adie with [place]. No changes and monitoring at this time.   She reports mom's dementia is advancing, work has been very stressful but managing well. On low dose lexapro and wellbutrin for mood with good results, has been working 80+ hours, has put in notice to retire in April.   BMI is Body mass index is 25.99 kg/m., she is working on diet and exercise, weight was up prior to getting covid 19, reports down 6 lb, tracking steps, kayaking, has been working to get back down, personal goal of 155 lb or so, would like to get .  Wt Readings from Last 3 Encounters:  08/02/21 161 lb (73 kg)  06/20/21 150 lb (68 kg)  01/26/21 167 lb (75.8 kg)   Aortic atherosclerosis  incidentally noted on MRI by ortho Dr. Mardelle Matte, 2021, reported by patient. MRI has been requested but never received.   Her blood pressure has been  controlled at home, today their BP is BP: 104/64  She does workout. She denies chest pain, shortness of breath, dizziness.   She is on cholesterol medication. Her cholesterol is not at goal, newly on rosuvastatin 5 mg daily. The cholesterol last visit was:   Lab Results  Component Value Date   CHOL 202 (H) 01/26/2021   HDL 50 01/26/2021   LDLCALC 128 (H) 01/26/2021   TRIG 129 01/26/2021   CHOLHDL 4.0 01/26/2021    She has not been working on diet and exercise for glucose management, and denies increased appetite, nausea, paresthesia of the feet, polydipsia, polyuria and visual disturbances. Last A1C in the office was:  Lab Results  Component Value Date   HGBA1C 5.6 08/16/2020   Patient is on Vitamin D supplement, at goal at last check  Lab Results  Component Value Date   VD25OH 64 08/16/2020     She is not currently taking a b12 supplement  Lab Results  Component Value Date   VITAMINB12 228 08/16/2020      Current Medications:  Current Outpatient Medications on File Prior to Visit  Medication Sig   acetaminophen (TYLENOL) 500 MG tablet Take by mouth every 6 (six) hours as needed.   benzonatate (TESSALON) 200 MG capsule Take 1 cap three times a day as needed for cough.   buPROPion (WELLBUTRIN XL) 150 MG 24 hr tablet TAKE 1 TABLET BY MOUTH EVERY DAY IN THE MORNING   escitalopram (LEXAPRO) 10 MG tablet TAKE 1 TABLET BY MOUTH DAILY FOR MOOD   famotidine (PEPCID) 20 MG tablet Take 1 tablet (20 mg total) by mouth 2 (two) times daily before a meal. For reflux.   folic acid (FOLVITE) 1 MG tablet Take 1 mg by mouth daily.   methotrexate (RHEUMATREX) 10 MG tablet Take 10 mg by mouth. Caution: Chemotherapy. Protect from light. Takes 12 tablets a week.   phentermine (ADIPEX-P) 37.5 MG tablet Take 1/2 to 1 tablet every morning as needed for dieting & weight loss   PROCTOSOL HC 2.5 % rectal cream PLACE 1 APPLICATION RECTALLY 2 (TWO) TIMES DAILY.   promethazine-dextromethorphan  (PROMETHAZINE-DM) 6.25-15 MG/5ML syrup Take 5 mLs by mouth 4 (four) times daily as needed for cough.   rosuvastatin (CRESTOR) 5 MG tablet Take  1 tablet  Daily  for Cholesterol   ustekinumab (STELARA) 45 MG/0.5ML SOSY injection Inject 45 mg into the skin. Injects once every quarter   Vitamin D, Ergocalciferol, (DRISDOL) 1.25 MG (50000 UNIT) CAPS capsule TAKE 1 CAPSULE BY MOUTH 3X PER WEEK FOR VIT D DEFICIENCEY   No current facility-administered medications on file prior to visit.     Allergies: No Known Allergies   Medical History:  Past Medical History:  Diagnosis Date   Allergy    Left thyroid nodule 03/31/2013   Benign per thyroid biopsy in 2014   Mass of chest wall 12/2012   tender to the touch   Osteoarthritis    Psoriatic arthritis ([place])    Seasonal allergies    Family history- Reviewed and unchanged Social history- Reviewed and unchanged   Review of Systems:  Review of Systems  Constitutional:  Negative for malaise/fatigue and weight loss.  HENT:  Negative for hearing loss and tinnitus.   Eyes:  Positive for blurred vision, double vision and redness.  Dry eyes  Respiratory:  Negative for cough, shortness of breath and wheezing.   Cardiovascular:  Negative for chest pain, palpitations, orthopnea, claudication and leg swelling.  Gastrointestinal:  Negative for abdominal pain, blood in stool, constipation, diarrhea, heartburn, melena, nausea and vomiting.  Genitourinary:  Negative for dysuria, flank pain, frequency, hematuria and urgency.       Urinary leakage, stress incontinence, known bladder prolapse per patient   Musculoskeletal:  Negative for joint pain and myalgias.  Skin:  Negative for rash.  Neurological:  Positive for focal weakness (eyes). Negative for dizziness, tingling, sensory change, weakness and headaches.  Endo/Heme/Allergies:  Negative for polydipsia.  Psychiatric/Behavioral:  Negative for depression (improved), hallucinations, substance abuse and  suicidal ideas. The patient is not nervous/anxious and does not have insomnia.   All other systems reviewed and are negative.  Physical Exam: BP 104/64   Pulse 63   Temp (!) 97.3 F (36.3 C)   Wt 161 lb (73 kg)   SpO2 99%   BMI 25.99 kg/m  Wt Readings from Last 3 Encounters:  08/02/21 161 lb (73 kg)  06/20/21 150 lb (68 kg)  01/26/21 167 lb (75.8 kg)   General Appearance: Well nourished, in no apparent distress. Eyes: PERRLA, EOMs abn (see neuro), conjunctiva appear mildly erythematous/irritated without discharge Sinuses: No Frontal/maxillary tenderness ENT/Mouth: Ext aud canals clear, TMs without erythema, bulging. No erythema, swelling, or exudate on post pharynx.  Tonsils not swollen or erythematous. Hearing normal.  Neck: Supple, thyroid normal.  Respiratory: Respiratory effort normal, BS equal bilaterally without rales, rhonchi, wheezing or stridor.  Cardio: RRR with no MRGs. Brisk peripheral pulses without edema.  Abdomen: Soft, + BS.  Non tender, no guarding, rebound, hernias, masses. Lymphatics: Non tender without lymphadenopathy.  Musculoskeletal: Full ROM, 5/5 strength, Normal gait Skin: Warm, dry without rashes, lesions, ecchymosis.  Neuro: CN intact excepting bil interior oblique overreaction with strabismus R>L; reflexes equal bilaterally. Normal muscle tone, no cerebellar symptoms. Sensation intact.  Psych: Awake and oriented X 3, normal affect, Insight and Judgment appropriate.    Izora Ribas, NP 8:38 AM [facility]

## 2021-07-28 NOTE — Progress Notes (Deleted)
FOLLOW UP  Assessment and Plan:   Hypertension Well controlled at this time off of medications Monitor blood pressure at home; patient to call if consistently greater than 130/80 Continue DASH diet.   Reminder to go to the ER if any CP, SOB, nausea, dizziness, severe HA, changes vision/speech, left arm numbness and tingling and jaw pain.  Cholesterol Currently above goal; working on lifestyle, newly on rosuvastatin 5 mg daily  Continue low cholesterol diet and exercise.  Check lipid panel.   Other abnormal glucose Recent A1Cs at goal Discussed diet/exercise, weight management  Defer A1C; check CMP  Overweight with co morbidities Long discussion about weight loss, diet, and exercise Recommended diet heavy in fruits and veggies and low in animal meats, cheeses, and dairy products, appropriate calorie intake Discussed ideal weight for height  Patient has done well with phentermine in the past, requests to restart low dose; PDMP reviewed and sent in;  Will follow up in 3 months  Vitamin D Def Below goal at last visit; has increased dose continue supplementation to maintain goal of 70-100 Check Vit D level  Depression/anxiety Lexapro 10 mg and wellbutrin 150 mg and doing well  Lifestyle discussed: diet/exerise, sleep hygiene, stress management, hydration  Psoriatic arthritis Continue follow up with Dr. Amil Amen Continue current medications Monitor closely  Estropia/diplopia No improvement off of wellbutrin; following with [facility]  Stable/improving; monitor   Continue diet and meds as discussed. Further disposition pending results of labs. Discussed med's effects and SE's.   Over 30 minutes of exam, counseling, chart review, and critical decision making was performed.   Future Appointments  Date Time Provider Benjamin  07/29/2021  9:30 AM Liane Comber, NP [facility] None  08/17/2021  9:00 AM Liane Comber, NP [facility] None     ----------------------------------------------------------------------------------------------------------------------  HPI 65 y.o. female  presents for 6 month follow up on hypertension, cholesterol, glucose management, weight and vitamin D deficiency.   She follows with Dr. Amil Amen for psoriatic arthritis, on MTX and stelara, deferring retirement due to drug costs. She also follows with Dr. Sherley Bounds with [facility] for her neck/back, and Dr. Mardelle Matte for her shoulder, just had L replaced Feb 18, 2020 and has done very well.   She was on wellbutrin 300 mg daily for major depression; She presented in fall 2020 reporting persistent blurry vision, diplopia, had seen Dr. Katy Fitch who ordered MRI which was unremarkable, concern for MG though labs were negative; she was referred to neurology, and again to [facility]/opth specialist at [facility] who felt high dose wellbutrin may have contributed, discontinued this but without significant improvement. Has ongoing double vision, has dry eye and watery discharge, improving with steroids, discussing possible surgical correction for eye lid weakness, seeing a new provider in [place], - Dr. Langley Adie with [facility]. No changes and monitoring at this time  She reports mom's dementia is advancing, work has been very stressful but managing well. On low dose lexapro and wellbutrin for mood with good results, has been working 80+ hours, finally put in notice to leave work yesterday.   BMI is There is no height or weight on file to calculate BMI., she is working on diet and exercise, reports at home was up to 177 lb, tracking steps, has been working to get back down, personal goal of 135 lb or so.  Wt Readings from Last 3 Encounters:  06/20/21 150 lb (68 kg)  01/26/21 167 lb (75.8 kg)  08/16/20 164 lb 6.4 oz (74.6 kg)  Aortic atherosclerosis incidentally noted on MRI by ortho Dr. Mardelle Matte, 2021, reported by patient. MRI has been requested but never received.   Her blood pressure has been controlled at home, today their BP is    She does not workout. She denies chest pain, shortness of breath, dizziness.   She is on cholesterol medication. Her cholesterol is not at goal, newly on rosuvastatin 5 mg daily. The cholesterol last visit was:   Lab Results  Component Value Date   CHOL 202 (H) 01/26/2021   HDL 50 01/26/2021   LDLCALC 128 (H) 01/26/2021   TRIG 129 01/26/2021   CHOLHDL 4.0 01/26/2021    She has not been working on diet and exercise for glucose management, and denies increased appetite, nausea, paresthesia of the feet, polydipsia, polyuria and visual disturbances. Last A1C in the office was:  Lab Results  Component Value Date   HGBA1C 5.6 08/16/2020   Patient is on Vitamin D supplement, at goal at last check  Lab Results  Component Value Date   VD25OH 64 08/16/2020     *** Lab Results  Component Value Date   VITAMINB12 228 08/16/2020      Current Medications:  Current Outpatient Medications on File Prior to Visit  Medication Sig   acetaminophen (TYLENOL) 500 MG tablet Take by mouth every 6 (six) hours as needed.   benzonatate (TESSALON) 200 MG capsule Take 1 cap three times a day as needed for cough.   buPROPion (WELLBUTRIN XL) 150 MG 24 hr tablet TAKE 1 TABLET BY MOUTH EVERY DAY IN THE MORNING   escitalopram (LEXAPRO) 10 MG tablet TAKE 1 TABLET BY MOUTH DAILY FOR MOOD   famotidine (PEPCID) 20 MG tablet Take 1 tablet (20 mg total) by mouth 2 (two) times daily before a meal. For reflux.   folic acid (FOLVITE) 1 MG tablet Take 1 mg by mouth daily.   methotrexate (RHEUMATREX) 10 MG tablet Take 10 mg by mouth. Caution: Chemotherapy. Protect from light. Takes 12 tablets a week.   phentermine (ADIPEX-P) 37.5 MG tablet Take 1/2 to 1 tablet every morning as needed for dieting & weight loss   PROCTOSOL HC 2.5 % rectal cream PLACE 1 APPLICATION RECTALLY 2 (TWO) TIMES DAILY.   promethazine-dextromethorphan (PROMETHAZINE-DM) 6.25-15  MG/5ML syrup Take 5 mLs by mouth 4 (four) times daily as needed for cough.   rosuvastatin (CRESTOR) 5 MG tablet Take  1 tablet  Daily  for Cholesterol   ustekinumab (STELARA) 45 MG/0.5ML SOSY injection Inject 45 mg into the skin. Injects once every quarter   Vitamin D, Ergocalciferol, (DRISDOL) 1.25 MG (50000 UNIT) CAPS capsule TAKE 1 CAPSULE BY MOUTH 3X PER WEEK FOR VIT D DEFICIENCEY   No current facility-administered medications on file prior to visit.     Allergies: No Known Allergies   Medical History:  Past Medical History:  Diagnosis Date   Allergy    Left thyroid nodule 03/31/2013   Benign per thyroid biopsy in 2014   Mass of chest wall 12/2012   tender to the touch   Osteoarthritis    Psoriatic arthritis ([place])    Seasonal allergies    Family history- Reviewed and unchanged Social history- Reviewed and unchanged   Review of Systems:  Review of Systems  Constitutional:  Negative for malaise/fatigue and weight loss.  HENT:  Negative for hearing loss and tinnitus.   Eyes:  Positive for blurred vision, double vision and redness.       Dry eyes  Respiratory:  Negative  for cough, shortness of breath and wheezing.   Cardiovascular:  Negative for chest pain, palpitations, orthopnea, claudication and leg swelling.  Gastrointestinal:  Negative for abdominal pain, blood in stool, constipation, diarrhea, heartburn, melena, nausea and vomiting.  Genitourinary:  Negative for dysuria, flank pain, frequency, hematuria and urgency.       Urinary leakage, stress incontinence, known bladder prolapse per patient   Musculoskeletal:  Negative for joint pain and myalgias.  Skin:  Negative for rash.  Neurological:  Positive for focal weakness (eyes). Negative for dizziness, tingling, sensory change, weakness and headaches.  Endo/Heme/Allergies:  Negative for polydipsia.  Psychiatric/Behavioral:  Negative for depression (improved), hallucinations, substance abuse and suicidal ideas. The  patient is not nervous/anxious and does not have insomnia.   All other systems reviewed and are negative.  Physical Exam: There were no vitals taken for this visit. Wt Readings from Last 3 Encounters:  06/20/21 150 lb (68 kg)  01/26/21 167 lb (75.8 kg)  08/16/20 164 lb 6.4 oz (74.6 kg)   General Appearance: Well nourished, in no apparent distress. Eyes: PERRLA, EOMs abn (see neuro), conjunctiva appear mildly erythematous/irritated without discharge Sinuses: No Frontal/maxillary tenderness ENT/Mouth: Ext aud canals clear, TMs without erythema, bulging. No erythema, swelling, or exudate on post pharynx.  Tonsils not swollen or erythematous. Hearing normal.  Neck: Supple, thyroid normal.  Respiratory: Respiratory effort normal, BS equal bilaterally without rales, rhonchi, wheezing or stridor.  Cardio: RRR with no MRGs. Brisk peripheral pulses without edema.  Abdomen: Soft, + BS.  Non tender, no guarding, rebound, hernias, masses. Lymphatics: Non tender without lymphadenopathy.  Musculoskeletal: Full ROM, 5/5 strength, Normal gait Skin: Warm, dry without rashes, lesions, ecchymosis.  Neuro: CN intact excepting bil interior oblique overreaction with strabismus R>L; reflexes equal bilaterally. Normal muscle tone, no cerebellar symptoms. Sensation intact.  Psych: Awake and oriented X 3, normal affect, Insight and Judgment appropriate.    Izora Ribas, NP 8:58 AM [facility]

## 2021-07-29 ENCOUNTER — Ambulatory Visit: Payer: BC Managed Care – PPO | Admitting: Adult Health

## 2021-08-02 ENCOUNTER — Ambulatory Visit (INDEPENDENT_AMBULATORY_CARE_PROVIDER_SITE_OTHER): Payer: BC Managed Care – PPO | Admitting: Adult Health

## 2021-08-02 ENCOUNTER — Encounter: Payer: Self-pay | Admitting: Adult Health

## 2021-08-02 ENCOUNTER — Other Ambulatory Visit: Payer: Self-pay

## 2021-08-02 VITALS — BP 104/64 | HR 63 | Temp 97.3°F | Wt 161.0 lb

## 2021-08-02 DIAGNOSIS — Z23 Encounter for immunization: Secondary | ICD-10-CM

## 2021-08-02 DIAGNOSIS — I700 Atherosclerosis of aorta: Secondary | ICD-10-CM | POA: Diagnosis not present

## 2021-08-02 DIAGNOSIS — I10 Essential (primary) hypertension: Secondary | ICD-10-CM

## 2021-08-02 DIAGNOSIS — E785 Hyperlipidemia, unspecified: Secondary | ICD-10-CM | POA: Diagnosis not present

## 2021-08-02 DIAGNOSIS — E559 Vitamin D deficiency, unspecified: Secondary | ICD-10-CM

## 2021-08-02 DIAGNOSIS — L4050 Arthropathic psoriasis, unspecified: Secondary | ICD-10-CM

## 2021-08-02 DIAGNOSIS — R7309 Other abnormal glucose: Secondary | ICD-10-CM

## 2021-08-02 DIAGNOSIS — Z79899 Other long term (current) drug therapy: Secondary | ICD-10-CM

## 2021-08-02 DIAGNOSIS — H5000 Unspecified esotropia: Secondary | ICD-10-CM | POA: Diagnosis not present

## 2021-08-02 DIAGNOSIS — E538 Deficiency of other specified B group vitamins: Secondary | ICD-10-CM | POA: Diagnosis not present

## 2021-08-02 DIAGNOSIS — F3341 Major depressive disorder, recurrent, in partial remission: Secondary | ICD-10-CM

## 2021-08-02 DIAGNOSIS — E663 Overweight: Secondary | ICD-10-CM

## 2021-08-02 NOTE — Addendum Note (Signed)
Addended by: Chancy Hurter on: 08/02/2021 09:14 AM   Modules accepted: Orders

## 2021-08-03 ENCOUNTER — Other Ambulatory Visit: Payer: Self-pay | Admitting: Adult Health

## 2021-08-03 LAB — CBC WITH DIFFERENTIAL/PLATELET
Absolute Monocytes: 445 cells/uL (ref 200–950)
Basophils Absolute: 51 cells/uL (ref 0–200)
Basophils Relative: 0.9 %
Eosinophils Absolute: 120 cells/uL (ref 15–500)
Eosinophils Relative: 2.1 %
HCT: 39.8 % (ref 35.0–45.0)
Hemoglobin: 13.4 g/dL (ref 11.7–15.5)
Lymphs Abs: 1778 cells/uL (ref 850–3900)
MCH: 31 pg (ref 27.0–33.0)
MCHC: 33.7 g/dL (ref 32.0–36.0)
MCV: 92.1 fL (ref 80.0–100.0)
MPV: 10.9 fL (ref 7.5–12.5)
Monocytes Relative: 7.8 %
Neutro Abs: 3306 cells/uL (ref 1500–7800)
Neutrophils Relative %: 58 %
Platelets: 297 10*3/uL (ref 140–400)
RBC: 4.32 10*6/uL (ref 3.80–5.10)
RDW: 12.4 % (ref 11.0–15.0)
Total Lymphocyte: 31.2 %
WBC: 5.7 10*3/uL (ref 3.8–10.8)

## 2021-08-03 LAB — COMPLETE METABOLIC PANEL WITH GFR
AG Ratio: 1.9 (calc) (ref 1.0–2.5)
ALT: 15 U/L (ref 6–29)
AST: 15 U/L (ref 10–35)
Albumin: 4.4 g/dL (ref 3.6–5.1)
Alkaline phosphatase (APISO): 65 U/L (ref 37–153)
BUN: 16 mg/dL (ref 7–25)
CO2: 24 mmol/L (ref 20–32)
Calcium: 9.5 mg/dL (ref 8.6–10.4)
Chloride: 106 mmol/L (ref 98–110)
Creat: 0.74 mg/dL (ref 0.50–1.05)
Globulin: 2.3 g/dL (calc) (ref 1.9–3.7)
Glucose, Bld: 105 mg/dL — ABNORMAL HIGH (ref 65–99)
Potassium: 4.7 mmol/L (ref 3.5–5.3)
Sodium: 140 mmol/L (ref 135–146)
Total Bilirubin: 0.4 mg/dL (ref 0.2–1.2)
Total Protein: 6.7 g/dL (ref 6.1–8.1)
eGFR: 89 mL/min/{1.73_m2} (ref >=60)

## 2021-08-03 LAB — LIPID PANEL
Cholesterol: 216 mg/dL — ABNORMAL HIGH (ref <200)
HDL: 55 mg/dL (ref >=50)
LDL Cholesterol (Calc): 136 mg/dL (calc) — ABNORMAL HIGH
Non-HDL Cholesterol (Calc): 161 mg/dL (calc) — ABNORMAL HIGH (ref <130)
Total CHOL/HDL Ratio: 3.9 (calc) (ref <5.0)
Triglycerides: 125 mg/dL (ref <150)

## 2021-08-03 LAB — VITAMIN B12: Vitamin B-12: 209 pg/mL (ref 200–1100)

## 2021-08-03 LAB — TSH: TSH: 1.27 mIU/L (ref 0.40–4.50)

## 2021-08-03 MED ORDER — ROSUVASTATIN CALCIUM 10 MG PO TABS: ORAL_TABLET | ORAL | 3 refills | Status: DC

## 2021-08-10 ENCOUNTER — Encounter: Payer: Managed Care, Other (non HMO) | Admitting: Adult Health Nurse Practitioner

## 2021-08-17 ENCOUNTER — Encounter: Payer: Managed Care, Other (non HMO) | Admitting: Adult Health

## 2021-12-30 ENCOUNTER — Encounter: Payer: No Typology Code available for payment source | Admitting: Adult Health

## 2022-01-27 ENCOUNTER — Encounter: Payer: No Typology Code available for payment source | Admitting: Nurse Practitioner

## 2022-02-21 ENCOUNTER — Encounter: Payer: No Typology Code available for payment source | Admitting: Nurse Practitioner

## 2022-02-24 ENCOUNTER — Other Ambulatory Visit: Payer: Self-pay | Admitting: Adult Health

## 2022-05-03 ENCOUNTER — Ambulatory Visit (INDEPENDENT_AMBULATORY_CARE_PROVIDER_SITE_OTHER): Payer: No Typology Code available for payment source | Admitting: Nurse Practitioner

## 2022-05-03 ENCOUNTER — Encounter: Payer: Self-pay | Admitting: Nurse Practitioner

## 2022-05-03 VITALS — BP 130/93 | HR 72 | Temp 96.8°F | Ht 66.0 in | Wt 166.4 lb

## 2022-05-03 DIAGNOSIS — H532 Diplopia: Secondary | ICD-10-CM

## 2022-05-03 DIAGNOSIS — L4050 Arthropathic psoriasis, unspecified: Secondary | ICD-10-CM

## 2022-05-03 DIAGNOSIS — T7840XS Allergy, unspecified, sequela: Secondary | ICD-10-CM

## 2022-05-03 DIAGNOSIS — Z0000 Encounter for general adult medical examination without abnormal findings: Secondary | ICD-10-CM | POA: Diagnosis not present

## 2022-05-03 DIAGNOSIS — E559 Vitamin D deficiency, unspecified: Secondary | ICD-10-CM

## 2022-05-03 DIAGNOSIS — Z0001 Encounter for general adult medical examination with abnormal findings: Secondary | ICD-10-CM

## 2022-05-03 DIAGNOSIS — Z136 Encounter for screening for cardiovascular disorders: Secondary | ICD-10-CM

## 2022-05-03 DIAGNOSIS — I10 Essential (primary) hypertension: Secondary | ICD-10-CM | POA: Diagnosis not present

## 2022-05-03 DIAGNOSIS — F3341 Major depressive disorder, recurrent, in partial remission: Secondary | ICD-10-CM

## 2022-05-03 DIAGNOSIS — R7309 Other abnormal glucose: Secondary | ICD-10-CM

## 2022-05-03 DIAGNOSIS — E538 Deficiency of other specified B group vitamins: Secondary | ICD-10-CM

## 2022-05-03 DIAGNOSIS — E663 Overweight: Secondary | ICD-10-CM

## 2022-05-03 DIAGNOSIS — K219 Gastro-esophageal reflux disease without esophagitis: Secondary | ICD-10-CM

## 2022-05-03 DIAGNOSIS — Z1389 Encounter for screening for other disorder: Secondary | ICD-10-CM

## 2022-05-03 DIAGNOSIS — E785 Hyperlipidemia, unspecified: Secondary | ICD-10-CM

## 2022-05-03 DIAGNOSIS — Z79899 Other long term (current) drug therapy: Secondary | ICD-10-CM

## 2022-05-03 DIAGNOSIS — K649 Unspecified hemorrhoids: Secondary | ICD-10-CM

## 2022-05-03 MED ORDER — PHENTERMINE HCL 37.5 MG PO TABS: ORAL_TABLET | ORAL | 2 refills | Status: DC

## 2022-05-03 NOTE — Progress Notes (Signed)
Complete Physical  Assessment and Plan:  Diagnoses and all orders for this visit:  Encounter for general adult medical examination with abnormal findings Due Annually   Hemorrhoids, unspecified hemorrhoid type No recent flare. Controlled Continue anusol PRN Follow up with GI for worsening symptoms  Essential hypertension Controlled  Continue medications;  Discussed DASH (Dietary Approaches to Stop Hypertension) DASH diet is lower in sodium than a typical American diet. Cut back on foods that are high in saturated fat, cholesterol, and trans fats. Eat more whole-grain foods, fish, poultry, and nuts Remain active and exercise as tolerated daily.  Monitor BP at home-Call if greater than 130/80.  Check CBC/CMP  Psoriatic arthritis ([place]) Continue DMARD No longer taking Stelara Follows with Dr. Amil Amen  She has f/u in 1 mo  Vitamin D deficiency Continue supplementation Check vitamin D level  Medication management All medications discussed and reviewed in full. All questions and concerns regarding medications addressed.     Hyperlipidemia, unspecified hyperlipidemia type Controlled Continue medications; Rosuvastatin Discussed lifestyle modifications. Recommended diet heavy in fruits and veggies, omega 3's. Decrease consumption of animal meats, cheeses, and dairy products. Remain active and exercise as tolerated. Continue to monitor. Check Lipids   Allergic state, sequela Avoid triggers. Continue OTC allergy pills  Screening for hematuria or proteinuria Check UA, microalbumin  B12 deficiency Continue B12 supplement Check levels  Overweight (BMI 25.0-29.9) Discussed appropriate BMI Goal of losing 1 lb per month. Diet modification. Physical activity. Encouraged/praised to build confidence. Start low dose Phentermine   Other abnormal glucose Education: Reviewed 'ABCs' of diabetes management  A1C (<7) Blood pressure (<130/80) Cholesterol (LDL  <70) Continue Eye Exam yearly  Continue Dental Exam Q6 mo Discussed dietary recommendations Discussed Physical Activity recommendations Check A1C   Recurrent major depressive disorder, in partial remission (HCC) Continue Lexapro Lifestyle discussed: diet/exerise, sleep hygiene, stress management, hydration  Diplopia/esotrophia with inferior oblique overaction Significant EOM abnormalities, MRI by opth was unremarkable;  Following with [facility] neuro/ophth -  Continue to follow with [facility]   Fatigue/weight gain/ hair loss Continue to monitor thyroid and B12 Discussed possibly covid related as well. Continue to monitor  GERD Continue Pepcid No suspected reflux complications ([place]/stricture). Lifestyle modification:  wt loss, avoid meals 2-3h before bedtime. Consider eliminating food triggers:  chocolate, caffeine, EtOH, acid/spicy food.   Orders Placed This Encounter  Procedures   CBC with Differential/Platelet   COMPLETE METABOLIC PANEL WITH GFR   Lipid panel   TSH   VITAMIN D 25 Hydroxy (Vit-D Deficiency, Fractures)   Hemoglobin A1c   Urinalysis, Routine w reflex microscopic   Meds ordered this encounter  Medications   phentermine (ADIPEX-P) 37.5 MG tablet    Sig: Take 1/2 to 1 tablet every morning as needed for dieting & weight loss    Dispense:  30 tablet    Refill:  2    Order Specific Question:   Supervising Provider    Answer:   Unk Pinto [phone]    Discussed med's effects and SE's. Screening labs and tests as requested with regular follow-up as recommended.  Over 30 minutes of exam, counseling, chart review, and complex, high level critical decision making was performed this visit.   Future Appointments  Date Time Provider [place]  05/04/2023  9:00 AM Darrol Jump, NP [facility] None     HPI  66 y.o. female  presents for a complete physical and follow up for has Hemorrhoids; Allergy; Other abnormal glucose; Hyperlipidemia; Psoriatic  arthritis ([place]); Essential hypertension; Vitamin D  deficiency; Medication management; Recurrent major depressive disorder, in partial remission ([place]); Cervical arthritis; Overweight (BMI 25.0-29.9); Vertical diplopia; Binocular vision disorder with diplopia; Esotropia with inferior oblique overaction; Accommodative convergent strabismus; Osteoporosis; Atherosclerosis of aortic arch (HCC); and B12 deficiency on their problem list.   She is divorced, 2 kids, 1 passed due to pneumonia. Son is well, 1 grandchild, 59 y/o, has cochlear implants and doing well.  She has steady boyfriend of many years whom she lives with.   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 She is trying to retire soon. Very stressful job.  She also follows with Dr. Sherley Bounds with [facility] for her back, and Dr. Mardelle Matte for her shoulder and hips, discussing possible bil hip replacements. Not ready at this time.  She would like to work on weight loss.  Has been on Phentermine in the past with some decrease in weight. Has not taken since 09/2021.  She follows with Dr. Amil Amen for psoriatic arthritis, on MTX.Stelara was recently stopped. Deferring retirement due to drug costs. She also follows with Dr. Sherley Bounds with [facility] for her neck/back, and Dr. Mardelle Matte for her shoulder, just had L replaced Feb 18, 2020 and has done very well.   She was on wellbutrin 300 mg daily for major depression;  She is no longer taking this medication d/t diplopia. She presented in fall 2020 reporting persistent blurry vision, diplopia, had seen Dr. Katy Fitch who ordered MRI which was unremarkable, concern for MG though labs were negative; she was referred to neurology, and again to neuro/opth specialist at [facility] who felt high dose wellbutrin may have contributed, discontinued this but without significant improvement. Has ongoing double vision, has dry eye and watery discharge, improving with steroids,  discussing possible surgical correction for eye lid weakness, will be seeing a new provider in [place] later this year - Dr. Langley Adie with [facility].     BMI is Body mass index is 26.86 kg/m., she is working on diet and exercise, was down to 148 lb prior to covid 19 but has gained back due to admitted excessive snacking while at work. She does walk on work breaks, getting in average of 3 miles of walking daily.  Wt Readings from Last 3 Encounters:  05/03/22 166 lb 6.4 oz (75.5 kg)  08/02/21 161 lb (73 kg)  06/20/21 150 lb (68 kg)   Her blood pressure has been controlled at home, today their BP is BP: (!) 130/93 She does not workout. She denies chest pain, shortness of breath, dizziness.   She is on cholesterol medication rosuvastatin 5 mg daily and denies myalgias. Her cholesterol is not at goal. The cholesterol last visit was:   Lab Results  Component Value Date   CHOL 216 (H) 08/02/2021   HDL 55 08/02/2021   LDLCALC 136 (H) 08/02/2021   TRIG 125 08/02/2021   CHOLHDL 3.9 08/02/2021    Last A1C in the office was:  Lab Results  Component Value Date   HGBA1C 5.6 08/16/2020    She has hx of left thyroid nodule; underwent US and biopsy in 2014 which showed benign results. TSHs have been stable.  This year has had thinning hair, very dry skin, weight gain, fatigue; She discussed with Dr. Amil Amen and requests full thyroid panel, T3, T4 checked today  Lab Results  Component Value Date   TSH 1.27 08/02/2021   Last GFR: Lab Results  Component Value Date   [facility] 87 01/26/2021  Patient is on Vitamin D supplement, taking 50000 3-4 days a week  Lab Results  Component Value Date   VD25OH 40 08/16/2020     She has hx of low B12, not on supplement, does want levels checked today Lab Results  Component Value Date   VITAMINB12 209 08/02/2021     Current Medications:  Current Outpatient Medications on File Prior to Visit  Medication Sig Dispense Refill   acetaminophen (TYLENOL)  500 MG tablet Take by mouth every 6 (six) hours as needed.     buPROPion (WELLBUTRIN XL) 150 MG 24 hr tablet TAKE 1 TABLET BY MOUTH EVERY DAY IN THE MORNING 90 tablet 1   escitalopram (LEXAPRO) 10 MG tablet TAKE 1 TABLET BY MOUTH DAILY FOR MOOD 90 tablet 3   folic acid (FOLVITE) 1 MG tablet Take 1 mg by mouth daily.     methotrexate (RHEUMATREX) 10 MG tablet Take 10 mg by mouth. Caution: Chemotherapy. Protect from light. Takes 12 tablets a week.     phentermine (ADIPEX-P) 37.5 MG tablet Take 1/2 to 1 tablet every morning as needed for dieting & weight loss 30 tablet 2   PROCTOSOL HC 2.5 % rectal cream PLACE 1 APPLICATION RECTALLY 2 (TWO) TIMES DAILY. 28.35 g 1   promethazine-dextromethorphan (PROMETHAZINE-DM) 6.25-15 MG/5ML syrup Take 5 mLs by mouth 4 (four) times daily as needed for cough. 240 mL 1   rosuvastatin (CRESTOR) 10 MG tablet Take  1 tablet  Daily  for Cholesterol 90 tablet 3   Vitamin D, Ergocalciferol, (DRISDOL) 1.25 MG (50000 UNIT) CAPS capsule TAKE 1 CAPSULE BY MOUTH 3X PER WEEK FOR VIT D DEFICIENCEY 39 capsule 1   benzonatate (TESSALON) 200 MG capsule Take 1 cap three times a day as needed for cough. 30 capsule 1   famotidine (PEPCID) 20 MG tablet Take 1 tablet (20 mg total) by mouth 2 (two) times daily before a meal. For reflux. 180 tablet 1   ustekinumab (STELARA) 45 MG/0.5ML SOSY injection Inject 45 mg into the skin. Injects once every quarter     No current facility-administered medications on file prior to visit.   Allergies:  No Known Allergies Medical History:  She has Hemorrhoids; Allergy; Other abnormal glucose; Hyperlipidemia; Psoriatic arthritis ([place]); Essential hypertension; Vitamin D deficiency; Medication management; Recurrent major depressive disorder, in partial remission ([place]); Cervical arthritis; Overweight (BMI 25.0-29.9); Vertical diplopia; Binocular vision disorder with diplopia; Esotropia with inferior oblique overaction; Accommodative convergent strabismus;  Osteoporosis; Atherosclerosis of aortic arch ([place]); and B12 deficiency on their problem list. Health Maintenance:   Immunization History  Administered Date(s) Administered   Influenza, High Dose Seasonal PF 06/04/2019, 08/16/2020, 08/02/2021   Moderna Sars-Covid-2 Vaccination 11/24/2019, 12/22/2019   PPD Test 05/07/2017   Pneumococcal Conjugate-13 06/04/2019   Pneumococcal Polysaccharide-23 08/16/2020   Tdap 12/09/2015   Tetanus: 2017 Pneumovax: Completed Prevnar 13: 2020 Flu vaccine: 2022,  Shingrix: will check with Dr. Amil Amen Covid 19: 2/2, 2021, moderna  LMP: 20+ years s/p TAH Pap: 2017  neg HPV, never abnormal, DONE MGM: 11/2019 DEXA: 11/2019, L fem T-2.8, fosamax started - taking weekly   Colonoscopy: 2014 due 2024  Thyroid US/biopsy: 2014, benign  Last Dental Exam: q6 months, 2022 Last Eye Exam: [facility] (group with Dr. Charlene Brooke), last visit 2022 Last Derm Exam: Dr. Allyson Sabal, goes annually, last 2020, will schedule  Patient Care Team: Unk Pinto, MD as PCP - General (Internal Medicine) Wonda Horner, MD as Consulting Physician (Gastroenterology) Hennie Duos, MD as Consulting  Physician (Rheumatology) Marchia Bond, MD as Consulting Physician (Orthopedic Surgery) Elberta Spaniel, MD as Referring Physician (Neurology)  Surgical History:  She has a past surgical history that includes Cholecystectomy (1997); Shoulder surgery (03/2012); Cervical fusion (2005); Anterior cervical decomp/discectomy fusion (08/08/2012); Total shoulder arthroplasty (09/24/2012); Eye surgery; Lumbar laminectomy/decompression microdiscectomy (01/01/2013); Lumbar fusion (01/01/2013); Abdominal hysterectomy (1997); Mass excision (N/A, 01/29/2013); Hemorrhoid surgery (N/A, 09/02/2013); Reduction mammaplasty (Bilateral); and Total shoulder arthroplasty (Left, 02/18/2020). Family History:  Herfamily history includes Asthma in her daughter; Breast cancer in her cousin, cousin, and  maternal aunt; CAD (age of onset: 43) in her mother; Cancer in her paternal grandfather; Dementia in her mother; Diabetes in her father; Goiter in her maternal grandmother; Heart disease in her father; Lung cancer (age of onset: 61) in her maternal grandfather; Ovarian cancer (age of onset: 35) in her paternal grandmother; Pancreatitis in her father; Thyroid cancer in her mother; Thyroid disease in her maternal grandmother and another family member. Social History:  She reports that she has never smoked. She has never used smokeless tobacco. She reports that she does not drink alcohol and does not use drugs.   Review of Systems: Review of Systems  Constitutional:  Positive for malaise/fatigue. Negative for weight loss.  HENT:  Negative for hearing loss and tinnitus.   Eyes:  Positive for double vision (intermittent, improving). Negative for blurred vision, photophobia, pain, discharge and redness.  Respiratory:  Negative for cough, sputum production, shortness of breath and wheezing.   Cardiovascular:  Negative for chest pain, palpitations, orthopnea, claudication, leg swelling and PND.  Gastrointestinal:  Positive for heartburn. Negative for abdominal pain, blood in stool, constipation, diarrhea, melena, nausea and vomiting.  Genitourinary: Negative.   Musculoskeletal:  Positive for joint pain. Negative for falls and myalgias.  Skin:  Negative for rash.  Neurological:  Negative for dizziness, tingling, tremors, sensory change, speech change, focal weakness, seizures, loss of consciousness, weakness and headaches.  Endo/Heme/Allergies:  Negative for polydipsia.  Psychiatric/Behavioral: Negative.  Negative for depression, memory loss, substance abuse and suicidal ideas. The patient is not nervous/anxious and does not have insomnia.   All other systems reviewed and are negative.   Physical Exam: Estimated body mass index is 26.86 kg/m as calculated from the following:   Height as of this  encounter: '5\' 6"'$  (1.676 m).   Weight as of this encounter: 166 lb 6.4 oz (75.5 kg). BP (!) 130/93   Pulse 72   Temp (!) 96.8 F (36 C)   Ht '5\' 6"'$  (1.676 m)   Wt 166 lb 6.4 oz (75.5 kg)   SpO2 99%   BMI 26.86 kg/m  General Appearance: Well nourished, in no apparent distress.  Eyes: Pupils symmetrical but with significant difficulty focusing with erratic pupil reactions,EOM abnormal, bil interior oblique overreaction with strabismus R>L, conjunctiva no swelling or erythema  Sinuses: No Frontal/maxillary tenderness  ENT/Mouth: Ext aud canals clear, normal light reflex with TMs without erythema, bulging. Good dentition. No erythema, swelling, or exudate on post pharynx. Tonsils not swollen or erythematous. Hearing normal.  Neck: Supple, thyroid subtly enlarged, L > R, no distinct palpable nodules. No bruits  Respiratory: Respiratory effort normal, BS equal bilaterally without rales, rhonchi, wheezing or stridor.  Cardio: RRR without murmurs, rubs or gallops. Brisk peripheral pulses without edema.  Chest: symmetric, with normal excursions and percussion.  Breasts: Symmetric, without lumps, nipple discharge, retractions. Well healed reduction scars.  Abdomen: Soft, nontender, no guarding, rebound, hernias, masses, or organomegaly.  Lymphatics: Non tender without  lymphadenopathy.  Genitourinary: Defer Musculoskeletal: Full ROM all peripheral extremities, 5/5 strength, and normal gait.  Skin: Warm, dry, without lesions, ecchymosis. psoriatic rash scattered - erythematous, scaly/flaky Neuro: Cranial nerves intact excepting III, IV, VI as noted by EOMs,  reflexes equal bilaterally. Normal muscle tone, no cerebellar symptoms. Sensation intact.  Psych: Awake and oriented X 3, pleasant, normal affect, Insight and Judgment appropriate.   EKG: WNL, no ST changes.  Shiela Bruns 9:08 AM [facility]

## 2022-05-03 NOTE — Patient Instructions (Signed)

## 2022-05-04 LAB — COMPLETE METABOLIC PANEL WITH GFR
AG Ratio: 1.9 (calc) (ref 1.0–2.5)
ALT: 15 U/L (ref 6–29)
AST: 14 U/L (ref 10–35)
Albumin: 4.3 g/dL (ref 3.6–5.1)
Alkaline phosphatase (APISO): 60 U/L (ref 37–153)
BUN: 15 mg/dL (ref 7–25)
CO2: 23 mmol/L (ref 20–32)
Calcium: 9.3 mg/dL (ref 8.6–10.4)
Chloride: 106 mmol/L (ref 98–110)
Creat: 0.83 mg/dL (ref 0.50–1.05)
Globulin: 2.3 g/dL (calc) (ref 1.9–3.7)
Glucose, Bld: 97 mg/dL (ref 65–99)
Potassium: 4.5 mmol/L (ref 3.5–5.3)
Sodium: 139 mmol/L (ref 135–146)
Total Bilirubin: 0.5 mg/dL (ref 0.2–1.2)
Total Protein: 6.6 g/dL (ref 6.1–8.1)
eGFR: 77 mL/min/{1.73_m2} (ref >=60)

## 2022-05-04 LAB — CBC WITH DIFFERENTIAL/PLATELET
Absolute Monocytes: 342 cells/uL (ref 200–950)
Basophils Absolute: 41 cells/uL (ref 0–200)
Basophils Relative: 0.7 %
Eosinophils Absolute: 139 cells/uL (ref 15–500)
Eosinophils Relative: 2.4 %
HCT: 39.4 % (ref 35.0–45.0)
Hemoglobin: 13.4 g/dL (ref 11.7–15.5)
Lymphs Abs: 1757 cells/uL (ref 850–3900)
MCH: 31.6 pg (ref 27.0–33.0)
MCHC: 34 g/dL (ref 32.0–36.0)
MCV: 92.9 fL (ref 80.0–100.0)
MPV: 10.4 fL (ref 7.5–12.5)
Monocytes Relative: 5.9 %
Neutro Abs: 3521 cells/uL (ref 1500–7800)
Neutrophils Relative %: 60.7 %
Platelets: 261 10*3/uL (ref 140–400)
RBC: 4.24 10*6/uL (ref 3.80–5.10)
RDW: 12.7 % (ref 11.0–15.0)
Total Lymphocyte: 30.3 %
WBC: 5.8 10*3/uL (ref 3.8–10.8)

## 2022-05-04 LAB — LIPID PANEL
Cholesterol: 223 mg/dL — ABNORMAL HIGH (ref <200)
HDL: 56 mg/dL (ref >=50)
LDL Cholesterol (Calc): 138 mg/dL (calc) — ABNORMAL HIGH
Non-HDL Cholesterol (Calc): 167 mg/dL (calc) — ABNORMAL HIGH (ref <130)
Total CHOL/HDL Ratio: 4 (calc) (ref <5.0)
Triglycerides: 158 mg/dL — ABNORMAL HIGH (ref <150)

## 2022-05-04 LAB — HEMOGLOBIN A1C
Hgb A1c MFr Bld: 5.6 % of total Hgb (ref <5.7)
Mean Plasma Glucose: 114 mg/dL
eAG (mmol/L): 6.3 mmol/L

## 2022-05-04 LAB — URINALYSIS, ROUTINE W REFLEX MICROSCOPIC
Bacteria, UA: NONE SEEN /HPF
Bilirubin Urine: NEGATIVE
Glucose, UA: NEGATIVE
Hgb urine dipstick: NEGATIVE
Hyaline Cast: NONE SEEN /LPF
Ketones, ur: NEGATIVE
Nitrite: NEGATIVE
Protein, ur: NEGATIVE
Specific Gravity, Urine: 1.037 — ABNORMAL HIGH (ref 1.001–1.035)
pH: <=5 (ref 5.0–8.0)

## 2022-05-04 LAB — TSH: TSH: 1.33 mIU/L (ref 0.40–4.50)

## 2022-05-04 LAB — VITAMIN D 25 HYDROXY (VIT D DEFICIENCY, FRACTURES): Vit D, 25-Hydroxy: 40 ng/mL (ref 30–100)

## 2022-08-09 ENCOUNTER — Ambulatory Visit: Payer: No Typology Code available for payment source | Admitting: Nurse Practitioner

## 2022-08-09 ENCOUNTER — Encounter: Payer: Self-pay | Admitting: Nurse Practitioner

## 2022-08-09 VITALS — BP 130/82 | HR 72 | Temp 97.3°F | Resp 17 | Ht 66.0 in | Wt 162.4 lb

## 2022-08-09 DIAGNOSIS — F3341 Major depressive disorder, recurrent, in partial remission: Secondary | ICD-10-CM

## 2022-08-09 DIAGNOSIS — Z23 Encounter for immunization: Secondary | ICD-10-CM | POA: Diagnosis not present

## 2022-08-09 DIAGNOSIS — I10 Essential (primary) hypertension: Secondary | ICD-10-CM

## 2022-08-09 DIAGNOSIS — H5000 Unspecified esotropia: Secondary | ICD-10-CM

## 2022-08-09 DIAGNOSIS — E785 Hyperlipidemia, unspecified: Secondary | ICD-10-CM

## 2022-08-09 DIAGNOSIS — Z79899 Other long term (current) drug therapy: Secondary | ICD-10-CM | POA: Diagnosis not present

## 2022-08-09 DIAGNOSIS — R82998 Other abnormal findings in urine: Secondary | ICD-10-CM

## 2022-08-09 DIAGNOSIS — E663 Overweight: Secondary | ICD-10-CM

## 2022-08-09 DIAGNOSIS — L4050 Arthropathic psoriasis, unspecified: Secondary | ICD-10-CM | POA: Diagnosis not present

## 2022-08-09 DIAGNOSIS — K219 Gastro-esophageal reflux disease without esophagitis: Secondary | ICD-10-CM

## 2022-08-09 NOTE — Patient Instructions (Signed)

## 2022-08-09 NOTE — Progress Notes (Signed)
Complete Physical  Assessment and Plan:  Diagnoses and all orders for this visit:  Essential hypertension Controlled  Discussed DASH (Dietary Approaches to Stop Hypertension) DASH diet is lower in sodium than a typical American diet. Cut back on foods that are high in saturated fat, cholesterol, and trans fats. Eat more whole-grain foods, fish, poultry, and nuts Remain active and exercise as tolerated daily.  Monitor BP at home-Call if greater than 130/80.  Check CBC/CMP  Psoriatic arthritis ([place]) Continue Methotrexate No longer taking Stelara Follows with Dr. Amil Amen  She has f/u in 1 mo  Vitamin D deficiency Continue supplementation Check vitamin D level  Medication management All medications discussed and reviewed in full. All questions and concerns regarding medications addressed.     Hyperlipidemia, unspecified hyperlipidemia type Continue medications; Rosuvastatin Discussed lifestyle modifications. Recommended diet heavy in fruits and veggies, omega 3's. Decrease consumption of animal meats, cheeses, and dairy products. Remain active and exercise as tolerated. Continue to monitor. Check Lipids  Leukocytes in urine Check UA, microalbumin Stay well hydrated  Overweight (BMI 25.0-29.9) Continue low dose Phentermine Continue weight loss. Discussed appropriate BMI Goal of losing 1 lb per month. Diet modification. Physical activity. Encouraged/praised to build confidence.   Recurrent major depressive disorder, in partial remission (HCC) Continue Lexapro Lifestyle discussed: diet/exerise, sleep hygiene, stress management, hydration  Diplopia/esotrophia with inferior oblique overaction Following with [facility] neuro/ophth -  Continue to follow with [facility]  Continue to monitor  GERD Continue Pepcid No suspected reflux complications ([place]/stricture). Lifestyle modification:  wt loss, avoid meals 2-3h before bedtime. Consider eliminating food triggers:   chocolate, caffeine, EtOH, acid/spicy food.   Flu Vaccine Need Administered Patient tolerated well  Orders Placed This Encounter  Procedures   CBC with Differential/Platelet   COMPLETE METABOLIC PANEL WITH GFR   Lipid panel   Urinalysis, Routine w reflex microscopic    Discussed med's effects and SE's. Screening labs and tests as requested with regular follow-up as recommended.  Over 20 minutes of exam, counseling, chart review, and complex, high level critical decision making was performed this visit.   Future Appointments  Date Time Provider [place]  05/04/2023  9:00 AM Darrol Jump, NP [facility] None     HPI  66 y.o. female  presents for a complete physical and follow up for has Hemorrhoids; Allergy; Other abnormal glucose; Hyperlipidemia; Psoriatic arthritis ([place]); Essential hypertension; Vitamin D deficiency; Medication management; Recurrent major depressive disorder, in partial remission ([place]); Cervical arthritis; Overweight (BMI 25.0-29.9); Vertical diplopia; Binocular vision disorder with diplopia; Esotropia with inferior oblique overaction; Accommodative convergent strabismus; Osteoporosis; Atherosclerosis of aortic arch ([place]); and B12 deficiency on their problem list.   Overall she reports feeling well today.  No new complaints or significant changes since last OV, CPE.  She also follows with Dr. Sherley Bounds with [facility] for her back, and Dr. Mardelle Matte for her shoulder and hips, discussing possible bil hip replacements. Not ready at this time but may be considering before the end of the year.    She is continuing to work on weight loss.  Started Has Phentermine and lost 6 lb over the last 3 mo.  She is continuing to implement lifestyle modifications and remain active by walking.  She follows with Dr. Amil Amen for psoriatic arthritis, on MTX.Stelara was recently stopped. She is continuing Methotrexate.  She was on wellbutrin 300 mg daily for major  depression;  She is no longer taking this medication d/t diplopia. She presented in fall 2020 reporting persistent blurry  vision, diplopia, had seen Dr. Katy Fitch who ordered MRI which was unremarkable, concern for MG though labs were negative; she was referred to neurology, and again to [facility]/opth specialist at [facility] who felt high dose wellbutrin may have contributed, discontinued this but without significant improvement. Has ongoing double vision, has dry eye and watery discharge, recently had surgical correction for eye lid weakness, has medial canthus stitching and currently on abx.   She continues to follow with Dr. Langley Adie with [place].     BMI is Body mass index is 26.21 kg/m., she is working on diet and exercise. Wt Readings from Last 3 Encounters:  08/09/22 162 lb 6.4 oz (73.7 kg)  05/03/22 166 lb 6.4 oz (75.5 kg)  08/02/21 161 lb (73 kg)   Her blood pressure has been controlled at home, today their BP is BP: 130/82 She does not workout. She denies chest pain, shortness of breath, dizziness.   She is on cholesterol medication rosuvastatin 5 mg daily and denies myalgias. Her cholesterol is not at goal. The cholesterol last visit was:   Lab Results  Component Value Date   CHOL 223 (H) 05/03/2022   HDL 56 05/03/2022   LDLCALC 138 (H) 05/03/2022   TRIG 158 (H) 05/03/2022   CHOLHDL 4.0 05/03/2022    Last A1C in the office was:  Lab Results  Component Value Date   HGBA1C 5.6 05/03/2022    She has hx of left thyroid nodule; underwent US and biopsy in 2014 which showed benign results. TSHs have been stable.   Lab Results  Component Value Date   TSH 1.33 05/03/2022   Last GFR: Lab Results  Component Value Date   GFRNONAA 87 01/26/2021   Patient is on Vitamin D supplement, taking 50000 3-4 days a week  Lab Results  Component Value Date   VD25OH 58 05/03/2022     She has hx of low B12, not on supplement, does want levels checked today Lab Results  Component Value Date    VITAMINB12 209 08/02/2021     Current Medications:  Current Outpatient Medications on File Prior to Visit  Medication Sig Dispense Refill   acetaminophen (TYLENOL) 500 MG tablet Take by mouth every 6 (six) hours as needed.     folic acid (FOLVITE) 1 MG tablet Take 1 mg by mouth daily.     methotrexate (RHEUMATREX) 10 MG tablet Take 10 mg by mouth. Caution: Chemotherapy. Protect from light. Takes 12 tablets a week.     phentermine (ADIPEX-P) 37.5 MG tablet Take 1/2 to 1 tablet every morning as needed for dieting & weight loss 30 tablet 2   PROCTOSOL HC 2.5 % rectal cream PLACE 1 APPLICATION RECTALLY 2 (TWO) TIMES DAILY. 28.35 g 1   rosuvastatin (CRESTOR) 10 MG tablet Take  1 tablet  Daily  for Cholesterol 90 tablet 3   Vitamin D, Ergocalciferol, (DRISDOL) 1.25 MG (50000 UNIT) CAPS capsule TAKE 1 CAPSULE BY MOUTH 3X PER WEEK FOR VIT D DEFICIENCEY 39 capsule 1   benzonatate (TESSALON) 200 MG capsule Take 1 cap three times a day as needed for cough. (Patient not taking: Reported on 08/09/2022) 30 capsule 1   escitalopram (LEXAPRO) 10 MG tablet TAKE 1 TABLET BY MOUTH DAILY FOR MOOD (Patient not taking: Reported on 08/09/2022) 90 tablet 3   famotidine (PEPCID) 20 MG tablet Take 1 tablet (20 mg total) by mouth 2 (two) times daily before a meal. For reflux. 180 tablet 1   promethazine-dextromethorphan (PROMETHAZINE-DM) 6.25-15 MG/5ML  syrup Take 5 mLs by mouth 4 (four) times daily as needed for cough. (Patient not taking: Reported on 08/09/2022) 240 mL 1   ustekinumab (STELARA) 45 MG/0.5ML SOSY injection Inject 45 mg into the skin. Injects once every quarter (Patient not taking: Reported on 08/09/2022)     No current facility-administered medications on file prior to visit.   Allergies:  No Known Allergies Medical History:  She has Hemorrhoids; Allergy; Other abnormal glucose; Hyperlipidemia; Psoriatic arthritis ([place]); Essential hypertension; Vitamin D deficiency; Medication management; Recurrent major  depressive disorder, in partial remission ([place]); Cervical arthritis; Overweight (BMI 25.0-29.9); Vertical diplopia; Binocular vision disorder with diplopia; Esotropia with inferior oblique overaction; Accommodative convergent strabismus; Osteoporosis; Atherosclerosis of aortic arch ([place]); and B12 deficiency on their problem list. Health Maintenance:   Immunization History  Administered Date(s) Administered   Influenza, High Dose Seasonal PF 06/04/2019, 08/16/2020, 08/02/2021   Moderna Sars-Covid-2 Vaccination 11/24/2019, 12/22/2019   PPD Test 05/07/2017   Pneumococcal Conjugate-13 06/04/2019   Pneumococcal Polysaccharide-23 08/16/2020   Tdap 12/09/2015    Patient Care Team: Unk Pinto, MD as PCP - General (Internal Medicine) Wonda Horner, MD as Consulting Physician (Gastroenterology) Hennie Duos, MD as Consulting Physician (Rheumatology) Marchia Bond, MD as Consulting Physician (Orthopedic Surgery) Elberta Spaniel, MD as Referring Physician (Neurology)  Surgical History:  She has a past surgical history that includes Cholecystectomy (1997); Shoulder surgery (03/2012); Cervical fusion (2005); Anterior cervical decomp/discectomy fusion (08/08/2012); Total shoulder arthroplasty (09/24/2012); Eye surgery; Lumbar laminectomy/decompression microdiscectomy (01/01/2013); Lumbar fusion (01/01/2013); Abdominal hysterectomy (1997); Mass excision (N/A, 01/29/2013); Hemorrhoid surgery (N/A, 09/02/2013); Reduction mammaplasty (Bilateral); and Total shoulder arthroplasty (Left, 02/18/2020). Family History:  Herfamily history includes Asthma in her daughter; Breast cancer in her cousin, cousin, and maternal aunt; CAD (age of onset: 101) in her mother; Cancer in her paternal grandfather; Dementia in her mother; Diabetes in her father; Goiter in her maternal grandmother; Heart disease in her father; Lung cancer (age of onset: 84) in her maternal grandfather; Ovarian cancer (age of onset: 76) in her paternal  grandmother; Pancreatitis in her father; Thyroid cancer in her mother; Thyroid disease in her maternal grandmother and another family member. Social History:  She reports that she has never smoked. She has never used smokeless tobacco. She reports that she does not drink alcohol and does not use drugs.   Review of Systems: Review of Systems  Constitutional:  Negative for malaise/fatigue and weight loss.  HENT:  Negative for hearing loss and tinnitus.   Eyes:  Negative for blurred vision, double vision, photophobia, pain, discharge and redness.  Respiratory:  Negative for cough, sputum production, shortness of breath and wheezing.   Cardiovascular:  Negative for chest pain, palpitations, orthopnea, claudication, leg swelling and PND.  Gastrointestinal:  Positive for heartburn. Negative for abdominal pain, blood in stool, constipation, diarrhea, melena, nausea and vomiting.  Genitourinary: Negative.   Musculoskeletal:  Positive for joint pain. Negative for falls and myalgias.  Skin:  Negative for rash.  Neurological:  Negative for dizziness, tingling, tremors, sensory change, speech change, focal weakness, seizures, loss of consciousness, weakness and headaches.  Endo/Heme/Allergies:  Negative for polydipsia.  Psychiatric/Behavioral: Negative.  Negative for depression, memory loss, substance abuse and suicidal ideas. The patient is not nervous/anxious and does not have insomnia.   All other systems reviewed and are negative.   Physical Exam: Estimated body mass index is 26.21 kg/m as calculated from the following:   Height as of this encounter: '5\' 6"'$  (1.676 m).   Weight  as of this encounter: 162 lb 6.4 oz (73.7 kg). BP 130/82   Pulse 72   Temp (!) 97.3 F (36.3 C)   Resp 17   Ht '5\' 6"'$  (1.676 m)   Wt 162 lb 6.4 oz (73.7 kg)   SpO2 99%   BMI 26.21 kg/m  General Appearance: Well nourished, in no apparent distress.  Eyes: Stitching to bilateral medial canthus.  No erythema, edema.   Pupils symmetrical but with significant difficulty focusing with erratic pupil reactions,EOM abnormal, bil interior oblique overreaction with strabismus R>L, conjunctiva no swelling or erythema  Sinuses: No Frontal/maxillary tenderness  ENT/Mouth: Ext aud canals clear, normal light reflex with TMs without erythema, bulging. Good dentition. No erythema, swelling, or exudate on post pharynx. Tonsils not swollen or erythematous. Hearing normal.  Neck: Supple, thyroid subtly enlarged, L > R, no distinct palpable nodules. No bruits  Respiratory: Respiratory effort normal, BS equal bilaterally without rales, rhonchi, wheezing or stridor.  Cardio: RRR without murmurs, rubs or gallops. Brisk peripheral pulses without edema.  Chest: symmetric, with normal excursions and percussion.  Breasts: Symmetric, without lumps, nipple discharge, retractions. Well healed reduction scars.  Abdomen: Soft, nontender, no guarding, rebound, hernias, masses, or organomegaly.  Lymphatics: Non tender without lymphadenopathy.  Genitourinary: Defer Musculoskeletal: Full ROM all peripheral extremities, 5/5 strength, and normal gait.  Skin: Warm, dry, without lesions, ecchymosis. psoriatic rash scattered - erythematous, scaly/flaky Neuro: Cranial nerves intact excepting III, IV, VI as noted by EOMs,  reflexes equal bilaterally. Normal muscle tone, no cerebellar symptoms. Sensation intact.  Psych: Awake and oriented X 3, pleasant, normal affect, Insight and Judgment appropriate.    Gabrielle Duncan 8:39 AM [facility]

## 2022-08-10 LAB — URINALYSIS, ROUTINE W REFLEX MICROSCOPIC
Bilirubin Urine: NEGATIVE
Glucose, UA: NEGATIVE
Hgb urine dipstick: NEGATIVE
Ketones, ur: NEGATIVE
Leukocytes,Ua: NEGATIVE
Nitrite: NEGATIVE
Protein, ur: NEGATIVE
Specific Gravity, Urine: 1.006 (ref 1.001–1.035)
pH: 5.5 (ref 5.0–8.0)

## 2022-08-10 LAB — COMPLETE METABOLIC PANEL WITH GFR
AG Ratio: 2 (calc) (ref 1.0–2.5)
ALT: 15 U/L (ref 6–29)
AST: 14 U/L (ref 10–35)
Albumin: 4.3 g/dL (ref 3.6–5.1)
Alkaline phosphatase (APISO): 64 U/L (ref 37–153)
BUN: 11 mg/dL (ref 7–25)
CO2: 26 mmol/L (ref 20–32)
Calcium: 9.5 mg/dL (ref 8.6–10.4)
Chloride: 104 mmol/L (ref 98–110)
Creat: 0.62 mg/dL (ref 0.50–1.05)
Globulin: 2.2 g/dL (calc) (ref 1.9–3.7)
Glucose, Bld: 102 mg/dL — ABNORMAL HIGH (ref 65–99)
Potassium: 4.5 mmol/L (ref 3.5–5.3)
Sodium: 139 mmol/L (ref 135–146)
Total Bilirubin: 0.4 mg/dL (ref 0.2–1.2)
Total Protein: 6.5 g/dL (ref 6.1–8.1)
eGFR: 97 mL/min/{1.73_m2} (ref >=60)

## 2022-08-10 LAB — CBC WITH DIFFERENTIAL/PLATELET
Absolute Monocytes: 352 cells/uL (ref 200–950)
Basophils Absolute: 41 cells/uL (ref 0–200)
Basophils Relative: 0.8 %
Eosinophils Absolute: 112 cells/uL (ref 15–500)
Eosinophils Relative: 2.2 %
HCT: 37.3 % (ref 35.0–45.0)
Hemoglobin: 12.9 g/dL (ref 11.7–15.5)
Lymphs Abs: 1826 cells/uL (ref 850–3900)
MCH: 31.3 pg (ref 27.0–33.0)
MCHC: 34.6 g/dL (ref 32.0–36.0)
MCV: 90.5 fL (ref 80.0–100.0)
MPV: 11 fL (ref 7.5–12.5)
Monocytes Relative: 6.9 %
Neutro Abs: 2769 cells/uL (ref 1500–7800)
Neutrophils Relative %: 54.3 %
Platelets: 255 10*3/uL (ref 140–400)
RBC: 4.12 10*6/uL (ref 3.80–5.10)
RDW: 12 % (ref 11.0–15.0)
Total Lymphocyte: 35.8 %
WBC: 5.1 10*3/uL (ref 3.8–10.8)

## 2022-08-10 LAB — LIPID PANEL
Cholesterol: 189 mg/dL (ref <200)
HDL: 56 mg/dL (ref >=50)
LDL Cholesterol (Calc): 112 mg/dL (calc) — ABNORMAL HIGH
Non-HDL Cholesterol (Calc): 133 mg/dL (calc) — ABNORMAL HIGH (ref <130)
Total CHOL/HDL Ratio: 3.4 (calc) (ref <5.0)
Triglycerides: 103 mg/dL (ref <150)

## 2022-10-02 HISTORY — PX: FRACTURE SURGERY: SHX138

## 2022-11-07 ENCOUNTER — Encounter: Payer: Self-pay | Admitting: Nurse Practitioner

## 2022-11-07 ENCOUNTER — Encounter (HOSPITAL_BASED_OUTPATIENT_CLINIC_OR_DEPARTMENT_OTHER): Payer: Self-pay | Admitting: Emergency Medicine

## 2022-11-07 ENCOUNTER — Emergency Department (HOSPITAL_BASED_OUTPATIENT_CLINIC_OR_DEPARTMENT_OTHER)
Admission: EM | Admit: 2022-11-07 | Discharge: 2022-11-07 | Disposition: A | Discharge status: Home / Self Care | Payer: Managed Care, Other (non HMO) | Attending: Emergency Medicine | Admitting: Emergency Medicine

## 2022-11-07 ENCOUNTER — Other Ambulatory Visit: Payer: Self-pay

## 2022-11-07 ENCOUNTER — Ambulatory Visit: Payer: Managed Care, Other (non HMO) | Admitting: Nurse Practitioner

## 2022-11-07 ENCOUNTER — Emergency Department (HOSPITAL_BASED_OUTPATIENT_CLINIC_OR_DEPARTMENT_OTHER): Payer: Managed Care, Other (non HMO)

## 2022-11-07 VITALS — BP 112/70 | HR 87 | Temp 98.1°F | Ht 66.0 in | Wt 152.0 lb

## 2022-11-07 DIAGNOSIS — R112 Nausea with vomiting, unspecified: Secondary | ICD-10-CM | POA: Diagnosis not present

## 2022-11-07 DIAGNOSIS — R1010 Upper abdominal pain, unspecified: Secondary | ICD-10-CM

## 2022-11-07 DIAGNOSIS — K859 Acute pancreatitis without necrosis or infection, unspecified: Secondary | ICD-10-CM

## 2022-11-07 DIAGNOSIS — R1013 Epigastric pain: Secondary | ICD-10-CM | POA: Diagnosis present

## 2022-11-07 DIAGNOSIS — K529 Noninfective gastroenteritis and colitis, unspecified: Secondary | ICD-10-CM | POA: Diagnosis not present

## 2022-11-07 DIAGNOSIS — R531 Weakness: Secondary | ICD-10-CM | POA: Diagnosis not present

## 2022-11-07 LAB — COMPREHENSIVE METABOLIC PANEL
ALT: 131 U/L — ABNORMAL HIGH (ref 0–44)
AST: 71 U/L — ABNORMAL HIGH (ref 15–41)
Albumin: 4.4 g/dL (ref 3.5–5.0)
Alkaline Phosphatase: 97 U/L (ref 38–126)
Anion gap: 12 (ref 5–15)
BUN: 11 mg/dL (ref 8–23)
CO2: 25 mmol/L (ref 22–32)
Calcium: 9.7 mg/dL (ref 8.9–10.3)
Chloride: 104 mmol/L (ref 98–111)
Creatinine, Ser: 0.74 mg/dL (ref 0.44–1.00)
GFR, Estimated: >60 mL/min (ref >60)
Glucose, Bld: 112 mg/dL — ABNORMAL HIGH (ref 70–99)
Potassium: 3.8 mmol/L (ref 3.5–5.1)
Sodium: 141 mmol/L (ref 135–145)
Total Bilirubin: 0.5 mg/dL (ref 0.3–1.2)
Total Protein: 7 g/dL (ref 6.5–8.1)

## 2022-11-07 LAB — LIPASE, BLOOD: Lipase: 14 U/L (ref 11–51)

## 2022-11-07 LAB — CBC
HCT: 39.9 % (ref 36.0–46.0)
Hemoglobin: 14 g/dL (ref 12.0–15.0)
MCH: 30.6 pg (ref 26.0–34.0)
MCHC: 35.1 g/dL (ref 30.0–36.0)
MCV: 87.3 fL (ref 80.0–100.0)
Platelets: 302 K/uL (ref 150–400)
RBC: 4.57 MIL/uL (ref 3.87–5.11)
RDW: 12.9 % (ref 11.5–15.5)
WBC: 8.9 K/uL (ref 4.0–10.5)
nRBC: 0 % (ref 0.0–0.2)

## 2022-11-07 LAB — LACTIC ACID, PLASMA: Lactic Acid, Venous: 0.8 mmol/L (ref 0.5–1.9)

## 2022-11-07 MED ORDER — LACTATED RINGERS IV BOLUS
1000.0000 mL | Freq: Once | INTRAVENOUS | Status: AC
  Administered 2022-11-07: 1000 mL via INTRAVENOUS

## 2022-11-07 MED ORDER — DICYCLOMINE HCL 20 MG PO TABS: 20.0000 mg | ORAL_TABLET | Freq: Two times a day (BID) | ORAL | 0 refills | Status: DC

## 2022-11-07 MED ORDER — ONDANSETRON 4 MG PO TBDP: 4.0000 mg | ORAL_TABLET | Freq: Three times a day (TID) | ORAL | 0 refills | Status: DC | PRN

## 2022-11-07 MED ORDER — ONDANSETRON HCL 4 MG/2ML IJ SOLN
4.0000 mg | Freq: Once | INTRAMUSCULAR | Status: AC
  Administered 2022-11-07: 4 mg via INTRAVENOUS
  Filled 2022-11-07: qty 2

## 2022-11-07 MED ORDER — IOHEXOL 350 MG/ML SOLN
100.0000 mL | Freq: Once | INTRAVENOUS | Status: AC | PRN
  Administered 2022-11-07: 85 mL via INTRAVENOUS

## 2022-11-07 MED ORDER — DICYCLOMINE HCL 10 MG PO CAPS
10.0000 mg | ORAL_CAPSULE | Freq: Once | ORAL | Status: AC
  Administered 2022-11-07: 10 mg via ORAL
  Filled 2022-11-07: qty 1

## 2022-11-07 NOTE — ED Notes (Signed)
Discharge paperwork given and verbally understood. 

## 2022-11-07 NOTE — Patient Instructions (Addendum)
Your Location is set [place], [place] [address]  Change My Location  [place], [place], [place] [address] [phone]  [facility]  Acute Pancreatitis  Acute pancreatitis happens when there is sudden swelling and irritation of the pancreas. The pancreas is a gland in your body that helps to control blood sugar. This gland also helps to digest food. This condition can last a few days and cause serious problems. Some problems can be life-threatening. The lungs, heart, and kidneys may stop working. What are the causes? Causes may include: Heavy alcohol use. Drug use. Gallstones. An abnormal growth of tissue (tumor) in the pancreas. Other causes include: Some medicines or some chemicals. Diabetes or infection. High levels of a type of fat in your blood. High levels of calcium in your blood. Damage caused by: An accident. The poison (venom) from a scorpion sting. Belly (abdominal) surgery. The body's defense system (immune system) attacking the pancreas (autoimmune pancreatitis). Genes that are passed from parent to child (inherited). Sometimes, the cause is not known. What are the signs or symptoms? Pain in the upper belly that may be felt in the back. The pain may be very bad. It often gets worse after you eat. A tender and swollen belly. Feeling like you may vomit (nausea) and vomiting. Fever. How is this treated? A stay in the hospital, in many cases. Pain medicine. Fluid through an IV tube. Placing a tube in the stomach to take out the stomach contents. This also helps you stop vomiting. Not eating until you vomit less. Antibiotic medicines, if you have an infection. Steroid medicines, if your problem is caused by attacks on your body's own tissues by your defense system. Surgery, if your problem is caused by gallstones or other blockage. Treating other health problems that may be the cause. Follow these instructions at  home: Medicines Take over-the-counter and prescription medicines only as told by your doctor. If you were prescribed an antibiotic medicine, take it as told by your doctor. Do not stop taking it even if you start to feel better. If told, take steps to prevent problems with pooping (constipation). You may need to: Take medicines. You will be told what medicines to take. Eat foods that are high in fiber. These include beans, whole grains, and fresh fruits and vegetables. Limit foods that are high in fat and sugar. These include fried or sweet foods. Ask your doctor if you should avoid driving or using machines while you are taking your medicine. Eating and drinking  Follow instructions from your doctor about what to eat and drink. You may need to: Avoid alcohol. Eat foods that do not have a lot of fat in them. Eat small meals often. Do not eat big meals. Drink enough fluid to keep your pee (urine) pale yellow. Do not drink alcohol if it caused your condition. General instructions Do not smoke or use any products that contain nicotine or tobacco. If you need help quitting, ask your doctor. Get plenty of rest. Check your blood sugar at home if your doctor tells you to. Keep all follow-up visits. Contact a doctor if: You do not get better as fast as expected. Your symptoms get worse. You have new symptoms. You have pain or weakness that lasts a long time. You keep feeling like you may vomit. You get better and then pain comes back. You have a fever. Get help right away if: You vomit every time you eat or drink. Your pain gets very bad. Your  skin or the white parts of your eyes turn yellow. You have sudden swelling in your belly. You feel dizzy or you faint. Your blood sugar is high (over 300 mg/dL). You vomit blood. These symptoms may be an emergency. Do not wait to see if the symptoms will go away. Get help right away. Call 911. Summary Acute pancreatitis happens when there is  sudden swelling and irritation of the pancreas. This condition is often caused by heavy alcohol use, drug use, or gallstones. You will likely have to stay in the hospital for treatment. This information is not intended to replace advice given to you by your health care provider. Make sure you discuss any questions you have with your health care provider. Document Revised: 08/09/2021 Document Reviewed: 08/09/2021 Elsevier Patient Education  [place].

## 2022-11-07 NOTE — ED Triage Notes (Signed)
Pt arrives to ED with c/o nausea, vomiting, diarrhea, and mid-abdominal pain w/ radiation to back.

## 2022-11-07 NOTE — Progress Notes (Signed)
Assessment and Plan:  Gabrielle Duncan was seen today for an episodic visit.  1. Acute pancreatitis, unspecified complication status, unspecified pancreatitis type Report to ER for further review and evaluation.  2. Nausea and vomiting, unspecified vomiting type Report tp ER for IV hydration.   3. Pain of upper abdomen   4. Weakness     Notify office for further evaluation and treatment, questions or concerns if s/s fail to improve after visit to ER. The risks and benefits of my recommendations, as well as other treatment options were discussed with the patient today. Questions were answered.  Further disposition pending results of labs. Discussed med's effects and SE's.    Over 15 minutes of exam, counseling, chart review, and critical decision making was performed.   Future Appointments  Date Time Provider [place]  11/13/2022  9:00 AM Darrol Jump, NP [facility] None  05/04/2023  9:00 AM Ahlam Piscitelli, Kenney Houseman, NP [facility] None    ------------------------------------------------------------------------------------------------------------------   HPI BP 112/70   Pulse 87   Temp 98.1 F (36.7 C)   Ht '5\' 6"'$  (1.676 m)   Wt 152 lb (68.9 kg)   SpO2 99%   BMI 24.53 kg/m   66 y.o.female presents for evaluation of upper abdominal pain.    Onset 2 weeks ago felt sharp shooting pain in the mid abdomen that wrapped around to her back.  Within  24 hours she would vomit and have diarrhea.  The next day she had chick fila and vomited with diarrhea. Thought it was the stomach but or flu. She then went to eat toast and water and continued to have vomiting with diarrhea.  She tried to eat food with more substance but unable to hold down.  She is now weak and reports weight loss.  Feels dehydrated.  She is even unable to tolerate Tylenol.  She has stopped all of her medications, unable to hold down anything.  This includes Escitaplopram, Methotrexate, Rosuvastatin.    She is  continuing to have stomach spasms, and feels as though the stomach continues to have painful waves of stomach pain that radiates around to back.  She is still not taking in foods or liquid.  Has dry mucus membranes.  Stomach now feels bloated without eating or drinking. No known hx of pancreatitis.  Has had cholecystectomy.  Denies fever, chills, URI symptoms.   Review of last blood work 08/2022 WNL.  Two days ago she started to break out in a generalized rash rash that is now itching.    Denies any recent travel out of the country.   BMI is Body mass index is 24.53 kg/m.,  Wt Readings from Last 3 Encounters:  11/07/22 152 lb (68.9 kg)  08/09/22 162 lb 6.4 oz (73.7 kg)  05/03/22 166 lb 6.4 oz (75.5 kg)    Past Medical History:  Diagnosis Date   Allergy    Left thyroid nodule 03/31/2013   Benign per thyroid biopsy in 2014   Mass of chest wall 12/2012   tender to the touch   Osteoarthritis    Psoriatic arthritis (HCC)    Seasonal allergies      No Known Allergies  Current Outpatient Medications on File Prior to Visit  Medication Sig   acetaminophen (TYLENOL) 500 MG tablet Take by mouth every 6 (six) hours as needed.   Vitamin D, Ergocalciferol, (DRISDOL) 1.25 MG (50000 UNIT) CAPS capsule TAKE 1 CAPSULE BY MOUTH 3X PER WEEK FOR VIT D DEFICIENCEY   benzonatate (TESSALON) 200  MG capsule Take 1 cap three times a day as needed for cough. (Patient not taking: Reported on 08/09/2022)   escitalopram (LEXAPRO) 10 MG tablet TAKE 1 TABLET BY MOUTH DAILY FOR MOOD (Patient not taking: Reported on 08/09/2022)   famotidine (PEPCID) 20 MG tablet Take 1 tablet (20 mg total) by mouth 2 (two) times daily before a meal. For reflux.   folic acid (FOLVITE) 1 MG tablet Take 1 mg by mouth daily. (Patient not taking: Reported on 11/07/2022)   methotrexate (RHEUMATREX) 10 MG tablet Take 10 mg by mouth. Caution: Chemotherapy. Protect from light. Takes 12 tablets a week. (Patient not taking: Reported on  11/07/2022)   phentermine (ADIPEX-P) 37.5 MG tablet Take 1/2 to 1 tablet every morning as needed for dieting & weight loss (Patient not taking: Reported on 11/07/2022)   PROCTOSOL HC 2.5 % rectal cream PLACE 1 APPLICATION RECTALLY 2 (TWO) TIMES DAILY. (Patient not taking: Reported on 11/07/2022)   promethazine-dextromethorphan (PROMETHAZINE-DM) 6.25-15 MG/5ML syrup Take 5 mLs by mouth 4 (four) times daily as needed for cough. (Patient not taking: Reported on 08/09/2022)   rosuvastatin (CRESTOR) 10 MG tablet Take  1 tablet  Daily  for Cholesterol (Patient not taking: Reported on 11/07/2022)   ustekinumab (STELARA) 45 MG/0.5ML SOSY injection Inject 45 mg into the skin. Injects once every quarter (Patient not taking: Reported on 08/09/2022)   No current facility-administered medications on file prior to visit.    ROS: all negative except what is noted in the HPI.   Physical Exam:  BP 112/70   Pulse 87   Temp 98.1 F (36.7 C)   Ht '5\' 6"'$  (1.676 m)   Wt 152 lb (68.9 kg)   SpO2 99%   BMI 24.53 kg/m   General Appearance: NAD.  Awake, conversant and cooperative. Eyes: PERRLA, EOMs intact.  Sclera white.  Conjunctiva without erythema. Sinuses: No frontal/maxillary tenderness.  No nasal discharge. Nares patent.  ENT/Mouth: Ext aud canals clear.  Bilateral TMs w/DOL and without erythema or bulging. Hearing intact.  Posterior pharynx without swelling or exudate.  Tonsils without swelling or erythema.  Neck: Supple.  No masses, nodules or thyromegaly. Respiratory: Effort is regular with non-labored breathing. Breath sounds are equal bilaterally without rales, rhonchi, wheezing or stridor.  Cardio: RRR with no MRGs. Brisk peripheral pulses without edema.  Abdomen: Hypoactive BS x4, tender to palpation RUQ, LUQ.  Marland Kitchen Lymphatics: Non tender without lymphadenopathy.  Musculoskeletal: Full ROM, 5/5 strength, normal ambulation.  No clubbing or cyanosis. Skin: Generalized maculopapular rash, excoriation.  Appropriate color for ethnicity. Warm. Neuro: CN II-XII grossly normal. Normal muscle tone without cerebellar symptoms and intact sensation.   Psych: AO X 3,  appropriate mood and affect, insight and judgment.    Darrol Jump, NP 2:14 PM [address] [facility]

## 2022-11-07 NOTE — ED Provider Notes (Signed)
[facility] Provider Note   CSN: [id number] Arrival date & time: 11/07/22  1451     History  Chief Complaint  Patient presents with   Abdominal Pain    Gabrielle Duncan is a 67 y.o. female.   Abdominal Pain Associated symptoms: diarrhea, nausea and vomiting      67 year old female with medical history significant for psoriatic arthritis who presents to the emergency department with nausea, vomiting, diarrhea for the past 5 days.  She endorsed some epigastric abdominal pain with radiation to her back.  She was sent to the emergency department to be evaluated for possible pancreatitis.  She also endorses generalized abdominal cramping.  She has been able to keep anything down.  She states "I have not passed gas in 2 weeks."  She also endorses worsening cramping after any attempts at eating.  Denies any hematemesis, denies any hematochezia or melena.  Home Medications Prior to Admission medications   Medication Sig Start Date End Date Taking? Authorizing Provider  dicyclomine (BENTYL) 20 MG tablet Take 1 tablet (20 mg total) by mouth 2 (two) times daily. 11/07/22  Yes Regan Lemming, MD  ondansetron (ZOFRAN-ODT) 4 MG disintegrating tablet Take 1 tablet (4 mg total) by mouth every 8 (eight) hours as needed. 11/07/22  Yes Regan Lemming, MD  acetaminophen (TYLENOL) 500 MG tablet Take by mouth every 6 (six) hours as needed.    [provider]  benzonatate (TESSALON) 200 MG capsule Take 1 cap three times a day as needed for cough. Patient not taking: Reported on 08/09/2022 06/21/21   Liane Comber, NP  escitalopram (LEXAPRO) 10 MG tablet TAKE 1 TABLET BY MOUTH DAILY FOR MOOD Patient not taking: Reported on 08/09/2022 12/02/20   Liane Comber, NP  famotidine (PEPCID) 20 MG tablet Take 1 tablet (20 mg total) by mouth 2 (two) times daily before a meal. For reflux. 08/16/20 08/16/21  Liane Comber, NP  folic acid (FOLVITE) 1 MG tablet Take 1 mg  by mouth daily. Patient not taking: Reported on 11/07/2022    [provider]  methotrexate (RHEUMATREX) 10 MG tablet Take 10 mg by mouth. Caution: Chemotherapy. Protect from light. Takes 12 tablets a week. Patient not taking: Reported on 11/07/2022    [provider]  phentermine (ADIPEX-P) 37.5 MG tablet Take 1/2 to 1 tablet every morning as needed for dieting & weight loss Patient not taking: Reported on 11/07/2022 05/03/22   Darrol Jump, NP  PROCTOSOL HC 2.5 % rectal cream PLACE 1 APPLICATION RECTALLY 2 (TWO) TIMES DAILY. Patient not taking: Reported on 11/07/2022 05/11/20   Liane Comber, NP  promethazine-dextromethorphan (PROMETHAZINE-DM) 6.25-15 MG/5ML syrup Take 5 mLs by mouth 4 (four) times daily as needed for cough. Patient not taking: Reported on 08/09/2022 06/21/21   Liane Comber, NP  rosuvastatin (CRESTOR) 10 MG tablet Take  1 tablet  Daily  for Cholesterol Patient not taking: Reported on 11/07/2022 08/03/21   Liane Comber, NP  ustekinumab [facility]) 45 MG/0.5ML SOSY injection Inject 45 mg into the skin. Injects once every quarter Patient not taking: Reported on 08/09/2022    [provider]  Vitamin D, Ergocalciferol, (DRISDOL) 1.25 MG (50000 UNIT) CAPS capsule TAKE 1 CAPSULE BY MOUTH 3X PER WEEK FOR VIT D DEFICIENCEY 09/23/20   Liane Comber, NP      Allergies    Patient has no known allergies.    Review of Systems   Review of Systems  Gastrointestinal:  Positive for abdominal pain, diarrhea,  nausea and vomiting.  All other systems reviewed and are negative.   Physical Exam Updated Vital Signs BP (!) 151/72 (BP Location: Right Arm)   Pulse 70   Temp 97.7 F (36.5 C) (Oral)   Resp 16   SpO2 100%  Physical Exam Vitals and nursing note reviewed.  Constitutional:      General: She is not in acute distress.    Appearance: She is well-developed.  HENT:     Head: Normocephalic and atraumatic.     Mouth/Throat:     Mouth: Mucous membranes are  dry.  Eyes:     Conjunctiva/sclera: Conjunctivae normal.  Cardiovascular:     Rate and Rhythm: Normal rate and regular rhythm.  Pulmonary:     Effort: Pulmonary effort is normal. No respiratory distress.     Breath sounds: Normal breath sounds.  Abdominal:     Palpations: Abdomen is soft.     Tenderness: There is generalized abdominal tenderness.  Musculoskeletal:        General: No swelling.     Cervical back: Neck supple.  Skin:    General: Skin is warm and dry.     Capillary Refill: Capillary refill takes less than 2 seconds.  Neurological:     Mental Status: She is alert.  Psychiatric:        Mood and Affect: Mood normal.     ED Results / Procedures / Treatments   Labs (all labs ordered are listed, but only abnormal results are displayed) Labs Reviewed  COMPREHENSIVE METABOLIC PANEL - Abnormal; Notable for the following components:      Result Value   Glucose, Bld 112 (*)    AST 71 (*)    ALT 131 (*)    All other components within normal limits  LIPASE, BLOOD  CBC  LACTIC ACID, PLASMA    EKG None  Radiology CT Angio Abd/Pel W and/or Wo Contrast  Result Date: 11/07/2022 CLINICAL DATA:  Mesenteric ischemia, acute. Nausea, vomiting and diarrhea. EXAM: CTA ABDOMEN AND PELVIS WITHOUT AND WITH CONTRAST TECHNIQUE: Multidetector CT imaging of the abdomen and pelvis was performed using the standard protocol during bolus administration of intravenous contrast. Multiplanar reconstructed images and MIPs were obtained and reviewed to evaluate the vascular anatomy. RADIATION DOSE REDUCTION: This exam was performed according to the departmental dose-optimization program which includes automated exposure control, adjustment of the mA and/or kV according to patient size and/or use of iterative reconstruction technique. CONTRAST:  58m OMNIPAQUE IOHEXOL 350 MG/ML SOLN COMPARISON:  None Available. FINDINGS: VASCULAR Aorta: Mild aortic atherosclerosis without aneurysm. Celiac: Mild  narrowing at the celiac origin, not likely flow limiting. SMA: Mild narrowing of the proximal superior mesenteric artery, not likely flow limiting. Renals: No renal artery stenosis. Small accessory artery on the left serving the lower pole. IMA: Inferior mesenteric artery is patent. Inflow: Normal Proximal Outflow: Normal Veins: Normal Review of the MIP images confirms the above findings. NON-VASCULAR Lower chest: Normal Hepatobiliary: Multiple simple appearing liver cysts. No evidence of solid mass. Previous cholecystectomy. Pancreas: Normal. Incidental duodenal diverticulum adjacent to the pancreatic head. Spleen: Normal Adrenals/Urinary Tract: Adrenal glands are normal. Kidneys are normal. No stone, mass or hydronephrosis. Bladder is normal. Stomach/Bowel: Stomach and small intestine are normal. Normal colon. Normal appendix. No imaging finding to suggest bowel or mesenteric ischemia. Lymphatic: No lymphadenopathy. Reproductive: Previous hysterectomy.  No pelvic mass. Other: No free fluid or air. Musculoskeletal: Previous decompression and fusion at L4-5. Ordinary degenerative changes above and below that. IMPRESSION: 1.  Mild aortic atherosclerosis without aneurysm. 2. 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 3. No evidence of bowel or mesenteric ischemia. 1. Non-vascular. 2. No acute finding. 3. Multiple simple appearing liver cysts. 4. Previous cholecystectomy and hysterectomy. 5. Previous decompression and fusion at L4-5. Ordinary degenerative changes above and below that. Aortic Atherosclerosis (ICD10-I70.0). Electronically Signed   By: Nelson Chimes M.D.   On: 11/07/2022 16:58    Procedures Procedures    Medications Ordered in ED Medications  dicyclomine (BENTYL) capsule 10 mg (10 mg Oral Given 11/07/22 1644)  lactated ringers bolus 1,000 mL (1,000 mLs Intravenous New Bag/Given 11/07/22 1646)  ondansetron (ZOFRAN) injection 4 mg (4 mg Intravenous  Given 11/07/22 1647)  iohexol (OMNIPAQUE) 350 MG/ML injection 100 mL (85 mLs Intravenous Contrast Given 11/07/22 1633)    ED Course/ Medical Decision Making/ A&P                             Medical Decision Making Amount and/or Complexity of Data Reviewed Labs: ordered. Radiology: ordered.  Risk Prescription drug management.     67 year old female with medical history significant for psoriatic arthritis who presents to the emergency department with nausea, vomiting, diarrhea for the past 5 days.  She endorsed some epigastric abdominal pain with radiation to her back.  She was sent to the emergency department to be evaluated for possible pancreatitis due to abdominal pain radiating to her back..  She also endorses generalized abdominal cramping.  She has been able to keep anything down.  She states "I have not passed gas in 2 weeks."  She also endorses worsening cramping after any attempts at eating.  Denies any hematemesis, denies any hematochezia or melena.  On arrival, the patient was afebrile, not tachycardic or tachypneic, mildly hypertensive BP 146/87, saturating 100% on room air.  Sinus rhythm noted on cardiac telemetry.  Physical exam significant for generalized mild abdominal tenderness to palpation, no rebound or guarding.  Mucous membranes were dry.  Patient symptoms are most likely consistent with a viral gastroenteritis.  Considered pancreatitis, AAA, aortic dissection, mesenteric ischemia (acute or chronic), small bowel obstruction, diverticulitis.  Laboratory evaluation significant for CBC without leukocytosis or anemia, CMP generally unremarkable, mildly elevated LFTs with an AST of 71, ALT of 131, lipase normal.  CT angio abdomen pelvis was performed to evaluate for mesenteric ischemia or other acute aortic abnormality, evaluate for bowel obstruction or other acute intra-abdominal abnormality which resulted reassuring: IMPRESSION:  1. Mild aortic atherosclerosis without aneurysm.   2. Mild narrowing of the celiac and superior mesenteric artery  origins, not likely flow limiting.  3. No evidence of bowel or mesenteric ischemia.    1. Non-vascular.  2. No acute finding.  3. Multiple simple appearing liver cysts.  4. Previous cholecystectomy and hysterectomy.  5. Previous decompression and fusion at L4-5. Ordinary degenerative  changes above and below that.    Aortic Atherosclerosis (ICD10-I70.0).    Patient was administered IV fluid bolus, IV Zofran, administered oral Bentyl.  She passed a p.o. challenge in the emergency department and was overall well-appearing on repeat assessment.  Symptoms are consistent with likely gastroenteritis with associated abdominal cramping.  No bloody diarrhea to suggest hemorrhagic colitis and no evidence of colitis on CT imaging.  Recommended that the patient continue oral rehydration at home, take Zofran for nausea and Bentyl for abdominal cramps and discomfort.  Overall advised PCP follow-up, stable for  discharge.  Final Clinical Impression(s) / ED Diagnoses Final diagnoses:  Gastroenteritis    Rx / DC Orders ED Discharge Orders          Ordered    dicyclomine (BENTYL) 20 MG tablet  2 times daily        11/07/22 1708    ondansetron (ZOFRAN-ODT) 4 MG disintegrating tablet  Every 8 hours PRN        11/07/22 1708              Regan Lemming, MD 11/07/22 1712

## 2022-11-07 NOTE — Discharge Instructions (Signed)
Your laboratory evaluation and CT imaging was reassuring.  Your symptoms are consistent with this and with likely gastroenteritis.  Recommend you continue to push fluids at home.  Will prescribe Zofran for nausea and Bentyl for abdominal cramping.

## 2022-11-08 ENCOUNTER — Encounter: Payer: Self-pay | Admitting: Nurse Practitioner

## 2022-11-08 ENCOUNTER — Telehealth: Payer: Self-pay | Admitting: Nurse Practitioner

## 2022-11-08 ENCOUNTER — Other Ambulatory Visit: Payer: Self-pay | Admitting: Nurse Practitioner

## 2022-11-08 DIAGNOSIS — R7989 Other specified abnormal findings of blood chemistry: Secondary | ICD-10-CM

## 2022-11-08 DIAGNOSIS — Z1211 Encounter for screening for malignant neoplasm of colon: Secondary | ICD-10-CM

## 2022-11-08 DIAGNOSIS — K7689 Other specified diseases of liver: Secondary | ICD-10-CM

## 2022-11-08 NOTE — Telephone Encounter (Signed)
Patient went to [facility] after her visit with you yesterday. They did and CT scan and labs. She would like to discuss her Liver function lab results with you when you get a chance, if you could please give her a call at [phone]

## 2022-11-13 ENCOUNTER — Ambulatory Visit (INDEPENDENT_AMBULATORY_CARE_PROVIDER_SITE_OTHER): Payer: Managed Care, Other (non HMO) | Admitting: Nurse Practitioner

## 2022-11-13 ENCOUNTER — Encounter: Payer: Self-pay | Admitting: Nurse Practitioner

## 2022-11-13 VITALS — BP 128/78 | HR 84 | Temp 97.5°F | Ht 66.0 in | Wt 154.0 lb

## 2022-11-13 DIAGNOSIS — R7989 Other specified abnormal findings of blood chemistry: Secondary | ICD-10-CM

## 2022-11-13 DIAGNOSIS — K2900 Acute gastritis without bleeding: Secondary | ICD-10-CM | POA: Diagnosis not present

## 2022-11-13 DIAGNOSIS — R112 Nausea with vomiting, unspecified: Secondary | ICD-10-CM

## 2022-11-13 DIAGNOSIS — K7689 Other specified diseases of liver: Secondary | ICD-10-CM

## 2022-11-13 DIAGNOSIS — R1010 Upper abdominal pain, unspecified: Secondary | ICD-10-CM

## 2022-11-13 DIAGNOSIS — Z79899 Other long term (current) drug therapy: Secondary | ICD-10-CM

## 2022-11-13 DIAGNOSIS — K219 Gastro-esophageal reflux disease without esophagitis: Secondary | ICD-10-CM

## 2022-11-13 MED ORDER — ONDANSETRON 4 MG PO TBDP: 4.0000 mg | ORAL_TABLET | Freq: Three times a day (TID) | ORAL | 0 refills | Status: DC | PRN

## 2022-11-13 MED ORDER — DICYCLOMINE HCL 20 MG PO TABS: 20.0000 mg | ORAL_TABLET | Freq: Two times a day (BID) | ORAL | 0 refills | Status: DC

## 2022-11-13 NOTE — Progress Notes (Signed)
Assessment and Plan:  Gabrielle Duncan was seen today for a follow up  Other acute gastritis without hemorrhage Continue f/u with Duncan 12/2022  - CBC with Differential/Platelet  Pain of upper abdomen Advanced diet as tolerated Stay well hydrated  - H. pylori breath test  Gastroesophageal reflux disease without esophagitis Continue TUMS/Famotidine Lifestyle modification:  wt loss, avoid meals 2-3h before bedtime. Consider eliminating food triggers:  chocolate, caffeine, EtOH, acid/spicy food.  Nausea and vomiting, unspecified vomiting type Advance diet as tolerated.  - H. pylori breath test  Medication management All medications discussed and reviewed in full. All questions and concerns regarding medications addressed.    - H. pylori breath test - CBC with Differential/Platelet - COMPLETE METABOLIC PANEL WITH GFR  Elevated LFTs/liver cysts Likely secondary to medications  - COMPLETE METABOLIC PANEL WITH GFR  Notify office for further evaluation and treatment, questions or concerns if s/s fail to improve after visit to ER. The risks and benefits of my recommendations, as well as other treatment options were discussed with the patient today. Questions were answered.  Further disposition pending results of labs. Discussed med's effects and SE's.    Over 20 minutes of exam, counseling, chart review, and critical decision making was performed.   Future Appointments  Date Time Provider [place]  05/04/2023  9:00 AM Lasalle Abee, Kenney Houseman, NP [facility] None    ------------------------------------------------------------------------------------------------------------------   HPI BP 128/78   Pulse 84   Temp (!) 97.5 F (36.4 C)   Ht 5' 6"$  (1.676 m)   Wt 154 lb (69.9 kg)   SpO2 99%   BMI 24.86 kg/m   67 y.o.female presents for a follow up and evaluation of continued upper abdominal pain.    She was seen in office 11/07/22 with onset 2 weeks prior of sharp shooting  pain in the mid abdomen that wrapped around to her back.  Within  24 hours she would vomit and have diarrhea.  The next day she had chick fila and vomited with diarrhea. Thought it was the stomach but or flu. She then went to eat toast and water and continued to have vomiting with diarrhea.  She tried to eat food with more substance but unable to hold down.  She is now weak and reports weight loss.  Feels dehydrated.  She is even unable to tolerate Tylenol.  She has stopped all of her medications, unable to hold down anything.  This includes Escitaplopram, Methotrexate, Rosuvastatin.    She is continuing to have stomach spasms, and feels as though the stomach continues to have painful waves of stomach pain that radiates around to back.  She is still not taking in foods or liquid.  Has dry mucus membranes.  Stomach now feels bloated without eating or drinking. No known hx of pancreatitis.  Has had cholecystectomy.  Denies fever, chills, URI symptoms. Review of last blood work 08/2022 WNL. She also had associated generalized rash rash that is now itching.    She was sent to the ER and dx with gastroenteritis.  LFTs were elevated.  CT abd/pelvis with/wo contrast was negative for bowel or mesenteric ischemia.  No acute findings.  She did have multiple appearing liver cysts.  She continues to have abdominal pain, N/V and bloating.  She has been taking Ondansetron and Dicyclomine PRN with effectiveness.  She is able to hold down very bland foods.  She is focusing on staying well hydrated.   She reports a scheduled f/t with Gabrielle Duncan 12/2022.  BMI is Body mass index is 24.86 kg/m.,  Wt Readings from Last 3 Encounters:  11/13/22 154 lb (69.9 kg)  11/07/22 152 lb (68.9 kg)  08/09/22 162 lb 6.4 oz (73.7 kg)    Past Medical History:  Diagnosis Date   Allergy    Left thyroid nodule 03/31/2013   Benign per thyroid biopsy in 2014   Mass of chest wall 12/2012   tender to the touch   Osteoarthritis     Psoriatic arthritis (HCC)    Seasonal allergies      No Known Allergies  Current Outpatient Medications on File Prior to Visit  Medication Sig   acetaminophen (TYLENOL) 500 MG tablet Take by mouth every 6 (six) hours as needed. (Patient not taking: Reported on 11/13/2022)   benzonatate (TESSALON) 200 MG capsule Take 1 cap three times a day as needed for cough. (Patient not taking: Reported on 11/13/2022)   escitalopram (LEXAPRO) 10 MG tablet TAKE 1 TABLET BY MOUTH DAILY FOR MOOD (Patient not taking: Reported on 11/13/2022)   famotidine (PEPCID) 20 MG tablet Take 1 tablet (20 mg total) by mouth 2 (two) times daily before a meal. For reflux.   folic acid (FOLVITE) 1 MG tablet Take 1 mg by mouth daily. (Patient not taking: Reported on 11/13/2022)   methotrexate (RHEUMATREX) 10 MG tablet Take 10 mg by mouth. Caution: Chemotherapy. Protect from light. Takes 12 tablets a week. (Patient not taking: Reported on 11/13/2022)   phentermine (ADIPEX-P) 37.5 MG tablet Take 1/2 to 1 tablet every morning as needed for dieting & weight loss (Patient not taking: Reported on 11/13/2022)   PROCTOSOL HC 2.5 % rectal cream PLACE 1 APPLICATION RECTALLY 2 (TWO) TIMES DAILY. (Patient not taking: Reported on 11/13/2022)   promethazine-dextromethorphan (PROMETHAZINE-DM) 6.25-15 MG/5ML syrup Take 5 mLs by mouth 4 (four) times daily as needed for cough. (Patient not taking: Reported on 11/13/2022)   rosuvastatin (CRESTOR) 10 MG tablet Take  1 tablet  Daily  for Cholesterol (Patient not taking: Reported on 11/13/2022)   ustekinumab (STELARA) 45 MG/0.5ML SOSY injection Inject 45 mg into the skin. Injects once every quarter (Patient not taking: Reported on 11/13/2022)   Vitamin D, Ergocalciferol, (DRISDOL) 1.25 MG (50000 UNIT) CAPS capsule TAKE 1 CAPSULE BY MOUTH 3X PER WEEK FOR VIT D DEFICIENCEY (Patient not taking: Reported on 11/13/2022)   No current facility-administered medications on file prior to visit.    ROS: all negative  except what is noted in the HPI.   Physical Exam:  BP 128/78   Pulse 84   Temp (!) 97.5 F (36.4 C)   Ht 5' 6"$  (1.676 m)   Wt 154 lb (69.9 kg)   SpO2 99%   BMI 24.86 kg/m   General Appearance: NAD.  Awake, conversant and cooperative. Eyes: PERRLA, EOMs intact.  Sclera white.  Conjunctiva without erythema. Sinuses: No frontal/maxillary tenderness.  No nasal discharge. Nares patent.  ENT/Mouth: Ext aud canals clear.  Bilateral TMs w/DOL and without erythema or bulging. Hearing intact.  Posterior pharynx without swelling or exudate.  Tonsils without swelling or erythema.  Neck: Supple.  No masses, nodules or thyromegaly. Respiratory: Effort is regular with non-labored breathing. Breath sounds are equal bilaterally without rales, rhonchi, wheezing or stridor.  Cardio: RRR with no MRGs. Brisk peripheral pulses without edema.  Abdomen: Hypoactive BS x4, tender to palpation RUQ, LUQ.  Marland Kitchen Lymphatics: Non tender without lymphadenopathy.  Musculoskeletal: Full ROM, 5/5 strength, normal ambulation.  No clubbing or cyanosis. Skin: Generalized maculopapular rash,  excoriation. Appropriate color for ethnicity. Warm. Neuro: CN II-XII grossly normal. Normal muscle tone without cerebellar symptoms and intact sensation.   Psych: AO X 3,  appropriate mood and affect, insight and judgment.    Darrol Jump, NP 9:37 AM [facility]

## 2022-11-13 NOTE — Patient Instructions (Signed)
Abdominal Pain, Adult Many things can cause belly (abdominal) pain. Most times, belly pain is not dangerous. Many cases of belly pain can be watched and treated at home. Sometimes, though, belly pain is serious. Your doctor will try to find the cause of your belly pain. Follow these instructions at home:  Medicines Take over-the-counter and prescription medicines only as told by your doctor. Do not take medicines that help you poop (laxatives) unless told by your doctor. General instructions Watch your belly pain for any changes. Drink enough fluid to keep your pee (urine) pale yellow. Keep all follow-up visits as told by your doctor. This is important. Contact a doctor if: Your belly pain changes or gets worse. You are not hungry, or you lose weight without trying. You are having trouble pooping (constipated) or have watery poop (diarrhea) for more than 2-3 days. You have pain when you pee or poop. Your belly pain wakes you up at night. Your pain gets worse with meals, after eating, or with certain foods. You are vomiting and cannot keep anything down. You have a fever. You have blood in your pee. Get help right away if: Your pain does not go away as soon as your doctor says it should. You cannot stop vomiting. Your pain is only in areas of your belly, such as the right side or the left lower part of the belly. You have bloody or black poop, or poop that looks like tar. You have very bad pain, cramping, or bloating in your belly. You have signs of not having enough fluid or water in your body (dehydration), such as: Dark pee, very little pee, or no pee. Cracked lips. Dry mouth. Sunken eyes. Sleepiness. Weakness. You have trouble breathing or chest pain. Summary Many cases of belly pain can be watched and treated at home. Watch your belly pain for any changes. Take over-the-counter and prescription medicines only as told by your doctor. Contact a doctor if your belly pain  changes or gets worse. Get help right away if you have very bad pain, cramping, or bloating in your belly. This information is not intended to replace advice given to you by your health care provider. Make sure you discuss any questions you have with your health care provider. Document Revised: 01/27/2019 Document Reviewed: 01/27/2019 Elsevier Patient Education  2023 [employer]  

## 2022-11-17 LAB — COMPLETE METABOLIC PANEL WITH GFR
AG Ratio: 1.6 (calc) (ref 1.0–2.5)
ALT: 23 U/L (ref 6–29)
AST: 14 U/L (ref 10–35)
Albumin: 3.7 g/dL (ref 3.6–5.1)
Alkaline phosphatase (APISO): 88 U/L (ref 37–153)
BUN: 11 mg/dL (ref 7–25)
CO2: 26 mmol/L (ref 20–32)
Calcium: 8.8 mg/dL (ref 8.6–10.4)
Chloride: 105 mmol/L (ref 98–110)
Creat: 0.69 mg/dL (ref 0.50–1.05)
Globulin: 2.3 g/dL (calc) (ref 1.9–3.7)
Glucose, Bld: 93 mg/dL (ref 65–99)
Potassium: 4 mmol/L (ref 3.5–5.3)
Sodium: 141 mmol/L (ref 135–146)
Total Bilirubin: 0.4 mg/dL (ref 0.2–1.2)
Total Protein: 6 g/dL — ABNORMAL LOW (ref 6.1–8.1)
eGFR: 94 mL/min/{1.73_m2} (ref >=60)

## 2022-11-17 LAB — CBC WITH DIFFERENTIAL/PLATELET
Absolute Monocytes: 431 cells/uL (ref 200–950)
Basophils Absolute: 46 cells/uL (ref 0–200)
Basophils Relative: 0.6 %
Eosinophils Absolute: 1455 cells/uL — ABNORMAL HIGH (ref 15–500)
Eosinophils Relative: 18.9 %
HCT: 37.9 % (ref 35.0–45.0)
Hemoglobin: 12.9 g/dL (ref 11.7–15.5)
Lymphs Abs: 2156 cells/uL (ref 850–3900)
MCH: 30.8 pg (ref 27.0–33.0)
MCHC: 34 g/dL (ref 32.0–36.0)
MCV: 90.5 fL (ref 80.0–100.0)
MPV: 11.1 fL (ref 7.5–12.5)
Monocytes Relative: 5.6 %
Neutro Abs: 3611 cells/uL (ref 1500–7800)
Neutrophils Relative %: 46.9 %
Platelets: 253 10*3/uL (ref 140–400)
RBC: 4.19 10*6/uL (ref 3.80–5.10)
RDW: 13 % (ref 11.0–15.0)
Total Lymphocyte: 28 %
WBC: 7.7 10*3/uL (ref 3.8–10.8)

## 2022-11-17 LAB — H. PYLORI BREATH TEST: H. pylori Breath Test: NOT DETECTED

## 2022-12-12 ENCOUNTER — Ambulatory Visit: Payer: Managed Care, Other (non HMO) | Admitting: Nurse Practitioner

## 2023-01-01 DIAGNOSIS — C801 Malignant (primary) neoplasm, unspecified: Secondary | ICD-10-CM

## 2023-01-01 DIAGNOSIS — C441291 Squamous cell carcinoma of skin of left upper eyelid, including canthus: Secondary | ICD-10-CM

## 2023-01-01 HISTORY — DX: Squamous cell carcinoma of skin of left upper eyelid, including canthus: C44.1291

## 2023-01-01 HISTORY — DX: Malignant (primary) neoplasm, unspecified: C80.1

## 2023-01-30 ENCOUNTER — Encounter: Payer: Managed Care, Other (non HMO) | Admitting: Nurse Practitioner

## 2023-02-06 LAB — HM COLONOSCOPY

## 2023-02-22 ENCOUNTER — Encounter: Payer: Managed Care, Other (non HMO) | Admitting: Nurse Practitioner

## 2023-02-26 ENCOUNTER — Encounter (HOSPITAL_COMMUNITY): Payer: Self-pay

## 2023-03-07 ENCOUNTER — Encounter: Payer: Self-pay | Admitting: Internal Medicine

## 2023-03-13 ENCOUNTER — Inpatient Hospital Stay: Payer: Managed Care, Other (non HMO) | Admitting: Nurse Practitioner

## 2023-03-15 ENCOUNTER — Inpatient Hospital Stay: Payer: Managed Care, Other (non HMO) | Admitting: Nurse Practitioner

## 2023-03-15 ENCOUNTER — Other Ambulatory Visit: Payer: Self-pay | Admitting: Nurse Practitioner

## 2023-03-15 ENCOUNTER — Telehealth: Payer: Self-pay | Admitting: Nurse Practitioner

## 2023-03-15 MED ORDER — ONDANSETRON 4 MG PO TBDP: 4.0000 mg | ORAL_TABLET | Freq: Three times a day (TID) | ORAL | 0 refills | Status: DC | PRN

## 2023-03-15 NOTE — Telephone Encounter (Signed)
Patient was scheduled with you today for a HFU due to hip fracture. She had to re-schedule for this coming Monday but she woke up this morning with vomiting and diarrhea. Will you call her in something to help with the Vomiting?

## 2023-03-19 ENCOUNTER — Encounter: Payer: Self-pay | Admitting: Nurse Practitioner

## 2023-03-19 ENCOUNTER — Ambulatory Visit (INDEPENDENT_AMBULATORY_CARE_PROVIDER_SITE_OTHER): Payer: Managed Care, Other (non HMO) | Admitting: Nurse Practitioner

## 2023-03-19 VITALS — BP 118/80 | HR 66 | Temp 97.5°F | Ht 66.0 in | Wt 157.8 lb

## 2023-03-19 DIAGNOSIS — Z79899 Other long term (current) drug therapy: Secondary | ICD-10-CM

## 2023-03-19 DIAGNOSIS — Z09 Encounter for follow-up examination after completed treatment for conditions other than malignant neoplasm: Secondary | ICD-10-CM

## 2023-03-19 DIAGNOSIS — M25552 Pain in left hip: Secondary | ICD-10-CM | POA: Diagnosis not present

## 2023-03-19 DIAGNOSIS — Z8781 Personal history of (healed) traumatic fracture: Secondary | ICD-10-CM

## 2023-03-19 DIAGNOSIS — Z7901 Long term (current) use of anticoagulants: Secondary | ICD-10-CM | POA: Diagnosis not present

## 2023-03-19 MED ORDER — GABAPENTIN 100 MG PO CAPS: 100.0000 mg | ORAL_CAPSULE | Freq: Three times a day (TID) | ORAL | 0 refills | Status: DC
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
## 2023-03-19 NOTE — Patient Instructions (Signed)

## 2023-03-20 LAB — COMPLETE METABOLIC PANEL WITH GFR
AG Ratio: 1.5 (calc) (ref 1.0–2.5)
ALT: 15 U/L (ref 6–29)
AST: 13 U/L (ref 10–35)
Albumin: 4 g/dL (ref 3.6–5.1)
Alkaline phosphatase (APISO): 91 U/L (ref 37–153)
BUN: 13 mg/dL (ref 7–25)
CO2: 28 mmol/L (ref 20–32)
Calcium: 10.1 mg/dL (ref 8.6–10.4)
Chloride: 104 mmol/L (ref 98–110)
Creat: 0.67 mg/dL (ref 0.50–1.05)
Globulin: 2.6 g/dL (calc) (ref 1.9–3.7)
Glucose, Bld: 94 mg/dL (ref 65–99)
Potassium: 4.8 mmol/L (ref 3.5–5.3)
Sodium: 140 mmol/L (ref 135–146)
Total Bilirubin: 0.4 mg/dL (ref 0.2–1.2)
Total Protein: 6.6 g/dL (ref 6.1–8.1)
eGFR: 95 mL/min/{1.73_m2} (ref >=60)

## 2023-03-20 LAB — CBC WITH DIFFERENTIAL/PLATELET
Absolute Monocytes: 433 cells/uL (ref 200–950)
Basophils Absolute: 31 cells/uL (ref 0–200)
Basophils Relative: 0.5 %
Eosinophils Absolute: 232 cells/uL (ref 15–500)
Eosinophils Relative: 3.8 %
HCT: 34.6 % — ABNORMAL LOW (ref 35.0–45.0)
Hemoglobin: 11.1 g/dL — ABNORMAL LOW (ref 11.7–15.5)
Lymphs Abs: 1671 cells/uL (ref 850–3900)
MCH: 29.1 pg (ref 27.0–33.0)
MCHC: 32.1 g/dL (ref 32.0–36.0)
MCV: 90.8 fL (ref 80.0–100.0)
MPV: 10.2 fL (ref 7.5–12.5)
Monocytes Relative: 7.1 %
Neutro Abs: 3733 cells/uL (ref 1500–7800)
Neutrophils Relative %: 61.2 %
Platelets: 421 10*3/uL — ABNORMAL HIGH (ref 140–400)
RBC: 3.81 10*6/uL (ref 3.80–5.10)
RDW: 13.2 % (ref 11.0–15.0)
Total Lymphocyte: 27.4 %
WBC: 6.1 10*3/uL (ref 3.8–10.8)

## 2023-03-20 LAB — PROTIME-INR
INR: 0.9
Prothrombin Time: 10.1 s (ref 9.0–11.5)

## 2023-05-04 ENCOUNTER — Ambulatory Visit (INDEPENDENT_AMBULATORY_CARE_PROVIDER_SITE_OTHER): Payer: Managed Care, Other (non HMO) | Admitting: Nurse Practitioner

## 2023-05-04 ENCOUNTER — Encounter: Payer: Self-pay | Admitting: Nurse Practitioner

## 2023-05-04 VITALS — BP 110/72 | HR 83 | Temp 97.5°F | Ht 63.25 in | Wt 148.0 lb

## 2023-05-04 DIAGNOSIS — F3341 Major depressive disorder, recurrent, in partial remission: Secondary | ICD-10-CM

## 2023-05-04 DIAGNOSIS — Z1389 Encounter for screening for other disorder: Secondary | ICD-10-CM

## 2023-05-04 DIAGNOSIS — Z1231 Encounter for screening mammogram for malignant neoplasm of breast: Secondary | ICD-10-CM

## 2023-05-04 DIAGNOSIS — L4050 Arthropathic psoriasis, unspecified: Secondary | ICD-10-CM

## 2023-05-04 DIAGNOSIS — Z79899 Other long term (current) drug therapy: Secondary | ICD-10-CM

## 2023-05-04 DIAGNOSIS — Z136 Encounter for screening for cardiovascular disorders: Secondary | ICD-10-CM

## 2023-05-04 DIAGNOSIS — K219 Gastro-esophageal reflux disease without esophagitis: Secondary | ICD-10-CM

## 2023-05-04 DIAGNOSIS — Z13220 Encounter for screening for lipoid disorders: Secondary | ICD-10-CM

## 2023-05-04 DIAGNOSIS — E538 Deficiency of other specified B group vitamins: Secondary | ICD-10-CM

## 2023-05-04 DIAGNOSIS — Z0000 Encounter for general adult medical examination without abnormal findings: Secondary | ICD-10-CM | POA: Diagnosis not present

## 2023-05-04 DIAGNOSIS — Z130 Encounter for screening for diseases of the blood and blood-forming organs and certain disorders involving the immune mechanism: Secondary | ICD-10-CM | POA: Diagnosis not present

## 2023-05-04 DIAGNOSIS — R7309 Other abnormal glucose: Secondary | ICD-10-CM

## 2023-05-04 DIAGNOSIS — T7840XS Allergy, unspecified, sequela: Secondary | ICD-10-CM

## 2023-05-04 DIAGNOSIS — H532 Diplopia: Secondary | ICD-10-CM

## 2023-05-04 DIAGNOSIS — K649 Unspecified hemorrhoids: Secondary | ICD-10-CM

## 2023-05-04 DIAGNOSIS — Z8781 Personal history of (healed) traumatic fracture: Secondary | ICD-10-CM

## 2023-05-04 DIAGNOSIS — Z131 Encounter for screening for diabetes mellitus: Secondary | ICD-10-CM

## 2023-05-04 DIAGNOSIS — E559 Vitamin D deficiency, unspecified: Secondary | ICD-10-CM

## 2023-05-04 DIAGNOSIS — I10 Essential (primary) hypertension: Secondary | ICD-10-CM | POA: Diagnosis not present

## 2023-05-04 DIAGNOSIS — Z0001 Encounter for general adult medical examination with abnormal findings: Secondary | ICD-10-CM

## 2023-05-04 DIAGNOSIS — E785 Hyperlipidemia, unspecified: Secondary | ICD-10-CM

## 2023-05-04 DIAGNOSIS — E663 Overweight: Secondary | ICD-10-CM

## 2023-05-04 DIAGNOSIS — M810 Age-related osteoporosis without current pathological fracture: Secondary | ICD-10-CM

## 2023-05-04 LAB — CBC WITH DIFFERENTIAL/PLATELET
Absolute Monocytes: 567 cells/uL (ref 200–950)
Basophils Absolute: 42 cells/uL (ref 0–200)
Basophils Relative: 0.6 %
Eosinophils Absolute: 287 cells/uL (ref 15–500)
Eosinophils Relative: 4.1 %
HCT: 32.5 % — ABNORMAL LOW (ref 35.0–45.0)
Hemoglobin: 10.5 g/dL — ABNORMAL LOW (ref 11.7–15.5)
Lymphs Abs: 1204 cells/uL (ref 850–3900)
MCH: 27.3 pg (ref 27.0–33.0)
MCHC: 32.3 g/dL (ref 32.0–36.0)
MCV: 84.4 fL (ref 80.0–100.0)
MPV: 10.3 fL (ref 7.5–12.5)
Monocytes Relative: 8.1 %
Neutro Abs: 4900 cells/uL (ref 1500–7800)
Neutrophils Relative %: 70 %
Platelets: 399 10*3/uL (ref 140–400)
RBC: 3.85 10*6/uL (ref 3.80–5.10)
RDW: 12.4 % (ref 11.0–15.0)
Total Lymphocyte: 17.2 %
WBC: 7 10*3/uL (ref 3.8–10.8)

## 2023-05-04 NOTE — Patient Instructions (Signed)

## 2023-05-04 NOTE — Progress Notes (Signed)
Complete Physical  Assessment and Plan:  Diagnoses and all orders for this visit:  Encounter for general adult medical examination with abnormal findings Due annually  Health maintenance reviewed Healthily lifestyle goals set  Hemorrhoids, unspecified hemorrhoid type No recent flare. Controlled Continue anusol PRN Follow up with GI for worsening symptoms  Essential hypertension Controlled  Continue medications;  Discussed DASH (Dietary Approaches to Stop Hypertension) DASH diet is lower in sodium than a typical American diet. Cut back on foods that are high in saturated fat, cholesterol, and trans fats. Eat more whole-grain foods, fish, poultry, and nuts Remain active and exercise as tolerated daily.  Monitor BP at home-Call if greater than 130/80.  Check CBC/CMP  Psoriatic arthritis (HCC) Continue DMARD No longer taking Stelara Follows with Dr. Dierdre Forth  She has f/u in 1 mo  Vitamin D deficiency Continue supplementation Check vitamin D level  Medication management All medications discussed and reviewed in full. All questions and concerns regarding medications addressed.     Hyperlipidemia, unspecified hyperlipidemia type Controlled Continue medications; Rosuvastatin Discussed lifestyle modifications. Recommended diet heavy in fruits and veggies, omega 3's. Decrease consumption of animal meats, cheeses, and dairy products. Remain active and exercise as tolerated. Continue to monitor. Check Lipids   Allergic state, sequela Avoid triggers. Continue OTC allergy pills  Screening for hematuria or proteinuria Check UA, microalbumin  B12 deficiency Continue B12 supplement Check levels  Overweight (BMI 25.0-29.9) Discussed appropriate BMI Diet modification. Physical activity. Encouraged/praised to build confidence. Start low dose Phentermine  Other abnormal glucose Education: Reviewed 'ABCs' of diabetes management  A1C (<7) Blood pressure  (<130/80) Cholesterol (LDL <70) Continue Eye Exam yearly  Continue Dental Exam Q6 mo Discussed dietary recommendations Discussed Physical Activity recommendations Check A1C  Recurrent major depressive disorder, in partial remission (HCC) Continue Lexapro Lifestyle discussed: diet/exerise, sleep hygiene, stress management, hydration  Diplopia/esotrophia with inferior oblique overaction Significant EOM abnormalities, MRI by opth was unremarkable;  [facility] - Continue to follow with [facility]   GERD Continue Pepcid No suspected reflux complications (Barret/stricture). Lifestyle modification:  wt loss, avoid meals 2-3h before bedtime. Consider eliminating food triggers:  chocolate, caffeine, EtOH, acid/spicy food.   S/p left hip fracture Ortho following Continue Walker/PT  Screening for breast cancer Mammogram ordered  Osteoporosis DEXA ordered Pursue a combination of weight-bearing exercises and strength training. Patients with severe mobility impairment should be referred for physical therapy. Advised on fall prevention measures including proper lighting in all rooms, removal of area rugs and floor clutter, use of walking devices as deemed appropriate, avoidance of uneven walking surfaces. Smoking cessation and moderate alcohol consumption if applicable Consume 800 to 1000 IU of vitamin D daily with a goal vitamin D serum value of 30 ng/mL or higher. Aim for 1000 to 1200 mg of elemental calcium daily through supplements and/or dietary sources.  Discussed further medication management pending blood work.  Orders Placed This Encounter  Procedures   MM Digital Screening    Standing Status:   Future    Standing Expiration Date:   05/03/2024    Order Specific Question:   Reason for Exam (SYMPTOM  OR DIAGNOSIS REQUIRED)    Answer:   Screening for Breast Cancer    Order Specific Question:   Preferred imaging location?    Answer:   [facility]   DG Bone  Density    Standing Status:   Future    Standing Expiration Date:   05/03/2024    Order Specific Question:  Reason for Exam (SYMPTOM  OR DIAGNOSIS REQUIRED)    Answer:   Osteoporosis    Order Specific Question:   Preferred imaging location?    Answer:   [facility]   CBC with Differential/Platelet   COMPLETE METABOLIC PANEL WITH GFR   Magnesium   Lipid panel   TSH   Hemoglobin A1c   Insulin, random   VITAMIN D 25 Hydroxy (Vit-D Deficiency, Fractures)   Urinalysis, Routine w reflex microscopic   Microalbumin / creatinine urine ratio   Vitamin B12   EKG 12-Lead   Notify office for further evaluation and treatment, questions or concerns if any reported s/s fail to improve.   The patient was advised to call back or seek an in-person evaluation if any symptoms worsen or if the condition fails to improve as anticipated.   Further disposition pending results of labs. Discussed med's effects and SE's.    I discussed the assessment and treatment plan with the patient. The patient was provided an opportunity to ask questions and all were answered. The patient agreed with the plan and demonstrated an understanding of the instructions.  Discussed med's effects and SE's. Screening labs and tests as requested with regular follow-up as recommended.  I provided 40 minutes of face-to-face time during this encounter including counseling, chart review, and critical decision making was preformed.  Today's Plan of Care is based on a patient-centered health care approach known as shared decision making - the decisions, tests and treatments allow for patient preferences and values to be balanced with clinical evidence.     Future Appointments  Date Time Provider Department Center  05/04/2023  9:00 AM Adela Glimpse, NP [facility] None  05/05/2024  9:00 AM Adela Glimpse, NP [facility] None     HPI  67 y.o. female  presents for a complete physical and follow up for has Hemorrhoids; Allergy;  Other abnormal glucose; Hyperlipidemia; Psoriatic arthritis (HCC); Essential hypertension; Vitamin D deficiency; Medication management; Recurrent major depressive disorder, in partial remission (HCC); Cervical arthritis; Overweight (BMI 25.0-29.9); Vertical diplopia; Binocular vision disorder with diplopia; Esotropia with inferior oblique overaction; Accommodative convergent strabismus; Osteoporosis; Atherosclerosis of aortic arch (HCC); and B12 deficiency on their problem list.   She is divorced, 2 kids, 1 passed due to pneumonia. Son is well, 1 grandchild, 8 y/o, has cochlear implants and doing well.   She shares with me that she has stopped all medications.  She is no longer wanting to take medications and is not receptive to restarting any medications at this time. She plans to follow up accordingly to discuss further POC.    She fell and broke her left hip over Labor Day Weekend 01/2023 [place] in [place].  She tried to run back up to her house [place].  She is now s/p left hip replacement following with orthopedics.  She has completed PT and only using a walker PRN.  Pain is controlled.     She has steady boyfriend of many years whom she lives with.   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 She is trying to retire soon. Very stressful job, however, still working, some from home as well  She follows with Dr. Dierdre Forth for psoriatic arthritis, on MTX.Stelara was recently stopped. Deferring retirement due to drug costs. She also follows with Dr. Marikay Alar with [facility] for her neck/back, and Dr. Dion Saucier for  her shoulder, just had L replaced Feb 18, 2020 and has done very well.   She was on wellbutrin 300 mg daily for major depression;  She is no longer taking this medication d/t diplopia. She presented in fall 2020 reporting persistent blurry vision, diplopia, had seen Dr. Dione Booze  who ordered MRI which was unremarkable, concern for MG though labs were negative; she was referred to neurology, and again to neuro/opth specialist at [facility] who felt high dose wellbutrin may have contributed, discontinued this but without significant improvement. Has ongoing double vision, has dry eye and watery discharge, improving with steroids, discussing possible surgical correction for eye lid weakness, will be seeing a new provider in [place] later this year - Dr. Zettie Cooley with [facility].     BMI is There is no height or weight on file to calculate BMI., she is working on diet and exercise, was down to 148 lb prior to covid 19 but has gained back due to admitted excessive snacking while at work. She does walk on work breaks, getting in average of 3 miles of walking daily.  Wt Readings from Last 3 Encounters:  03/19/23 157 lb 12.8 oz (71.6 kg)  11/13/22 154 lb (69.9 kg)  11/07/22 152 lb (68.9 kg)   Her blood pressure has been controlled at home, today their BP is   She does not workout. She denies chest pain, shortness of breath, dizziness.   She is on cholesterol medication rosuvastatin 5 mg daily and denies myalgias. Her cholesterol is not at goal. The cholesterol last visit was:   Lab Results  Component Value Date   CHOL 189 08/09/2022   HDL 56 08/09/2022   LDLCALC 112 (H) 08/09/2022   TRIG 103 08/09/2022   CHOLHDL 3.4 08/09/2022    Last A1C in the office was:  Lab Results  Component Value Date   HGBA1C 5.6 05/03/2022    She has hx of left thyroid nodule; underwent US and biopsy in 2014 which showed benign results. TSHs have been stable.  This year has had thinning hair, very dry skin, weight gain, fatigue; She discussed with Dr. Dierdre Forth and requests full thyroid panel, T3, T4 checked today  Lab Results  Component Value Date   TSH 1.33 05/03/2022   Last GFR: Lab Results  Component Value Date   GFRNONAA >60 11/07/2022   Patient is on Vitamin D supplement, taking 91478 3-4  days a week  Lab Results  Component Value Date   VD25OH 25 05/03/2022     She has hx of low B12, not on supplement, does want levels checked today Lab Results  Component Value Date   VITAMINB12 209 08/02/2021     Current Medications:  Current Outpatient Medications on File Prior to Visit  Medication Sig Dispense Refill   acetaminophen (TYLENOL) 500 MG tablet Take by mouth every 6 (six) hours as needed.     benzonatate (TESSALON) 200 MG capsule Take 1 cap three times a day as needed for cough. 30 capsule 1   dicyclomine (BENTYL) 20 MG tablet Take 1 tablet (20 mg total) by mouth 2 (two) times daily. 20 tablet 0   escitalopram (LEXAPRO) 10 MG tablet TAKE 1 TABLET BY MOUTH DAILY FOR MOOD 90 tablet 3   famotidine (PEPCID) 20 MG tablet Take 1 tablet (20 mg total) by mouth 2 (two) times daily before a meal. For reflux. 180 tablet 1   folic acid (FOLVITE) 1 MG tablet Take 1 mg by mouth daily.  gabapentin (NEURONTIN) 100 MG capsule Take 1 capsule (100 mg total) by mouth 3 (three) times daily. 90 capsule 0   methotrexate (RHEUMATREX) 10 MG tablet Take 10 mg by mouth. Caution: Chemotherapy. Protect from light. Takes 12 tablets a week.     ondansetron (ZOFRAN-ODT) 4 MG disintegrating tablet Take 1 tablet (4 mg total) by mouth every 8 (eight) hours as needed. 20 tablet 0   phentermine (ADIPEX-P) 37.5 MG tablet Take 1/2 to 1 tablet every morning as needed for dieting & weight loss 30 tablet 2   PROCTOSOL HC 2.5 % rectal cream PLACE 1 APPLICATION RECTALLY 2 (TWO) TIMES DAILY. 28.35 g 1   promethazine-dextromethorphan (PROMETHAZINE-DM) 6.25-15 MG/5ML syrup Take 5 mLs by mouth 4 (four) times daily as needed for cough. 240 mL 1   rosuvastatin (CRESTOR) 10 MG tablet Take  1 tablet  Daily  for Cholesterol 90 tablet 3   ustekinumab (STELARA) 45 MG/0.5ML SOSY injection Inject 45 mg into the skin. Injects once every quarter     Vitamin D, Ergocalciferol, (DRISDOL) 1.25 MG (50000 UNIT) CAPS capsule TAKE 1  CAPSULE BY MOUTH 3X PER WEEK FOR VIT D DEFICIENCEY 39 capsule 1   No current facility-administered medications on file prior to visit.   Allergies:  No Known Allergies Medical History:  She has Hemorrhoids; Allergy; Other abnormal glucose; Hyperlipidemia; Psoriatic arthritis (HCC); Essential hypertension; Vitamin D deficiency; Medication management; Recurrent major depressive disorder, in partial remission (HCC); Cervical arthritis; Overweight (BMI 25.0-29.9); Vertical diplopia; Binocular vision disorder with diplopia; Esotropia with inferior oblique overaction; Accommodative convergent strabismus; Osteoporosis; Atherosclerosis of aortic arch (HCC); and B12 deficiency on their problem list. Health Maintenance:   Immunization History  Administered Date(s) Administered   Influenza, High Dose Seasonal PF 06/04/2019, 08/16/2020, 08/02/2021, 08/09/2022   Moderna Sars-Covid-2 Vaccination 11/24/2019, 12/22/2019   PPD Test 05/07/2017   Pneumococcal Conjugate-13 06/04/2019   Pneumococcal Polysaccharide-23 08/16/2020   Tdap 12/09/2015   Tetanus: 2017 Pneumovax: Completed Prevnar 13: 2020 Flu vaccine: 2022,  Shingrix: will check with Dr. Dierdre Forth Covid 19: 2/2, 2021, moderna  LMP: 20+ years s/p TAH Pap: 2017  neg HPV, never abnormal, DONE MGM: 11/2019 Due DEXA: 11/2019, L fem T-2.8, has had fosamax in the past no longer taking - ordered  Colonoscopy: 2014 due 2024  Thyroid US/biopsy: 2014, benign  Last Dental Exam: q6 months, 2024 Last Eye Exam: [facility] (group with Dr. London Sheer), last visit 2024 Last Derm Exam: Dr. Terri Piedra, goes annually, last 2020, will schedule  Patient Care Team: Lucky Cowboy, MD as PCP - General (Internal Medicine) Graylin Shiver, MD as Consulting Physician (Gastroenterology) Donnetta Hail, MD as Consulting Physician (Rheumatology) Teryl Lucy, MD as Consulting Physician (Orthopedic Surgery) Marinell Blight, MD (Inactive) as Referring  Physician (Neurology)  Surgical History:  She has a past surgical history that includes Cholecystectomy (1997); Shoulder surgery (03/2012); Cervical fusion (2005); Anterior cervical decomp/discectomy fusion (08/08/2012); Total shoulder arthroplasty (09/24/2012); Eye surgery; Lumbar laminectomy/decompression microdiscectomy (01/01/2013); Lumbar fusion (01/01/2013); Abdominal hysterectomy (1997); Mass excision (N/A, 01/29/2013); Hemorrhoid surgery (N/A, 09/02/2013); Reduction mammaplasty (Bilateral); and Total shoulder arthroplasty (Left, 02/18/2020). Family History:  Herfamily history includes Asthma in her daughter; Breast cancer in her cousin, cousin, and maternal aunt; CAD (age of onset: 58) in her mother; Cancer in her paternal grandfather; Dementia in her mother; Diabetes in her father; Goiter in her maternal grandmother; Heart disease in her father; Lung cancer (age of onset: 23) in her maternal grandfather; Ovarian cancer (age of onset: 75) in  her paternal grandmother; Pancreatitis in her father; Thyroid cancer in her mother; Thyroid disease in her maternal grandmother and another family member. Social History:  She reports that she has never smoked. She has never used smokeless tobacco. She reports that she does not drink alcohol and does not use drugs.   Review of Systems: Review of Systems  Constitutional:  Positive for malaise/fatigue. Negative for weight loss.  HENT:  Negative for hearing loss and tinnitus.   Eyes:  Positive for double vision (intermittent, improving). Negative for blurred vision, photophobia, pain, discharge and redness.  Respiratory:  Negative for cough, sputum production, shortness of breath and wheezing.   Cardiovascular:  Negative for chest pain, palpitations, orthopnea, claudication, leg swelling and PND.  Gastrointestinal:  Positive for heartburn. Negative for abdominal pain, blood in stool, constipation, diarrhea, melena, nausea and vomiting.  Genitourinary: Negative.    Musculoskeletal:  Positive for joint pain. Negative for falls and myalgias.  Skin:  Negative for rash.  Neurological:  Negative for dizziness, tingling, tremors, sensory change, speech change, focal weakness, seizures, loss of consciousness, weakness and headaches.  Endo/Heme/Allergies:  Negative for polydipsia.  Psychiatric/Behavioral: Negative.  Negative for depression, memory loss, substance abuse and suicidal ideas. The patient is not nervous/anxious and does not have insomnia.   All other systems reviewed and are negative.   Physical Exam: Estimated body mass index is 25.47 kg/m as calculated from the following:   Height as of 03/19/23: 5\' 6"  (1.676 m).   Weight as of 03/19/23: 157 lb 12.8 oz (71.6 kg). There were no vitals taken for this visit. General Appearance: Well nourished, in no apparent distress.  Eyes: Pupils symmetrical but with significant difficulty focusing with erratic pupil reactions,EOM abnormal, bil interior oblique overreaction with strabismus R>L, conjunctiva no swelling or erythema  Sinuses: No Frontal/maxillary tenderness  ENT/Mouth: Ext aud canals clear, normal light reflex with TMs without erythema, bulging. Good dentition. No erythema, swelling, or exudate on post pharynx. Tonsils not swollen or erythematous. Hearing normal.  Neck: Supple, thyroid subtly enlarged, L > R, no distinct palpable nodules. No bruits  Respiratory: Respiratory effort normal, BS equal bilaterally without rales, rhonchi, wheezing or stridor.  Cardio: RRR without murmurs, rubs or gallops. Brisk peripheral pulses without edema.  Chest: symmetric, with normal excursions and percussion.  Breasts: Symmetric, without lumps, nipple discharge, retractions. Well healed reduction scars.  Abdomen: Soft, nontender, no guarding, rebound, hernias, masses, or organomegaly.  Lymphatics: Non tender without lymphadenopathy.  Genitourinary: Defer Musculoskeletal: Full ROM all peripheral extremities, 5/5  strength, and normal gait.  Skin: Warm, dry, without lesions, ecchymosis. psoriatic rash scattered - erythematous, scaly/flaky Neuro: Cranial nerves intact excepting III, IV, VI as noted by EOMs,  reflexes equal bilaterally. Normal muscle tone, no cerebellar symptoms. Sensation intact.  Psych: Awake and oriented X 3, pleasant, normal affect, Insight and Judgment appropriate.   EKG: WNL, no ST changes.  Keigan Tafoya 8:57 AM [facility]

## 2023-05-07 ENCOUNTER — Other Ambulatory Visit: Payer: Self-pay | Admitting: Nurse Practitioner

## 2023-05-07 ENCOUNTER — Telehealth: Payer: Self-pay | Admitting: Nurse Practitioner

## 2023-05-07 DIAGNOSIS — N3000 Acute cystitis without hematuria: Secondary | ICD-10-CM

## 2023-05-07 MED ORDER — NITROFURANTOIN MONOHYD MACRO 100 MG PO CAPS
100.0000 mg | ORAL_CAPSULE | Freq: Two times a day (BID) | ORAL | 0 refills | Status: AC
Start: 2023-05-07 — End: 2023-05-12

## 2023-05-07 NOTE — Telephone Encounter (Signed)
Pt is still feeling symptomatic and was wondering if you could send her in a script based off of her most recent lab results. Pls send to [facility] on file.

## 2023-06-07 ENCOUNTER — Ambulatory Visit
Admission: RE | Admit: 2023-06-07 | Discharge: 2023-06-07 | Disposition: A | Discharge status: Home / Self Care | Payer: Managed Care, Other (non HMO) | Source: Ambulatory Visit | Attending: Nurse Practitioner | Admitting: Nurse Practitioner

## 2023-06-07 DIAGNOSIS — Z1231 Encounter for screening mammogram for malignant neoplasm of breast: Secondary | ICD-10-CM

## 2023-07-19 ENCOUNTER — Other Ambulatory Visit: Payer: Self-pay | Admitting: Neurological Surgery

## 2023-07-20 ENCOUNTER — Telehealth: Payer: Self-pay | Admitting: Nurse Practitioner

## 2023-07-20 NOTE — Telephone Encounter (Signed)
Patient saw Dr. 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 The are doing surgery on Wednesday 07/23/2023. Patient would like to speak with you and make sure that you are getting all the progress notes.

## 2023-07-20 NOTE — Telephone Encounter (Signed)
Patient aware and is scheduled Wednesday for surgery.

## 2023-07-24 ENCOUNTER — Encounter (HOSPITAL_COMMUNITY): Payer: Self-pay | Admitting: Neurological Surgery

## 2023-07-24 ENCOUNTER — Other Ambulatory Visit: Payer: Self-pay

## 2023-07-24 NOTE — Plan of Care (Signed)
CHL Tonsillectomy/Adenoidectomy, Postoperative PEDS care plan entered in error.

## 2023-07-24 NOTE — Final Progress Note (Signed)
SDW CALL  Patient was given pre-op instructions over the phone. The opportunity was given for the patient to ask questions. No further questions asked. Patient verbalized understanding of instructions given.   PCP -Adela Glimpse NP Cardiologist - denies  PPM/ICD - denies Device Orders -  Rep Notified -   Chest x-ray - na EKG - 05/04/23 Stress Test - denies ECHO - denies Cardiac Cath - denies  Sleep Study - denies CPAP -   Fasting Blood Sugar - na Checks Blood Sugar _____ times a day  Blood Thinner Instructions:na Aspirin Instructions:na  ERAS Protcol - clear liquids until 0915  PRE-SURGERY Ensure or G2-   COVID TEST- na   Anesthesia review: no  Patient denies shortness of breath, fever, cough and chest pain over the phone call    Surgical Instructions    Your procedure is scheduled on October 23  Report to [facility] Main Entrance "A" at 0945am A.M., then check in with the Admitting office.  Call this number if you have problems the morning of surgery:  [phone]    Remember:  Do not eat after midnight the night before your surgery  You may drink clear liquids until 0915am the morning of your surgery.   Clear liquids allowed are: Water, Non-Citrus Juices (without pulp), Carbonated Beverages, Clear Tea, Black Coffee ONLY (NO MILK, CREAM OR POWDERED CREAMER of any kind), and Gatorade   Take these medicines the morning of surgery with A SIP OF WATER: none As of today, STOP taking any Aspirin (unless otherwise instructed by your surgeon) Aleve, Naproxen, Ibuprofen, Motrin, Advil, Goody's, BC's, all herbal medications, fish oil, and all vitamins.  [facility] is not responsible for any belongings or valuables. .   Do NOT Smoke (Tobacco/Vaping)  24 hours prior to your procedure  If you use a CPAP at night, you may bring your mask for your overnight stay.   Contacts, glasses, hearing aids, dentures or partials may not be worn into surgery, please bring cases  for these belongings   Patients discharged the day of surgery will not be allowed to drive home, and someone needs to stay with them for 24 hours.   Special instructions:    Oral Hygiene is also important to reduce your risk of infection.  Remember - BRUSH YOUR TEETH THE MORNING OF SURGERY WITH YOUR REGULAR TOOTHPASTE   Day of Surgery:  Take a shower the day of or night before with antibacterial soap. Wear Clean/Comfortable clothing the morning of surgery Do not apply any deodorants/lotions.   Do not wear jewelry or makeup Do not wear lotions, powders, perfumes/colognes, or deodorant. Do not shave 48 hours prior to surgery.  Men may shave face and neck. Do not bring valuables to the hospital. Do not wear nail polish, gel polish, artificial nails, or any other type of covering on natural nails (fingers and toes) If you have artificial nails or gel coating that need to be removed by a nail salon, please have this removed prior to surgery. Artificial nails or gel coating may interfere with anesthesia's ability to adequately monitor your vital signs. Remember to brush your teeth WITH YOUR REGULAR TOOTHPASTE.

## 2023-07-25 ENCOUNTER — Inpatient Hospital Stay (HOSPITAL_COMMUNITY)
Admission: RE | Disposition: A | Discharge status: Home / Self Care | Payer: Self-pay | Source: Home / Self Care | Attending: Neurological Surgery

## 2023-07-25 ENCOUNTER — Encounter (HOSPITAL_COMMUNITY): Payer: Self-pay | Admitting: Neurological Surgery

## 2023-07-25 ENCOUNTER — Inpatient Hospital Stay (HOSPITAL_COMMUNITY): Payer: Managed Care, Other (non HMO)

## 2023-07-25 ENCOUNTER — Other Ambulatory Visit: Payer: Self-pay

## 2023-07-25 ENCOUNTER — Inpatient Hospital Stay (HOSPITAL_COMMUNITY)
Admission: RE | Admit: 2023-07-25 | Discharge: 2023-07-28 | DRG: 430 | Disposition: A | Discharge status: Home / Self Care | Payer: Managed Care, Other (non HMO) | Attending: Neurological Surgery | Admitting: Neurological Surgery

## 2023-07-25 ENCOUNTER — Inpatient Hospital Stay (HOSPITAL_COMMUNITY): Payer: Managed Care, Other (non HMO) | Admitting: Anesthesiology

## 2023-07-25 ENCOUNTER — Observation Stay (HOSPITAL_COMMUNITY): Payer: Managed Care, Other (non HMO)

## 2023-07-25 ENCOUNTER — Inpatient Hospital Stay (HOSPITAL_BASED_OUTPATIENT_CLINIC_OR_DEPARTMENT_OTHER): Payer: Managed Care, Other (non HMO) | Admitting: Anesthesiology

## 2023-07-25 DIAGNOSIS — Z808 Family history of malignant neoplasm of other organs or systems: Secondary | ICD-10-CM

## 2023-07-25 DIAGNOSIS — Z981 Arthrodesis status: Principal | ICD-10-CM

## 2023-07-25 DIAGNOSIS — Z833 Family history of diabetes mellitus: Secondary | ICD-10-CM

## 2023-07-25 DIAGNOSIS — M5001 Cervical disc disorder with myelopathy,  high cervical region: Secondary | ICD-10-CM | POA: Diagnosis present

## 2023-07-25 DIAGNOSIS — M542 Cervicalgia: Secondary | ICD-10-CM | POA: Diagnosis not present

## 2023-07-25 DIAGNOSIS — M4802 Spinal stenosis, cervical region: Principal | ICD-10-CM | POA: Diagnosis present

## 2023-07-25 DIAGNOSIS — Z801 Family history of malignant neoplasm of trachea, bronchus and lung: Secondary | ICD-10-CM

## 2023-07-25 DIAGNOSIS — Z803 Family history of malignant neoplasm of breast: Secondary | ICD-10-CM

## 2023-07-25 DIAGNOSIS — Z818 Family history of other mental and behavioral disorders: Secondary | ICD-10-CM

## 2023-07-25 DIAGNOSIS — Z825 Family history of asthma and other chronic lower respiratory diseases: Secondary | ICD-10-CM

## 2023-07-25 DIAGNOSIS — Z8041 Family history of malignant neoplasm of ovary: Secondary | ICD-10-CM

## 2023-07-25 DIAGNOSIS — Z9049 Acquired absence of other specified parts of digestive tract: Secondary | ICD-10-CM

## 2023-07-25 DIAGNOSIS — M4712 Other spondylosis with myelopathy, cervical region: Secondary | ICD-10-CM | POA: Diagnosis present

## 2023-07-25 DIAGNOSIS — Z96612 Presence of left artificial shoulder joint: Secondary | ICD-10-CM | POA: Diagnosis present

## 2023-07-25 DIAGNOSIS — E042 Nontoxic multinodular goiter: Secondary | ICD-10-CM | POA: Diagnosis present

## 2023-07-25 DIAGNOSIS — Z90710 Acquired absence of both cervix and uterus: Secondary | ICD-10-CM

## 2023-07-25 DIAGNOSIS — Z8249 Family history of ischemic heart disease and other diseases of the circulatory system: Secondary | ICD-10-CM

## 2023-07-25 HISTORY — PX: POSTERIOR CERVICAL FUSION/FORAMINOTOMY: SHX5038

## 2023-07-25 HISTORY — PX: ANTERIOR CERVICAL DECOMP/DISCECTOMY FUSION: SHX1161

## 2023-07-25 LAB — BASIC METABOLIC PANEL
Anion gap: 10 (ref 5–15)
BUN: 11 mg/dL (ref 8–23)
CO2: 21 mmol/L — ABNORMAL LOW (ref 22–32)
Calcium: 9.3 mg/dL (ref 8.9–10.3)
Chloride: 107 mmol/L (ref 98–111)
Creatinine, Ser: 0.74 mg/dL (ref 0.44–1.00)
GFR, Estimated: >60 mL/min (ref >60)
Glucose, Bld: 103 mg/dL — ABNORMAL HIGH (ref 70–99)
Potassium: 3.1 mmol/L — ABNORMAL LOW (ref 3.5–5.1)
Sodium: 138 mmol/L (ref 135–145)

## 2023-07-25 LAB — PROTIME-INR
INR: 0.9 (ref 0.8–1.2)
Prothrombin Time: 12.7 s (ref 11.4–15.2)

## 2023-07-25 LAB — TYPE AND SCREEN
ABO/RH(D): O POS
Antibody Screen: NEGATIVE

## 2023-07-25 LAB — CBC
HCT: 38.5 % (ref 36.0–46.0)
Hemoglobin: 12.2 g/dL (ref 12.0–15.0)
MCH: 26.7 pg (ref 26.0–34.0)
MCHC: 31.7 g/dL (ref 30.0–36.0)
MCV: 84.2 fL (ref 80.0–100.0)
Platelets: 288 10*3/uL (ref 150–400)
RBC: 4.57 MIL/uL (ref 3.87–5.11)
RDW: 15.8 % — ABNORMAL HIGH (ref 11.5–15.5)
WBC: 5.6 10*3/uL (ref 4.0–10.5)
nRBC: 0 % (ref 0.0–0.2)

## 2023-07-25 LAB — SURGICAL PCR SCREEN
MRSA, PCR: NEGATIVE
Staphylococcus aureus: NEGATIVE

## 2023-07-25 SURGERY — ANTERIOR CERVICAL DECOMPRESSION/DISCECTOMY FUSION 1 LEVEL
Anesthesia: General | Site: Spine Cervical

## 2023-07-25 MED ORDER — FENTANYL CITRATE (PF) 250 MCG/5ML IJ SOLN
INTRAMUSCULAR | Status: AC
  Filled 2023-07-25: qty 5

## 2023-07-25 MED ORDER — GLYCOPYRROLATE PF 0.2 MG/ML IJ SOSY
PREFILLED_SYRINGE | INTRAMUSCULAR | Status: AC
  Filled 2023-07-25: qty 1

## 2023-07-25 MED ORDER — ONDANSETRON HCL 4 MG/2ML IJ SOLN
INTRAMUSCULAR | Status: DC | PRN
  Administered 2023-07-25: 4 mg via INTRAVENOUS

## 2023-07-25 MED ORDER — CEFAZOLIN SODIUM-DEXTROSE 2-4 GM/100ML-% IV SOLN
2.0000 g | INTRAVENOUS | Status: AC
  Administered 2023-07-25: 2 g via INTRAVENOUS

## 2023-07-25 MED ORDER — SENNA 8.6 MG PO TABS
1.0000 | ORAL_TABLET | Freq: Two times a day (BID) | ORAL | Status: DC
Start: 2023-07-25 — End: 2023-07-28
  Administered 2023-07-25 – 2023-07-27 (×5): 8.6 mg via ORAL
  Filled 2023-07-25 (×5): qty 1

## 2023-07-25 MED ORDER — LIDOCAINE 2% (20 MG/ML) 5 ML SYRINGE
INTRAMUSCULAR | Status: DC | PRN
  Administered 2023-07-25: 50 mg via INTRAVENOUS

## 2023-07-25 MED ORDER — DEXAMETHASONE 4 MG PO TABS
4.0000 mg | ORAL_TABLET | Freq: Four times a day (QID) | ORAL | Status: DC
  Administered 2023-07-25 – 2023-07-27 (×6): 4 mg via ORAL
  Filled 2023-07-25 (×6): qty 1

## 2023-07-25 MED ORDER — DEXAMETHASONE SODIUM PHOSPHATE 10 MG/ML IJ SOLN
INTRAMUSCULAR | Status: DC | PRN
  Administered 2023-07-25: 10 mg via INTRAVENOUS

## 2023-07-25 MED ORDER — CHLORHEXIDINE GLUCONATE CLOTH 2 % EX PADS: 6.0000 | MEDICATED_PAD | Freq: Once | CUTANEOUS | Status: DC

## 2023-07-25 MED ORDER — 0.9 % SODIUM CHLORIDE (POUR BTL) OPTIME
TOPICAL | Status: DC | PRN
  Administered 2023-07-25: 1000 mL

## 2023-07-25 MED ORDER — CHLORHEXIDINE GLUCONATE 0.12 % MT SOLN: 15.0000 mL | Freq: Once | OROMUCOSAL | Status: AC

## 2023-07-25 MED ORDER — DEXAMETHASONE SODIUM PHOSPHATE 4 MG/ML IJ SOLN
4.0000 mg | Freq: Four times a day (QID) | INTRAMUSCULAR | Status: DC
  Administered 2023-07-25: 4 mg via INTRAVENOUS
  Filled 2023-07-25: qty 1

## 2023-07-25 MED ORDER — OXYCODONE HCL 5 MG PO TABS
5.0000 mg | ORAL_TABLET | ORAL | Status: DC | PRN
  Administered 2023-07-25 – 2023-07-28 (×15): 5 mg via ORAL
  Filled 2023-07-25 (×15): qty 1

## 2023-07-25 MED ORDER — GLYCOPYRROLATE 0.2 MG/ML IJ SOLN
INTRAMUSCULAR | Status: DC | PRN
  Administered 2023-07-25: .2 mg via INTRAVENOUS

## 2023-07-25 MED ORDER — ONDANSETRON HCL 4 MG/2ML IJ SOLN
INTRAMUSCULAR | Status: AC
  Filled 2023-07-25: qty 2

## 2023-07-25 MED ORDER — PROPOFOL 10 MG/ML IV BOLUS
INTRAVENOUS | Status: DC | PRN
  Administered 2023-07-25: 120 mg via INTRAVENOUS

## 2023-07-25 MED ORDER — ORAL CARE MOUTH RINSE: 15.0000 mL | Freq: Once | OROMUCOSAL | Status: AC

## 2023-07-25 MED ORDER — CEFAZOLIN SODIUM-DEXTROSE 2-4 GM/100ML-% IV SOLN
2.0000 g | Freq: Three times a day (TID) | INTRAVENOUS | Status: AC
Start: 2023-07-25 — End: 2023-07-26
  Administered 2023-07-25 – 2023-07-26 (×2): 2 g via INTRAVENOUS
  Filled 2023-07-25 (×2): qty 100

## 2023-07-25 MED ORDER — CEFAZOLIN SODIUM-DEXTROSE 2-4 GM/100ML-% IV SOLN
INTRAVENOUS | Status: AC
  Filled 2023-07-25: qty 100

## 2023-07-25 MED ORDER — BACITRACIN ZINC 500 UNIT/GM EX OINT
TOPICAL_OINTMENT | CUTANEOUS | Status: AC
  Filled 2023-07-25: qty 28.35

## 2023-07-25 MED ORDER — EPHEDRINE 5 MG/ML INJ
INTRAVENOUS | Status: AC
  Filled 2023-07-25: qty 5

## 2023-07-25 MED ORDER — IOHEXOL 12 MG/ML PO SOLN
500.0000 mL | ORAL | Status: AC
  Administered 2023-07-25 (×2): 500 mL via ORAL

## 2023-07-25 MED ORDER — MORPHINE SULFATE (PF) 2 MG/ML IV SOLN: 2.0000 mg | INTRAVENOUS | Status: DC | PRN

## 2023-07-25 MED ORDER — SURGIRINSE WOUND IRRIGATION SYSTEM - OPTIME: TOPICAL | Status: DC | PRN

## 2023-07-25 MED ORDER — ROCURONIUM BROMIDE 10 MG/ML (PF) SYRINGE
PREFILLED_SYRINGE | INTRAVENOUS | Status: AC
  Filled 2023-07-25: qty 10

## 2023-07-25 MED ORDER — BACITRACIN ZINC 500 UNIT/GM EX OINT
TOPICAL_OINTMENT | CUTANEOUS | Status: DC | PRN
  Administered 2023-07-25: 1 via TOPICAL

## 2023-07-25 MED ORDER — ROCURONIUM BROMIDE 10 MG/ML (PF) SYRINGE
PREFILLED_SYRINGE | INTRAVENOUS | Status: DC | PRN
  Administered 2023-07-25: 20 mg via INTRAVENOUS
  Administered 2023-07-25: 10 mg via INTRAVENOUS
  Administered 2023-07-25: 20 mg via INTRAVENOUS
  Administered 2023-07-25: 50 mg via INTRAVENOUS

## 2023-07-25 MED ORDER — DEXAMETHASONE SODIUM PHOSPHATE 10 MG/ML IJ SOLN
INTRAMUSCULAR | Status: AC
  Filled 2023-07-25: qty 1

## 2023-07-25 MED ORDER — CHLORHEXIDINE GLUCONATE 0.12 % MT SOLN
OROMUCOSAL | Status: AC
  Administered 2023-07-25: 15 mL via OROMUCOSAL
  Filled 2023-07-25: qty 15

## 2023-07-25 MED ORDER — THROMBIN 5000 UNITS EX SOLR
CUTANEOUS | Status: AC
  Filled 2023-07-25: qty 10000

## 2023-07-25 MED ORDER — FENTANYL CITRATE (PF) 100 MCG/2ML IJ SOLN
INTRAMUSCULAR | Status: AC
  Filled 2023-07-25: qty 2

## 2023-07-25 MED ORDER — ONDANSETRON HCL 4 MG PO TABS
4.0000 mg | ORAL_TABLET | Freq: Four times a day (QID) | ORAL | Status: DC | PRN
Start: 2023-07-25 — End: 2023-07-28
  Administered 2023-07-25: 4 mg via ORAL
  Filled 2023-07-25: qty 1

## 2023-07-25 MED ORDER — LACTATED RINGERS IV SOLN: INTRAVENOUS | Status: DC

## 2023-07-25 MED ORDER — PHENYLEPHRINE HCL-NACL 20-0.9 MG/250ML-% IV SOLN
INTRAVENOUS | Status: DC | PRN
  Administered 2023-07-25: 80 ug via INTRAVENOUS
  Administered 2023-07-25: 30 ug/min via INTRAVENOUS

## 2023-07-25 MED ORDER — VANCOMYCIN HCL 1000 MG IV SOLR
INTRAVENOUS | Status: AC
  Filled 2023-07-25: qty 20

## 2023-07-25 MED ORDER — GABAPENTIN 300 MG PO CAPS
ORAL_CAPSULE | ORAL | Status: AC
  Administered 2023-07-25: 300 mg via ORAL
  Filled 2023-07-25: qty 1

## 2023-07-25 MED ORDER — PROPOFOL 500 MG/50ML IV EMUL
INTRAVENOUS | Status: DC | PRN
  Administered 2023-07-25: 100 ug/kg/min via INTRAVENOUS
  Administered 2023-07-25: 50 ug via INTRAVENOUS

## 2023-07-25 MED ORDER — ONDANSETRON HCL 4 MG/2ML IJ SOLN
4.0000 mg | Freq: Four times a day (QID) | INTRAMUSCULAR | Status: DC | PRN
Start: 2023-07-25 — End: 2023-07-28

## 2023-07-25 MED ORDER — LIDOCAINE 2% (20 MG/ML) 5 ML SYRINGE
INTRAMUSCULAR | Status: AC
  Filled 2023-07-25: qty 5

## 2023-07-25 MED ORDER — SODIUM CHLORIDE 0.9% FLUSH
3.0000 mL | Freq: Two times a day (BID) | INTRAVENOUS | Status: DC
Start: 2023-07-25 — End: 2023-07-28
  Administered 2023-07-26: 3 mL via INTRAVENOUS

## 2023-07-25 MED ORDER — SODIUM CHLORIDE 0.9% FLUSH
10.0000 mL | Freq: Two times a day (BID) | INTRAVENOUS | Status: DC
Start: 2023-07-25 — End: 2023-07-28
  Administered 2023-07-26: 10 mL via INTRAVENOUS

## 2023-07-25 MED ORDER — METHOCARBAMOL 1000 MG/10ML IJ SOLN: 500.0000 mg | Freq: Four times a day (QID) | INTRAMUSCULAR | Status: DC | PRN

## 2023-07-25 MED ORDER — IOHEXOL 350 MG/ML SOLN
75.0000 mL | Freq: Once | INTRAVENOUS | Status: AC | PRN
  Administered 2023-07-25: 75 mL via INTRAVENOUS

## 2023-07-25 MED ORDER — KCL IN DEXTROSE-NACL 10-5-0.45 MEQ/L-%-% IV SOLN
INTRAVENOUS | Status: AC
  Filled 2023-07-25: qty 1000

## 2023-07-25 MED ORDER — SODIUM CHLORIDE 0.9% FLUSH: 3.0000 mL | INTRAVENOUS | Status: DC | PRN

## 2023-07-25 MED ORDER — GABAPENTIN 300 MG PO CAPS: 300.0000 mg | ORAL_CAPSULE | ORAL | Status: AC

## 2023-07-25 MED ORDER — BUPIVACAINE HCL (PF) 0.25 % IJ SOLN
INTRAMUSCULAR | Status: AC
  Filled 2023-07-25: qty 30

## 2023-07-25 MED ORDER — ACETAMINOPHEN 500 MG PO TABS: 1000.0000 mg | ORAL_TABLET | ORAL | Status: AC

## 2023-07-25 MED ORDER — THROMBIN (RECOMBINANT) 5000 UNITS EX SOLR: CUTANEOUS | Status: DC | PRN

## 2023-07-25 MED ORDER — BUPIVACAINE HCL (PF) 0.25 % IJ SOLN
INTRAMUSCULAR | Status: DC | PRN
  Administered 2023-07-25: 7 mL

## 2023-07-25 MED ORDER — BUPIVACAINE HCL (PF) 0.25 % IJ SOLN
INTRAMUSCULAR | Status: DC | PRN
  Administered 2023-07-25: 3 mL

## 2023-07-25 MED ORDER — ACETAMINOPHEN 500 MG PO TABS
ORAL_TABLET | ORAL | Status: AC
  Administered 2023-07-25: 1000 mg via ORAL
  Filled 2023-07-25: qty 2

## 2023-07-25 MED ORDER — THROMBIN 5000 UNITS EX SOLR
CUTANEOUS | Status: AC
  Filled 2023-07-25: qty 5000

## 2023-07-25 MED ORDER — EPHEDRINE SULFATE (PRESSORS) 50 MG/ML IJ SOLN
INTRAMUSCULAR | Status: DC | PRN
  Administered 2023-07-25: 10 mg via INTRAVENOUS

## 2023-07-25 MED ORDER — MENTHOL 3 MG MT LOZG
1.0000 | LOZENGE | OROMUCOSAL | Status: DC | PRN
  Filled 2023-07-25: qty 9

## 2023-07-25 MED ORDER — THROMBIN 5000 UNITS EX SOLR: OROMUCOSAL | Status: DC | PRN

## 2023-07-25 MED ORDER — METHOCARBAMOL 500 MG PO TABS
500.0000 mg | ORAL_TABLET | Freq: Four times a day (QID) | ORAL | Status: DC | PRN
  Administered 2023-07-25 – 2023-07-28 (×7): 500 mg via ORAL
  Filled 2023-07-25 (×7): qty 1

## 2023-07-25 MED ORDER — PHENOL 1.4 % MT LIQD: 1.0000 | OROMUCOSAL | Status: DC | PRN

## 2023-07-25 MED ORDER — FENTANYL CITRATE (PF) 100 MCG/2ML IJ SOLN
25.0000 ug | INTRAMUSCULAR | Status: DC | PRN
  Administered 2023-07-25: 25 ug via INTRAVENOUS
  Administered 2023-07-25: 50 ug via INTRAVENOUS

## 2023-07-25 MED ORDER — ACETAMINOPHEN 500 MG PO TABS
1000.0000 mg | ORAL_TABLET | Freq: Four times a day (QID) | ORAL | Status: AC
  Administered 2023-07-25 – 2023-07-26 (×4): 1000 mg via ORAL
  Filled 2023-07-25 (×4): qty 2

## 2023-07-25 MED ORDER — FENTANYL CITRATE (PF) 250 MCG/5ML IJ SOLN
INTRAMUSCULAR | Status: DC | PRN
  Administered 2023-07-25 (×3): 50 ug via INTRAVENOUS

## 2023-07-25 MED ORDER — PROPOFOL 10 MG/ML IV BOLUS
INTRAVENOUS | Status: AC
  Filled 2023-07-25: qty 20

## 2023-07-25 SURGICAL SUPPLY — 64 items
APL SKNCLS STERI-STRIP NONHPOA (GAUZE/BANDAGES/DRESSINGS) ×2
BAG COUNTER SPONGE SURGICOUNT (BAG) ×2 IMPLANT
BAG SPNG CNTER NS LX DISP (BAG) ×2
BASKET BONE COLLECTION (BASKET) ×1 IMPLANT
BENZOIN TINCTURE PRP APPL 2/3 (GAUZE/BANDAGES/DRESSINGS) ×2 IMPLANT
BIT DRILL 2.3 STRL 12 (BIT) IMPLANT
BIT DRILL INVICTUS SUB 2.1 STR (BIT) IMPLANT
BLADE CLIPPER SURG (BLADE) IMPLANT
BUR CARBIDE MATCH 3.0 (BURR) ×1 IMPLANT
CANISTER SUCT 3000ML PPV (MISCELLANEOUS) ×2 IMPLANT
DRAPE C-ARM 42X72 X-RAY (DRAPES) ×4 IMPLANT
DRAPE LAPAROTOMY 100X72 PEDS (DRAPES) ×2 IMPLANT
DRAPE MICROSCOPE SLANT 54X150 (MISCELLANEOUS) IMPLANT
DRSG TELFA 3X8 NADH STRL (GAUZE/BANDAGES/DRESSINGS) IMPLANT
DURAPREP 26ML APPLICATOR (WOUND CARE) ×1 IMPLANT
DURAPREP 6ML APPLICATOR 50/CS (WOUND CARE) ×1 IMPLANT
ELECT COATED BLADE 2.86 ST (ELECTRODE) ×1 IMPLANT
ELECT REM PT RETURN 9FT ADLT (ELECTROSURGICAL) ×2
ELECTRODE REM PT RTRN 9FT ADLT (ELECTROSURGICAL) ×2 IMPLANT
EVACUATOR 1/8 PVC DRAIN (DRAIN) IMPLANT
GAUZE 4X4 16PLY ~~LOC~~+RFID DBL (SPONGE) IMPLANT
GLOVE BIO SURGEON STRL SZ7 (GLOVE) IMPLANT
GLOVE BIO SURGEON STRL SZ8 (GLOVE) ×2 IMPLANT
GLOVE BIOGEL PI IND STRL 7.0 (GLOVE) IMPLANT
GOWN STRL REUS W/ TWL LRG LVL3 (GOWN DISPOSABLE) IMPLANT
GOWN STRL REUS W/ TWL XL LVL3 (GOWN DISPOSABLE) ×1 IMPLANT
GOWN STRL REUS W/TWL 2XL LVL3 (GOWN DISPOSABLE) ×1 IMPLANT
GOWN STRL REUS W/TWL LRG LVL3 (GOWN DISPOSABLE)
GOWN STRL REUS W/TWL XL LVL3 (GOWN DISPOSABLE) ×1
HEMOSTAT POWDER KIT SURGIFOAM (HEMOSTASIS) ×1 IMPLANT
KIT BASIN OR (CUSTOM PROCEDURE TRAY) ×2 IMPLANT
KIT TURNOVER KIT B (KITS) ×2 IMPLANT
MARKER SKIN DUAL TIP RULER LAB (MISCELLANEOUS) ×1 IMPLANT
NDL HYPO 25X1 1.5 SAFETY (NEEDLE) ×2 IMPLANT
NDL SPNL 20GX3.5 QUINCKE YW (NEEDLE) ×1 IMPLANT
NEEDLE HYPO 25X1 1.5 SAFETY (NEEDLE) ×2 IMPLANT
NEEDLE SPNL 20GX3.5 QUINCKE YW (NEEDLE) ×1 IMPLANT
NS IRRIG 1000ML POUR BTL (IV SOLUTION) ×2 IMPLANT
PACK LAMINECTOMY NEURO (CUSTOM PROCEDURE TRAY) ×2 IMPLANT
PAD ARMBOARD 7.5X6 YLW CONV (MISCELLANEOUS) ×2 IMPLANT
PIN DISTRACTION 14MM (PIN) ×2 IMPLANT
PIN MAYFIELD SKULL DISP (PIN) ×1 IMPLANT
PLATE ACP INSIG 21 1L (Plate) IMPLANT
PUTTY DBM 5CC (Putty) IMPLANT
ROD LORD INVICT 3.5X35 NS (Rod) IMPLANT
SCREW PA 3.5X12 (Screw) IMPLANT
SCREW POLYAXIAL 3.5 X 14 (Screw) IMPLANT
SCREW SET ATEC (Screw) IMPLANT
SCREW VA SINGLE LEAD 4X14 (Screw) ×4 IMPLANT
SCREW VA SINGLE LEAD 4X14 ST (Screw) IMPLANT
SOLUTION IRRIG SURGIPHOR (IV SOLUTION) IMPLANT
SPACER ASSEM CERV LORD 7M (Spacer) IMPLANT
SPONGE INTESTINAL PEANUT (DISPOSABLE) ×1 IMPLANT
SPONGE SURGIFOAM ABS GEL SZ50 (HEMOSTASIS) ×1 IMPLANT
SPONGE T-LAP 4X18 ~~LOC~~+RFID (SPONGE) IMPLANT
STRIP CLOSURE SKIN 1/2X4 (GAUZE/BANDAGES/DRESSINGS) ×2 IMPLANT
SUT VIC AB 0 CT1 18XCR BRD8 (SUTURE) ×1 IMPLANT
SUT VIC AB 0 CT1 8-18 (SUTURE) ×1
SUT VIC AB 2-0 CP2 18 (SUTURE) ×1 IMPLANT
SUT VIC AB 3-0 SH 8-18 (SUTURE) ×2 IMPLANT
SUT VICRYL 4-0 PS2 18IN ABS (SUTURE) IMPLANT
TOWEL GREEN STERILE (TOWEL DISPOSABLE) ×2 IMPLANT
TOWEL GREEN STERILE FF (TOWEL DISPOSABLE) ×2 IMPLANT
WATER STERILE IRR 1000ML POUR (IV SOLUTION) ×2 IMPLANT

## 2023-07-25 NOTE — Anesthesia Preprocedure Evaluation (Addendum)
Anesthesia Evaluation  Patient identified by MRN, date of birth, ID band Patient awake    Reviewed: Allergy & Precautions, NPO status , Patient's Chart, lab work & pertinent test results  Airway Mallampati: I  TM Distance: >3 FB Neck ROM: Full    Dental no notable dental hx. (+) Teeth Intact, Dental Advisory Given   Pulmonary neg pulmonary ROS   Pulmonary exam normal breath sounds clear to auscultation       Cardiovascular hypertension, Normal cardiovascular exam Rhythm:Regular Rate:Normal     Neuro/Psych  PSYCHIATRIC DISORDERS  Depression    negative neurological ROS     GI/Hepatic negative GI ROS, Neg liver ROS,,,  Endo/Other  negative endocrine ROS    Renal/GU negative Renal ROS  negative genitourinary   Musculoskeletal  (+) Arthritis ,    Abdominal   Peds  Hematology negative hematology ROS (+)   Anesthesia Other Findings   Reproductive/Obstetrics                             Anesthesia Physical Anesthesia Plan  ASA: 2  Anesthesia Plan: General   Post-op Pain Management: Tylenol PO (pre-op)*   Induction: Intravenous  PONV Risk Score and Plan: 3 and Midazolam, Dexamethasone and Ondansetron  Airway Management Planned: Oral ETT  Additional Equipment:   Intra-op Plan:   Post-operative Plan: Extubation in OR  Informed Consent: I have reviewed the patients History and Physical, chart, labs and discussed the procedure including the risks, benefits and alternatives for the proposed anesthesia with the patient or authorized representative who has indicated his/her understanding and acceptance.     Dental advisory given  Plan Discussed with: CRNA  Anesthesia Plan Comments:        Anesthesia Quick Evaluation

## 2023-07-25 NOTE — Progress Notes (Signed)
Orthopedic Tech Progress Note Patient Details:  Gabrielle Duncan [birth year] [id number]  Ortho Devices Type of Ortho Device: Soft collar Ortho Device/Splint Interventions: Application, Adjustment, Ordered   Post Interventions Patient Tolerated: Well  Deara Bober OTR/L 07/25/2023, 4:46 PM

## 2023-07-25 NOTE — Op Note (Signed)
07/25/2023  4:17 PM  PATIENT:  Gabrielle Duncan  67 y.o. 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 Decompressive anterior cervical discectomy C3-4, 2. Anterior cervical arthrodesis C3-4 utilizing a 8 mm structural allograft, 3. Anterior cervical plating C3-4 utilizing a ATEC plate, 4.  Posterior cervical decompressive laminectomy C3-4, 5.  Posterior cervical arthrodesis with lateral mass instrumentation C3-C5 utilizing DBM putty, 6.  Nonsegmental fixation C3-C5 utilizing ATEC lateral mass screws  SURGEON:  Marikay Alar, MD  ASSISTANTS: Gabrielle Dike, FNP  ANESTHESIA:   General  EBL: 20 ml  Total I/O In: 100 [IV Piggyback:100] Out: 340 [Urine:320; Blood:20]  BLOOD ADMINISTERED: none  DRAINS: none  SPECIMEN: Sent all removed spinous process, lamina, epidural ligament, and epidural gelatinous tissue for frozen and permanent pathology  INDICATION FOR PROCEDURE: This patient presented with neck pain and weakness in her arms with progressive numbness and tingling in her hands and difficulty with gait. Imaging showed significant spinal stenosis at C3-4 with subluxation and abnormal signal within the posterior elements at C3 with epidural enhancing process C3-4. The patient tried conservative measures without relief. Pain was debilitating. Recommended ACDF with plating. Patient understood the risks, benefits, and alternatives and potential outcomes and wished to proceed.  PROCEDURE DETAILS: Patient was brought to the operating room placed under general endotracheal anesthesia. Patient was placed in the supine position on the operating room table. The neck was prepped with Duraprep and draped in a sterile fashion.   Three cc of local anesthesia was injected and a  transverse incision was made on the right side of the neck.  Dissection was carried down thru the subcutaneous tissue and the platysma was  elevated, opened, and undermined with Metzenbaum scissors.  Dissection was then carried out thru an avascular plane leaving the sternocleidomastoid carotid artery and jugular vein laterally and the trachea and esophagus medially with the assistance of my nurse practitioner. The ventral aspect of the vertebral column was identified and a localizing x-ray was taken. The C3-4 level was identified and all in the room agreed with the level. The longus colli muscles were then elevated and the retractor was placed with the assistance of my nurse practitioner. The annulus was incised and the disc space entered. Discectomy was performed with micro-curettes and pituitary rongeurs. I then used the high-speed drill to drill the endplates down to the level of the posterior longitudinal ligament. The drill shavings were saved in a mucous trap for later arthrodesis. The operating microscope was draped and brought into the field provided additional magnification, illumination and visualization. Discectomy was continued posteriorly thru the disc space. Posterior longitudinal ligament was opened with a nerve hook, and then removed along with disc herniation and osteophytes, decompressing the spinal canal and thecal sac. We then continued to remove osteophytic overgrowth and disc material decompressing the neural foramina and exiting nerve roots bilaterally. The scope was angled up and down to help decompress and undercut the vertebral bodies. Once the decompression was completed we could pass a nerve hook circumferentially to assure adequate decompression in the midline and in the neural foramina. So by both visualization and palpation we felt we had an adequate decompression of the neural elements. We then measured the height of the intravertebral disc space and selected a 8 millimeter  structural allograft. It was then gently positioned in the intravertebral  disc space(s) and countersunk. I then used a 22 mm ATEC plate and placed 14 mm variable angle screws into the vertebral bodies of each level and locked them into position. The wound was irrigated with bacitracin solution, checked for hemostasis which was established and confirmed. Once meticulous hemostasis was achieved, we then proceeded with closure with the assistance of my nurse practitioner. The platysma was closed with interrupted 3-0 undyed Vicryl suture, the subcuticular layer was closed with interrupted 3-0 undyed Vicryl suture. The skin edges were approximated with steristrips. The drapes were removed. A sterile dressing was applied.  The patient was then repositioned for the posterior part of the operation.  The patient was affixed a 3 point Mayfield headrest and rolled into the prone position on chest rolls. All pressure points were padded. The posterior cervical region was cleaned and prepped with DuraPrep and then draped in the usual sterile fashion. 7 cc of local anesthesia was injected and a dorsal midline incision made in the posterior cervical region and carried down to the cervical fascia. The fascia was opened and the paraspinous musculature was taken down to expose C3-C5. Intraoperative fluoroscopy confirmed my level and then the dissection was carried out over the lateral facets. I localized the midpoint of each lateral mass and marked a region 1 mm medial to the midpoint of the lateral mass, and then drilled in an upward and outward direction into the safe zone of each lateral mass. I drilled to a depth of 12 mm and then checked my drill hole with a ball probe. I then placed a 12 mm lateral mass screws into the safe zone of each lateral mass of C3 until they were 2 fingers tight.  At C4 we could not get good purchase with the screws, and therefore we went to C5 and we were able to get good purchase of 14 mm lateral  mass screws that were put in with the exact same technique.  I then moved the spinous process of C3 and gently decompressed the central canal with the 1 and 2 mm Kerrison punch from C3 to 4. Medial facetectomies were performed, and foraminotomies were performed at C3-4.  There was a fair amount of soft gelatinous mass between C3 and C4 going out into the facets.  This was all removed.  All bone and ligament and soft gelatinous mass was sent for frozen and permanent pathology.  Initial pathology showed no adenocarcinoma.  Once the decompression was complete the dura was full and capacious and I could see the spinal cord pulsatile through the dura. I then decorticated the lateral masses and the facet joints and packed them with  morcellized allograft to perform arthrodesis from C3-C5. I then placed rods into the multiaxial screw heads of the screws and locked these into position with the locking caps and anti-torque device. I then checked the final construct with AP/Lat fluoroscopy. I irrigated with saline solution containing bacitracin. I lined the dura with Gelfoam. After hemostasis was achieved I closed the muscle and the fascia with 0 Vicryl, subcutaneous tissue with 2-0 Vicryl, and the subcuticular tissue with 3-0 Vicryl. The skin was closed with benzoin and Steri-Strips. A sterile dressing was applied, the patient was turned to the supine position and taken out of the headrest, awakened from general anesthesia and transferred to the recovery room in stable condition. At the end of the procedure all sponge, needle and instrument counts were correct.    PLAN OF CARE: Admit for overnight observation  PATIENT  DISPOSITION:  PACU - hemodynamically stable.   Delay start of Pharmacological VTE agent (>24hrs) due to surgical blood loss or risk of bleeding:  yes

## 2023-07-25 NOTE — Transfer of Care (Signed)
Immediate Anesthesia Transfer of Care Note  Patient: Gabrielle Duncan  Procedure(s) Performed: ACDF - C3-C4 Cervial laminectomy C3-4 with lateral mass fusion/ fixation  Patient Location: PACU  Anesthesia Type:General  Level of Consciousness: awake, sedated, and drowsy  Airway & Oxygen Therapy: Patient Spontanous Breathing and Patient connected to face mask oxygen  Post-op Assessment: Report given to RN and Post -op Vital signs reviewed and stable  Post vital signs: Reviewed and stable  Last Vitals:  Vitals Value Taken Time  BP 127/74 07/25/23 1619  Temp 97.4   Pulse 62 07/25/23 1621  Resp 17 07/25/23 1621  SpO2 100 % 07/25/23 1621  Vitals shown include unfiled device data.  Last Pain:  Vitals:   07/25/23 1025  TempSrc:   PainSc: 7          Complications: No notable events documented.

## 2023-07-25 NOTE — H&P (Signed)
Subjective:   Patient is a 67 y.o. female admitted for cervical myelopathy, C3-4 stenosis with infection versus tumor in the posterior elements. The patient first presented to me with complaints of neck pain, numbness of the arm(s), and loss of strength of the arm(s). Onset of symptoms was several weeks ago. The pain is described as aching and occurs all day. The pain is rated moderate, and is located in the neck and radiates to the shoulders. The symptoms have been progressive. Symptoms are exacerbated by none, and are relieved by none.  Previous work up includes MRI of cervical spine, results: spinal stenosis.  Sed rate is 5 and CRP is 0.1.  White count is normal.  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   Left thyroid nodule 03/31/2013   Benign per thyroid biopsy in 2014   Mass of chest wall 12/2012   tender to the touch   Seasonal allergies     Past Surgical History:  Procedure Laterality Date   ABDOMINAL HYSTERECTOMY  1997   complete   ANTERIOR CERVICAL DECOMP/DISCECTOMY FUSION  08/08/2012   Procedure: ANTERIOR CERVICAL DECOMPRESSION/DISCECTOMY FUSION 2 LEVEL/HARDWARE REMOVAL;  Surgeon: Tia Alert, MD;  Location: [facility];  Service: Neurosurgery;  Laterality: N/A;  anterior cervical five-six to six seven decompression fusion with removal plate four-five   CERVICAL FUSION  2005   CHOLECYSTECTOMY  1997   EYE SURGERY     for dry eye syndrome   FRACTURE SURGERY Left 2024   femur,hip   HEMORRHOID SURGERY N/A 09/02/2013   Procedure: HEMORRHOIDECTOMY;  Surgeon: Clovis Pu. Cornett, MD;  Location: [facility];  Service: General;  Laterality: N/A;   LUMBAR FUSION  01/01/2013   L4-5   LUMBAR LAMINECTOMY/DECOMPRESSION MICRODISCECTOMY  01/01/2013   L4-5   MASS EXCISION N/A 01/29/2013   Procedure: EXCISION of chest wall mass 8 cm;  Surgeon: Almond Lint, MD;  Location:  [facility];  Service: General;  Laterality: N/A;   REDUCTION MAMMAPLASTY Bilateral    SHOULDER SURGERY  03/2012   debridement and partial clavical removal   TOTAL SHOULDER ARTHROPLASTY  09/24/2012   Procedure: TOTAL SHOULDER ARTHROPLASTY;  Surgeon: Eulas Post, MD;  Location: [facility];  Service: Orthopedics;  Laterality: Right;   TOTAL SHOULDER ARTHROPLASTY Left 02/18/2020   Dr. Dion Saucier    No Known Allergies  Social History   Tobacco Use   Smoking status: Never   Smokeless tobacco: Never  Substance Use Topics   Alcohol use: No    Family History  Problem Relation Age of Onset   Dementia Mother    CAD Mother 43       triple bypass   Thyroid cancer Mother    Pancreatitis Father    Diabetes Father    Heart disease Father    Thyroid disease Maternal Grandmother    Goiter Maternal Grandmother    Lung cancer Maternal Grandfather 24       smoker   Ovarian cancer Paternal Grandmother 75   Cancer Paternal Grandfather        Unknown origin with mets   Breast cancer Cousin    Breast cancer Cousin    Breast cancer Maternal Aunt        x3   Asthma Daughter    Thyroid disease Other  several maternal relatives   Prior to Admission medications   Medication Sig Start Date End Date Taking? Authorizing Provider  acetaminophen (TYLENOL) 325 MG tablet Take 325-650 mg by mouth every 6 (six) hours as needed for moderate pain (pain score 4-6).   Yes [provider]  calcium carbonate (TUMS - DOSED IN MG ELEMENTAL CALCIUM) 500 MG chewable tablet Chew 1-2 tablets by mouth daily as needed for indigestion or heartburn.   Yes [provider]  hydrocortisone cream 1 % Apply 1 Application topically 2 (two) times daily as needed for itching.   Yes [provider]     Review of Systems  Positive ROS: neg  All other systems have been reviewed and were otherwise negative with the exception of those mentioned in the HPI and as  above.  Objective: Vital signs in last 24 hours: Temp:  [98.7 F (37.1 C)] 98.7 F (37.1 C) (10/23 1004) Pulse Rate:  [79] 79 (10/23 1004) Resp:  [18] 18 (10/23 1004) BP: (135)/(95) 135/95 (10/23 1004) SpO2:  [100 %] 100 % (10/23 1004) Weight:  [66.7 kg] 66.7 kg (10/23 1004)  General Appearance: Alert, cooperative, no distress, appears stated age Head: Normocephalic, without obvious abnormality, atraumatic Eyes: PERRL, conjunctiva/corneas clear, EOM's intact      Neck: Supple, symmetrical, trachea midline, Back: Symmetric, no curvature, ROM normal, no CVA tenderness Lungs:  respirations unlabored Heart: Regular rate and rhythm Abdomen: Soft, non-tender Extremities: Extremities normal, atraumatic, no cyanosis or edema Pulses: 2+ and symmetric all extremities Skin: Skin color, texture, turgor normal, no rashes or lesions  NEUROLOGIC:  Mental status: Alert and oriented x4, no aphasia, good attention span, fund of knowledge and memory  Motor Exam - grossly normal Sensory Exam - grossly normal Reflexes: 1+ Coordination - grossly normal Gait - grossly normal Balance - grossly normal Cranial Nerves: I: smell Not tested  II: visual acuity  OS: nl    OD: nl  II: visual fields Full to confrontation  II: pupils Equal, round, reactive to light  III,VII: ptosis None  III,IV,VI: extraocular muscles  Full ROM  V: mastication Normal  V: facial light touch sensation  Normal  V,VII: corneal reflex  Present  VII: facial muscle function - upper  Normal  VII: facial muscle function - lower Normal  VIII: hearing Not tested  IX: soft palate elevation  Normal  IX,X: gag reflex Present  XI: trapezius strength  5/5  XI: sternocleidomastoid strength 5/5  XI: neck flexion strength  5/5  XII: tongue strength  Normal    Data Review Lab Results  Component Value Date   WBC 5.6 07/25/2023   HGB 12.2 07/25/2023   HCT 38.5 07/25/2023   MCV 84.2 07/25/2023   PLT 288 07/25/2023   Lab  Results  Component Value Date   NA 138 07/25/2023   K 3.1 (L) 07/25/2023   CL 107 07/25/2023   CO2 21 (L) 07/25/2023   BUN 11 07/25/2023   CREATININE 0.74 07/25/2023   GLUCOSE 103 (H) 07/25/2023   Lab Results  Component Value Date   INR 0.9 07/25/2023    Assessment:   Cervical neck pain with herniated nucleus pulposus/ spondylosis/ stenosis at C3-4. Estimated body mass index is 25.23 kg/m as calculated from the following:   Height as of this encounter: 5\' 4"  (1.626 m).   Weight as of this encounter: 66.7 kg.  Patient has failed conservative therapy. Planned surgery : ACDF C3-4 for stabilization followed by posterior cervical laminectomy and possible posterior  lateral arthrodesis, and we will send tissue for culture and pathology.  Plan:   I explained the condition and procedure to the patient and answered any questions.  Patient wishes to proceed with procedure as planned. Understands risks/ benefits/ and expected or typical outcomes.  Tia Alert 07/25/2023 12:24 PM

## 2023-07-25 NOTE — Anesthesia Procedure Notes (Signed)
Procedure Name: Intubation Date/Time: 07/25/2023 1:31 PM  Performed by: Pincus Large, CRNAPre-anesthesia Checklist: Patient identified, Emergency Drugs available, Suction available and Patient being monitored Patient Re-evaluated:Patient Re-evaluated prior to induction Oxygen Delivery Method: Circle System Utilized Preoxygenation: Pre-oxygenation with 100% oxygen Induction Type: IV induction Ventilation: Mask ventilation without difficulty Laryngoscope Size: 3 and Glidescope Grade View: Grade I Tube type: Oral Tube size: 7.0 mm Number of attempts: 1 Airway Equipment and Method: Stylet and Oral airway Placement Confirmation: ETT inserted through vocal cords under direct vision, positive ETCO2 and breath sounds checked- equal and bilateral Secured at: 21 cm Tube secured with: Tape Dental Injury: Teeth and Oropharynx as per pre-operative assessment

## 2023-07-26 MED FILL — Thrombin For Soln 5000 Unit: CUTANEOUS | Qty: 2 | Status: AC

## 2023-07-26 NOTE — Evaluation (Signed)
Occupational Therapy Evaluation Patient Details Name: Gabrielle Duncan MRN: [id number] DOB: Jan 07, 1956 Today's Date: 07/26/2023   History of Present Illness Pt is a 67 yr old female who presented 07/25/23 due to cervical myelopathy, C3-4 stenosis with infection versus tumor in posterior elements. PMH: allergies, cancer of left eyelid, cervical fusion, lumbar fusion, fx L hip, shoulder arthoplasty   Clinical Impression   Pt reports they are going to dc to their family home where they have a level entrance, walk in shower and adjustable bed. Pt at this time was able to complete UE dressing with supervision and LB dressing with CGA. Gabrielle Duncan was able to ambulate but requested to have hand held assist as recently had pain medication. She was educated about precautions and use of DME/AE with the return to home.     If plan is discharge home, recommend the following: Assistance with cooking/housework;Assist for transportation    Functional Status Assessment  Patient has had a recent decline in their functional status and demonstrates the ability to make significant improvements in function in a reasonable and predictable amount of time.  Equipment Recommendations  None recommended by OT    Recommendations for Other Services       Precautions / Restrictions Precautions Precautions: Fall;Cervical Precaution Booklet Issued: Yes (comment) Required Braces or Orthoses: Cervical Brace Cervical Brace: Soft collar Restrictions Weight Bearing Restrictions: No      Mobility Bed Mobility Overal bed mobility: Needs Assistance Bed Mobility: Supine to Sit, Sit to Supine     Supine to sit: Supervision Sit to supine: Supervision        Transfers Overall transfer level: Needs assistance Equipment used: 1 person hand held assist Transfers: Sit to/from Stand Sit to Stand: Supervision                  Balance Overall balance assessment: Needs assistance Sitting-balance support: Feet  supported Sitting balance-Leahy Scale: Good     Standing balance support: Single extremity supported Standing balance-Leahy Scale: Fair                             ADL either performed or assessed with clinical judgement   ADL Overall ADL's : Needs assistance/impaired Eating/Feeding: Modified independent;Sitting   Grooming: Wash/dry hands;Wash/dry face;Supervision/safety;Sitting   Upper Body Bathing: Supervision/ safety;Sitting   Lower Body Bathing: Contact guard assist;Sitting/lateral leans;Sit to/from stand;Cueing for safety;Cueing for sequencing;With adaptive equipment   Upper Body Dressing : Supervision/safety;Sitting   Lower Body Dressing: Contact guard assist;Cueing for safety;Cueing for sequencing;With adaptive equipment   Toilet Transfer: Contact guard assist;Cueing for safety;Cueing for sequencing   Toileting- Clothing Manipulation and Hygiene: Contact guard assist;Cueing for safety;Cueing for sequencing;With adaptive equipment   Tub/ Shower Transfer: Contact guard assist;Cueing for safety;Cueing for sequencing   Functional mobility during ADLs: Contact guard assist (hand held assist as pt was veryful due to just taking medications)       Vision Baseline Vision/History: 1 Wears glasses Ability to See in Adequate Light: 0 Adequate Patient Visual Report: No change from baseline Vision Assessment?: Wears glasses for reading     Perception         Praxis         Pertinent Vitals/Pain Pain Assessment Pain Assessment: Faces Faces Pain Scale: Hurts a little bit Pain Location: sx site Pain Descriptors / Indicators: Discomfort Pain Intervention(s): Limited activity within patient's tolerance, Monitored during session     Extremity/Trunk Assessment Upper Extremity Assessment Upper  Extremity Assessment: Right hand dominant;LUE deficits/detail LUE Deficits / Details: noted decrease in LUE strength compared to R side due to pain LUE: Unable to fully  assess due to pain LUE Sensation: WNL LUE Coordination: WNL   Lower Extremity Assessment Lower Extremity Assessment: Defer to PT evaluation   Cervical / Trunk Assessment Cervical / Trunk Assessment: Neck Surgery   Communication Communication Communication: No apparent difficulties   Cognition Arousal: Alert Behavior During Therapy: WFL for tasks assessed/performed Overall Cognitive Status: Within Functional Limits for tasks assessed                                       General Comments       Exercises     Shoulder Instructions      Home Living Family/patient expects to be discharged to:: Private residence Living Arrangements: Spouse/significant other Available Help at Discharge: Friend(s);Family Type of Home: House Home Access: Level entry     Home Layout: One level     Bathroom Shower/Tub: Producer, television/film/video: Standard Bathroom Accessibility: Yes   Home Equipment: Merchant navy officer (hurricane) Adaptive Equipment: Reacher;Sock aid;Long-handled shoe horn;Long-handled sponge Additional Comments: Pt reports they are going to stay with friend first which is the listed set up      Prior Functioning/Environment Prior Level of Function : Independent/Modified Independent                        OT Problem List: Decreased strength;Decreased activity tolerance;Impaired balance (sitting and/or standing);Decreased knowledge of use of DME or AE;Pain      OT Treatment/Interventions: Self-care/ADL training;Therapeutic activities;Patient/family education;Balance training    OT Goals(Current goals can be found in the care plan section) Acute Rehab OT Goals Patient Stated Goal: to go home OT Goal Formulation: With patient Time For Goal Achievement: 08/09/23 Potential to Achieve Goals: Good  OT Frequency: Min 2X/week    Co-evaluation              AM-PAC OT "6 Clicks" Daily Activity     Outcome Measure Help from another  person eating meals?: None Help from another person taking care of personal grooming?: A Little Help from another person toileting, which includes using toliet, bedpan, or urinal?: A Little Help from another person bathing (including washing, rinsing, drying)?: A Little Help from another person to put on and taking off regular upper body clothing?: None Help from another person to put on and taking off regular lower body clothing?: A Little 6 Click Score: 20   End of Session Equipment Utilized During Treatment: Gait belt Nurse Communication: Mobility status  Activity Tolerance: Patient tolerated treatment well Patient left: in bed;with call bell/phone within reach;with family/visitor present  OT Visit Diagnosis: Unsteadiness on feet (R26.81);Other abnormalities of gait and mobility (R26.89);Repeated falls (R29.6);Muscle weakness (generalized) (M62.81);Pain Pain - Right/Left:  (neck)                Time: 1017-1100 OT Time Calculation (min): 43 min Charges:  OT General Charges $OT Visit: 1 Visit OT Evaluation $OT Eval Low Complexity: 1 Low OT Treatments $Self Care/Home Management : 23-37 mins  Presley Raddle OTR/L  Acute Rehab Services  [phone] office number   Alphia Moh 07/26/2023, 11:26 AM

## 2023-07-26 NOTE — Progress Notes (Signed)
She's doing well, pain control is an issue, but swallowing ok, moving well, neuro better. Await CT results, frozen path encouraging but awaiting final path over the next few days.

## 2023-07-27 ENCOUNTER — Encounter (HOSPITAL_COMMUNITY): Payer: Self-pay | Admitting: Neurological Surgery

## 2023-07-27 DIAGNOSIS — M4712 Other spondylosis with myelopathy, cervical region: Secondary | ICD-10-CM | POA: Diagnosis present

## 2023-07-27 DIAGNOSIS — Z8041 Family history of malignant neoplasm of ovary: Secondary | ICD-10-CM | POA: Diagnosis not present

## 2023-07-27 DIAGNOSIS — Z803 Family history of malignant neoplasm of breast: Secondary | ICD-10-CM | POA: Diagnosis not present

## 2023-07-27 DIAGNOSIS — Z8249 Family history of ischemic heart disease and other diseases of the circulatory system: Secondary | ICD-10-CM | POA: Diagnosis not present

## 2023-07-27 DIAGNOSIS — E042 Nontoxic multinodular goiter: Secondary | ICD-10-CM | POA: Diagnosis present

## 2023-07-27 DIAGNOSIS — Z808 Family history of malignant neoplasm of other organs or systems: Secondary | ICD-10-CM | POA: Diagnosis not present

## 2023-07-27 DIAGNOSIS — Z96612 Presence of left artificial shoulder joint: Secondary | ICD-10-CM | POA: Diagnosis present

## 2023-07-27 DIAGNOSIS — Z833 Family history of diabetes mellitus: Secondary | ICD-10-CM | POA: Diagnosis not present

## 2023-07-27 DIAGNOSIS — Z90710 Acquired absence of both cervix and uterus: Secondary | ICD-10-CM | POA: Diagnosis not present

## 2023-07-27 DIAGNOSIS — Z981 Arthrodesis status: Secondary | ICD-10-CM

## 2023-07-27 DIAGNOSIS — Z825 Family history of asthma and other chronic lower respiratory diseases: Secondary | ICD-10-CM | POA: Diagnosis not present

## 2023-07-27 DIAGNOSIS — M5001 Cervical disc disorder with myelopathy,  high cervical region: Secondary | ICD-10-CM | POA: Diagnosis present

## 2023-07-27 DIAGNOSIS — M4802 Spinal stenosis, cervical region: Secondary | ICD-10-CM | POA: Diagnosis present

## 2023-07-27 DIAGNOSIS — Z818 Family history of other mental and behavioral disorders: Secondary | ICD-10-CM | POA: Diagnosis not present

## 2023-07-27 DIAGNOSIS — Z9049 Acquired absence of other specified parts of digestive tract: Secondary | ICD-10-CM | POA: Diagnosis not present

## 2023-07-27 DIAGNOSIS — Z801 Family history of malignant neoplasm of trachea, bronchus and lung: Secondary | ICD-10-CM | POA: Diagnosis not present

## 2023-07-27 LAB — TSH: TSH: 1.032 u[IU]/mL (ref 0.350–4.500)

## 2023-07-27 MED ORDER — DEXAMETHASONE SODIUM PHOSPHATE 4 MG/ML IJ SOLN: 2.0000 mg | Freq: Four times a day (QID) | INTRAMUSCULAR | Status: DC

## 2023-07-27 MED ORDER — DEXAMETHASONE 4 MG PO TABS
2.0000 mg | ORAL_TABLET | Freq: Four times a day (QID) | ORAL | Status: DC
  Administered 2023-07-27 – 2023-07-28 (×4): 2 mg via ORAL
  Filled 2023-07-27 (×4): qty 1

## 2023-07-27 NOTE — Progress Notes (Signed)
Subjective: Patient reports some soreness but overall doing really well  Objective: Vital signs in last 24 hours: Temp:  [97.9 F (36.6 C)-98.2 F (36.8 C)] 98.2 F (36.8 C) (10/25 0729) Pulse Rate:  [74-81] 74 (10/25 0729) Resp:  [16-19] 19 (10/25 0729) BP: (113-150)/(59-91) 145/76 (10/25 0729) SpO2:  [97 %-100 %] 100 % (10/25 0729)  Intake/Output from previous day: No intake/output data recorded. Intake/Output this shift: No intake/output data recorded.  Neurologic: Grossly normal Dressings dry  Lab Results: Lab Results  Component Value Date   WBC 5.6 07/25/2023   HGB 12.2 07/25/2023   HCT 38.5 07/25/2023   MCV 84.2 07/25/2023   PLT 288 07/25/2023   Lab Results  Component Value Date   INR 0.9 07/25/2023   BMET Lab Results  Component Value Date   NA 138 07/25/2023   K 3.1 (L) 07/25/2023   CL 107 07/25/2023   CO2 21 (L) 07/25/2023   GLUCOSE 103 (H) 07/25/2023   BUN 11 07/25/2023   CREATININE 0.74 07/25/2023   CALCIUM 9.3 07/25/2023    Studies/Results: CT CHEST ABDOMEN PELVIS W CONTRAST  Result Date: 07/26/2023 CLINICAL DATA:  Metastatic disease evaluation. History of abnormal enhancement in the epidural space at C3-C4. EXAM: CT CHEST, ABDOMEN, AND PELVIS WITH CONTRAST TECHNIQUE: Multidetector CT imaging of the chest, abdomen and pelvis was performed following the standard protocol during bolus administration of intravenous contrast. RADIATION DOSE REDUCTION: This exam was performed according to the departmental dose-optimization program which includes automated exposure control, adjustment of the mA and/or kV according to patient size and/or use of iterative reconstruction technique. CONTRAST:  75mL OMNIPAQUE IOHEXOL 350 MG/ML SOLN COMPARISON:  CTA abdomen and pelvis 11/07/2022 and neck CT 06/03/2013 FINDINGS: CT CHEST FINDINGS Cardiovascular: Heart size is normal. No significant pericardial effusion. Normal caliber of the thoracic aorta. Mediastinum/Nodes:  Enlarged mediastinal and hilar lymph nodes. Subcarinal tissue measures 1.4 cm in the short axis. Prominent right hilar tissue on image 32/4 measures 1.1 cm in the short axis. Left hilar tissue measures 1.0 cm in short axis on image 25/4. The mediastinal and hilar lymphadenopathy is new since 2014. No significant axillary lymph node enlargement. Heterogeneous left thyroid nodule that measures up to 2.5 cm. History of thyroid ultrasound and biopsy in 2014. Lungs/Pleura: Trachea and mainstem bronchi are patent. Mild bronchiectasis in the medial right lower lobe. Small patchy densities in the right upper lobe and right middle lobe are nonspecific but could be post inflammatory. 6 mm nodule along the left major fissure on image 58. Subpleural nodule in the posterior right upper lobe measures 4 mm. 3 mm nodule in the left lower lobe on image 62/6. No large pleural effusions. 3 mm nodular density at the left lung apex on image 20/6. Probable pulmonary nodule in the right upper lung on image 54/6 that measures 7 x 5 mm. Musculoskeletal: Scattered soft tissue gas in the neck compatible with recent cervical spine surgery. Anterior plate and screw fixation in the anterior cervical spine is partially imaged. Bilateral shoulder arthroplasties. CT ABDOMEN PELVIS FINDINGS Hepatobiliary: Multiple hepatic cysts. Gallbladder has been removed. Mild intrahepatic and extrahepatic biliary dilatation is again noted. Common bile duct roughly measures 7 mm and minimally changed. Pancreas: Unremarkable. No pancreatic ductal dilatation or surrounding inflammatory changes. Spleen: Normal in size without focal abnormality. Adrenals/Urinary Tract: Normal adrenal glands. No hydronephrosis. No suspicious renal lesion. Gas in the urinary bladder is likely iatrogenic. Significant artifact in the pelvis from the left hip arthroplasty. Stomach/Bowel: No bowel dilatation  or obstruction. No focal bowel inflammation. Normal appearance of the stomach.  Vascular/Lymphatic: Aortic atherosclerosis. No enlarged abdominal or pelvic lymph nodes. Reproductive: Limited evaluation of the pelvis due to the hip hardware artifact. The uterus appears to be surgically absent. No evidence for an adnexal mass. Other: Negative for free fluid.  Negative for free air. Musculoskeletal: Left hip arthroplasty is located. Posterior lumbar interbody fusion at L4-L5 with bilateral pedicle screws and rods and interbody device. No acute bone abnormality. No suspicious osseous lesion. IMPRESSION: 1. Mediastinal and hilar lymphadenopathy of uncertain etiology. The lymphadenopathy is new since 2014. Differential diagnosis includes a lymphoproliferative process and metastatic disease. Sarcoidosis is also in the differential based on the symmetric appearance of the hilar lymphadenopathy and the few peri-fissural and peribronchial nodules. Recommend pulmonary consultation. 2. Several small pulmonary nodules that are indeterminate. Largest nodule measures 7 mm in the right upper lobe. Non-contrast chest CT at 3-6 months is recommended. If the nodules are stable at time of repeat CT, then future CT at 18-24 months (from today's scan) is considered optional for low-risk patients, but is recommended for high-risk patients. This recommendation follows the consensus statement: Guidelines for Management of Incidental Pulmonary Nodules Detected on CT Images: From the Fleischner Society 2017; Radiology 2017; 284:228-243. 3. No evidence for neoplastic or metastatic disease in the abdomen or pelvis. 4. Stable intrahepatic and extrahepatic biliary dilatation is likely associated with the cholecystectomy. 5. Gas within the urinary bladder is likely iatrogenic. 6. Postsurgical changes in the neck. 7. Left thyroid nodule or left thyroid nodules. Nodular disease in left thyroid lobe appears to have increased since 2014. Patient has history of previous biopsy in the left thyroid lobe. Consider follow-up thyroid  ultrasound. Electronically Signed   By: Richarda Overlie M.D.   On: 07/26/2023 13:44   DG Cervical Spine 1 View  Result Date: 07/25/2023 CLINICAL DATA:  Elective surgery. EXAM: DG CERVICAL SPINE - 1 VIEW COMPARISON:  Preoperative imaging. FINDINGS: Five fluoroscopic spot views of the cervical spine obtained in the operating room. There has been prior C5 through C7 fusion. New anterior and posterior fusion at C3-C4 with interbody spacer. Fluoroscopy time 36.4 seconds. Dose 2.47 mGy. IMPRESSION: Intraoperative fluoroscopy during C3-C4 fusion. Electronically Signed   By: Narda Rutherford M.D.   On: 07/25/2023 17:53   DG C-Arm 1-60 Min-No Report  Result Date: 07/25/2023 Fluoroscopy was utilized by the requesting physician.  No radiographic interpretation.   DG C-Arm 1-60 Min-No Report  Result Date: 07/25/2023 Fluoroscopy was utilized by the requesting physician.  No radiographic interpretation.   DG C-Arm 1-60 Min-No Report  Result Date: 07/25/2023 Fluoroscopy was utilized by the requesting physician.  No radiographic interpretation.    Assessment/Plan: Doing really well Check serum ACE CBC Pulmonary consult for chest CT findings  Estimated body mass index is 25.23 kg/m as calculated from the following:   Height as of this encounter: 5\' 4"  (1.626 m).   Weight as of this encounter: 66.7 kg.    LOS: 1 day    Tia Alert 07/27/2023, 7:58 AM

## 2023-07-27 NOTE — Discharge Instructions (Signed)
 Wound Care Keep incision covered and dry until post op day 3. You may remove the Honeycomb dressing on post op day 3. Leave steri-strips on back.  They will fall off by themselves. Do not put any creams, lotions, or ointments on incision. You are fine to shower. Let water run over incision and pat dry.  Activity Activity Walk each and every day, increasing distance each day. No lifting greater than 8 lbs.  No lifting no bending no twisting no driving or riding a car unless coming back and forth to see the doctor. If provided with back brace, wear when out of bed.  It is not necessary to wear brace in bed.  Diet Resume your normal diet.   Return to Work Will be discussed at your follow up appointment.  Call Your Doctor If Any of These Occur Redness, drainage, or swelling at the wound.  Temperature greater than 101 degrees. Severe pain not relieved by pain medication. Incision starts to come apart.  Follow Up Appt Call [phone] if you have one or any problem.

## 2023-07-27 NOTE — Consult Note (Addendum)
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  Initial frozen section of mass negative for adeno.  Staging scan post resection shows enlarging thyroid nodules, multiple pulmonary nodules and hilar/mediastinal adenopathy.  Pulmonology is consulted for further management.  Currently doing very well.  Does notice increasing DOE over past 5 months since a fall in which she broke her hip.  More recently she has developed unintentional weight loss of nearly 30 lbs.  No night sweats.  No cough.  No unusual exposures/pets.  Nonsmoker.  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 Decompressive anterior cervical discectomy C3-4, 2. Anterior cervical arthrodesis C3-4 utilizing a 8 mm structural allograft, 3. Anterior cervical plating C3-4 utilizing a ATEC plate, 4.  Posterior cervical decompressive laminectomy C3-4, 5.  Posterior cervical arthrodesis with lateral mass instrumentation C3-C5 utilizing DBM putty, 6.  Nonsegmental fixation C3-C5 utilizing ATEC lateral mass screws   Interim History / Subjective:  N/A  Objective   Blood pressure (!) 145/76, pulse 74, temperature 98.2 F (36.8 C),  temperature source Oral, resp. rate 19, height 5\' 4"  (1.626 m), weight 66.7 kg, SpO2 100%.        Intake/Output Summary (Last 24 hours) at 07/27/2023 1012 Last data filed at 07/27/2023 0730 Gross per 24 hour  Intake 240 ml  Output --  Net 240 ml   Filed Weights   07/25/23 1004  Weight: 66.7 kg    Examination: General: loquacious woman in NAD HENT: MMM, good dentition, c collar in place Lungs: Clear, no wheezing, nonlabored breathing pattern Cardiovascular: regular, ext warm, good pulses Abdomen: soft, hypoactive BS Extremities: no edema, +stigmata of osteoarthritis Neuro: moves to command Skin: no rashes  Resolved Hospital Problem list   N/A  Assessment & Plan:  Unintentional weight loss, DOE, adenopathy, lung nodules  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  - First, need to review final path from cervical mass; if confirms metastatic cancer, no further procedures need done - If unrevealing or more tissue needed, will need bronch w/ EBUS guided sampling of hilar and mediastinal nodes - Have discussed case with Dr. Aundria Rud who will see her in clinic Tuesday 07/31/23 and schedule bronchoscopy if needed - Patient understands and agrees with plan  Best Practice (right click and "Reselect all SmartList Selections" daily)   Per primary  Labs   CBC: Recent Labs  Lab 07/25/23 1004  WBC 5.6  HGB 12.2  HCT 38.5  MCV 84.2  PLT 288    Basic Metabolic Panel: Recent Labs  Lab 07/25/23 1004  NA 138  K 3.1*  CL 107  CO2 21*  GLUCOSE 103*  BUN 11  CREATININE 0.74  CALCIUM 9.3   GFR: Estimated Creatinine Clearance: 62.3 mL/min (by C-G formula based on SCr of 0.74 mg/dL). Recent Labs  Lab 07/25/23 1004  WBC 5.6    Liver Function Tests: No results for input(s): "AST", "ALT", "ALKPHOS", "BILITOT", "PROT", "ALBUMIN" in the last 168 hours. No results for input(s):  "LIPASE", "AMYLASE" in the last 168 hours. No results for input(s): "AMMONIA" in the last 168 hours.  ABG No results found for: "PHART", "PCO2ART", "PO2ART", "HCO3", "TCO2", "ACIDBASEDEF", "O2SAT"   Coagulation Profile: Recent Labs  Lab 07/25/23 1005  INR 0.9    Cardiac Enzymes: No results for input(s): "CKTOTAL", "CKMB", "CKMBINDEX", "TROPONINI" in the last 168 hours.  HbA1C: Hgb A1c MFr Bld  Date/Time Value Ref Range Status  05/04/2023 09:57 AM 6.1 (H) <5.7 % of total Hgb Final    Comment:    For someone without known diabetes, a hemoglobin  A1c value between 5.7% and 6.4% is consistent with prediabetes and should be confirmed with a  follow-up test. . For someone with known diabetes, a value <7% indicates that their diabetes is well controlled. A1c targets should be individualized based on duration of diabetes, age, comorbid conditions, and other considerations. . This assay result is consistent with an increased risk of diabetes. . Currently, no consensus exists regarding use of hemoglobin A1c for diagnosis of diabetes for children. Marland Kitchen   05/03/2022 10:06 AM 5.6 <5.7 % of total Hgb Final    Comment:    For the purpose of screening for the presence of diabetes: . <5.7%       Consistent with the absence of diabetes 5.7-6.4%    Consistent with increased risk for diabetes             (prediabetes) > or =6.5%  Consistent with diabetes . This assay result is consistent with a decreased risk of diabetes. . Currently, no consensus exists regarding use of hemoglobin A1c for diagnosis of diabetes in children. . According to American Diabetes Association (ADA) guidelines, hemoglobin A1c <7.0% represents optimal control in non-pregnant diabetic patients. Different metrics may apply to specific patient populations.  Standards of Medical Care in Diabetes(ADA). .     CBG: No results for input(s): "GLUCAP" in the last 168 hours.  Review of Systems:    Positive  Symptoms in bold:  Constitutional fevers, chills, weight loss, fatigue, anorexia, malaise  Eyes decreased vision, double vision, eye irritation  Ears, Nose, Mouth, Throat sore throat, trouble swallowing, sinus congestion  Cardiovascular chest pain, paroxysmal nocturnal dyspnea, lower ext edema, palpitations   Respiratory SOB, cough, DOE, hemoptysis, wheezing  Gastrointestinal nausea, vomiting, diarrhea  Genitourinary burning with urination, trouble urinating  Musculoskeletal joint aches, joint swelling, back pain  Integumentary  rashes, skin lesions  Neurological focal weakness, focal numbness, trouble speaking, headaches  Psychiatric depression, anxiety, confusion  Endocrine polyuria, polydipsia, cold intolerance, heat intolerance  Hematologic abnormal bruising, abnormal bleeding, unexplained nose bleeds  Allergic/Immunologic recurrent infections, hives, swollen lymph nodes     Past Medical History:  She,  has a past medical history of Allergy, Arthritis, Cancer (HCC) (01/2023), Left thyroid nodule (03/31/2013), Mass of chest wall (12/2012), and Seasonal allergies.   Surgical History:   Past Surgical History:  Procedure Laterality Date   ABDOMINAL HYSTERECTOMY  1997   complete   ANTERIOR CERVICAL DECOMP/DISCECTOMY FUSION  08/08/2012   Procedure: ANTERIOR CERVICAL DECOMPRESSION/DISCECTOMY FUSION 2 LEVEL/HARDWARE REMOVAL;  Surgeon: Tia Alert, MD;  Location: [facility]UR[facility]S;  Service: Neurosurgery;  Laterality: N/A;  anterior cervical five-six to six seven decompression fusion with removal plate four-five   CERVICAL FUSION  2005   CHOLECYSTECTOMY  1997   EYE SURGERY     for dry eye syndrome   FRACTURE SURGERY Left 2024   femur,hip   HEMORRHOID SURGERY N/A 09/02/2013   Procedure: HEMORRHOIDECTOMY;  Surgeon: Clovis Pu. Cornett, MD;  Location: [facility];  Service: General;  Laterality: N/A;   LUMBAR FUSION  01/01/2013   L4-5   LUMBAR LAMINECTOMY/DECOMPRESSION  MICRODISCECTOMY  01/01/2013   L4-5   MASS EXCISION N/A 01/29/2013   Procedure: EXCISION of chest wall mass 8 cm;  Surgeon: Almond Lint, MD;  Location: [facility];  Service: General;  Laterality: N/A;   REDUCTION MAMMAPLASTY Bilateral    SHOULDER SURGERY  03/2012   debridement and partial clavical removal   TOTAL SHOULDER ARTHROPLASTY  09/24/2012   Procedure: TOTAL SHOULDER ARTHROPLASTY;  Surgeon: Eulas Post, MD;  Location: [facility] ORS;  Service: Orthopedics;  Laterality: Right;   TOTAL SHOULDER ARTHROPLASTY Left 02/18/2020   Dr. Dion Saucier     Social History:   reports that she has never smoked. She has never used smokeless tobacco. She reports that she does not drink alcohol and does not use drugs.   Family History:  Her family history includes Asthma in her daughter; Breast cancer in her cousin, cousin, and maternal aunt; CAD (age of onset: 77) in her mother; Cancer in her paternal grandfather; Dementia in her mother; Diabetes in her father; Goiter in her maternal grandmother; Heart disease in her father; Lung cancer (age of onset: 35) in her maternal grandfather; Ovarian cancer (age of onset: 42) in her paternal grandmother; Pancreatitis in her father; Thyroid cancer in her mother; Thyroid disease in her maternal grandmother and another family member.   Allergies No Known Allergies   Home Medications  Prior to Admission medications   Medication Sig Start Date End Date Taking? Authorizing Provider  acetaminophen (TYLENOL) 325 MG tablet Take 325-650 mg by mouth every 6 (six) hours as needed for moderate pain (pain score 4-6).   Yes [provider]  calcium carbonate (TUMS - DOSED IN MG ELEMENTAL CALCIUM) 500 MG chewable tablet Chew 1-2 tablets by mouth daily as needed for indigestion or heartburn.   Yes [provider]  hydrocortisone cream 1 % Apply 1 Application topically 2 (two) times daily as needed for itching.   Yes [provider]      Critical care time: N/A

## 2023-07-27 NOTE — Progress Notes (Signed)
Occupational Therapy Treatment Patient Details Name: Gabrielle Duncan MRN: [id number] DOB: [birth year] Today's Date: 07/27/2023   History of present illness Pt is a 67 yr old female who presented 07/25/23 due to cervical myelopathy, C3-4 stenosis with infection versus tumor in posterior elements. PMH: allergies, cancer of left eyelid, cervical fusion, lumbar fusion, fx L hip, shoulder arthoplasty   OT comments  Pt progressed very well, recalls 3/4 of her C-spine precautions without cues. Pt verbalizing and demonstrating step by step process to how she complete her dressing and grooming routine this morning. Pt has no further acute skilled OT needs. No follow-up OT needed at this time, reconsult OT if functional status changes.       If plan is discharge home, recommend the following:  Assistance with cooking/housework;Assist for transportation   Equipment Recommendations  None recommended by OT    Recommendations for Other Services      Precautions / Restrictions Precautions Precautions: Fall;Cervical Precaution Booklet Issued: Yes (comment) Required Braces or Orthoses: Cervical Brace Cervical Brace: Soft collar Restrictions Weight Bearing Restrictions: No       Mobility Bed Mobility               General bed mobility comments: Pt OOB on arrival    Transfers Overall transfer level: Needs assistance Equipment used: None Transfers: Sit to/from Stand Sit to Stand: Modified independent (Device/Increase time)                 Balance Overall balance assessment: Needs assistance Sitting-balance support: Feet supported Sitting balance-Leahy Scale: Good     Standing balance support: Single extremity supported Standing balance-Leahy Scale: Fair Standing balance comment: no AD                           ADL either performed or assessed with clinical judgement   ADL                                       Functional mobility during ADLs:  Independent General ADL Comments: Pt ambulating in room and hallways independently. reinforced C-spine precautions she recalls them with one cue to recall no neck ext. Pt already dressed on arrival, verbalized her step by step sequence of UBD. Simulated LBD, pt states she uses AE to assist. Verbalzied and demonstrated use of compensatory sink strategies to complete oral care.    Extremity/Trunk Assessment              Vision       Perception     Praxis      Cognition Arousal: Alert Behavior During Therapy: WFL for tasks assessed/performed Overall Cognitive Status: Within Functional Limits for tasks assessed                                          Exercises      Shoulder Instructions       General Comments VSS    Pertinent Vitals/ Pain       Pain Assessment Pain Assessment: No/denies pain  Home Living  Prior Functioning/Environment              Frequency  Other (comment) (DC)        Progress Toward Goals  OT Goals(current goals can now be found in the care plan section)  Progress towards OT goals: Goals met/education completed, patient discharged from OT  Acute Rehab OT Goals Patient Stated Goal: to go home OT Goal Formulation: With patient Time For Goal Achievement: 08/09/23 Potential to Achieve Goals: Good  Plan      Co-evaluation                 AM-PAC OT "6 Clicks" Daily Activity     Outcome Measure   Help from another person eating meals?: None Help from another person taking care of personal grooming?: A Little Help from another person toileting, which includes using toliet, bedpan, or urinal?: A Little Help from another person bathing (including washing, rinsing, drying)?: A Little Help from another person to put on and taking off regular upper body clothing?: None Help from another person to put on and taking off regular lower body clothing?: A  Little 6 Click Score: 20    End of Session Equipment Utilized During Treatment: Cervical collar  OT Visit Diagnosis: Unsteadiness on feet (R26.81);Other abnormalities of gait and mobility (R26.89);Repeated falls (R29.6);Muscle weakness (generalized) (M62.81);Pain Pain - Right/Left:  (neck)   Activity Tolerance Patient tolerated treatment well   Patient Left Other (comment) (pt wanting to finish ambulating in hallway, given her current level of function she was deemed safe to ambulate in hall without assist)   Nurse Communication Mobility status        Time: 1324-4010 OT Time Calculation (min): 24 min  Charges: OT General Charges $OT Visit: 1 Visit OT Treatments $Self Care/Home Management : 8-22 mins $Therapeutic Activity: 8-22 mins  07/27/2023  AB, OTR/L  Acute Rehabilitation Services  Office: [phone]   Tristan Schroeder 07/27/2023, 12:12 PM

## 2023-07-27 NOTE — Anesthesia Postprocedure Evaluation (Signed)
Anesthesia Post Note  Patient: Gabrielle Duncan  Procedure(s) Performed: Anterior Cervical Decompression/Discectomy Fusion - Cervical Three-Cervical Four (Spine Cervical) Cervial Laminectomy Cervical Three-Cervical Four With Lateral Mass Fusion/ Fixation (Spine Cervical)     Patient location during evaluation: PACU Anesthesia Type: General Level of consciousness: awake and alert Pain management: pain level controlled Vital Signs Assessment: post-procedure vital signs reviewed and stable Respiratory status: spontaneous breathing, nonlabored ventilation, respiratory function stable and patient connected to nasal cannula oxygen Cardiovascular status: blood pressure returned to baseline and stable Postop Assessment: no apparent nausea or vomiting Anesthetic complications: no  No notable events documented.  Last Vitals:  Vitals:   07/27/23 0346 07/27/23 0729  BP: (!) 113/59 (!) 145/76  Pulse: 75 74  Resp: 18 19  Temp: 36.8 C 36.8 C  SpO2: 98% 100%    Last Pain:  Vitals:   07/27/23 0729  TempSrc: Oral  PainSc:    Pain Goal: Patients Stated Pain Goal: 3 (07/27/23 0620)                 [name] L Eufemio Strahm

## 2023-07-28 MED ORDER — OXYCODONE-ACETAMINOPHEN 5-325 MG PO TABS
1.0000 | ORAL_TABLET | ORAL | 0 refills | Status: DC | PRN
Start: 2023-07-28 — End: 2024-01-27

## 2023-07-28 MED ORDER — METHOCARBAMOL 500 MG PO TABS: 500.0000 mg | ORAL_TABLET | Freq: Four times a day (QID) | ORAL | 0 refills | Status: DC | PRN

## 2023-07-28 NOTE — Discharge Summary (Signed)
Physician Discharge Summary  Patient ID: Gabrielle Duncan MRN: [id number] DOB/AGE: 1955-10-27 67 y.o.  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 The patient tolerated the procedure well and was taken to the recovery room and then to the same in stable condition. The hospital course was routine. There were no complications. The wound remained clean dry and intact. Pt had appropriate neck soreness. No complaints of arm pain or new N/T/W. The patient remained afebrile with stable vital signs, and tolerated a regular diet. The patient continued to increase activities, and pain was well controlled with oral pain medications.   Consults: None  Significant Diagnostic Studies:  Results for orders placed or performed during the hospital encounter of 07/25/23  Surgical pcr screen   Specimen: Nasal Mucosa; Nasal Swab  Result Value Ref Range   MRSA, PCR NEGATIVE NEGATIVE   Staphylococcus aureus NEGATIVE NEGATIVE  Basic metabolic panel per protocol  Result Value Ref Range   Sodium 138 135 - 145 mmol/L   Potassium 3.1 (L) 3.5 - 5.1 mmol/L   Chloride 107 98 - 111 mmol/L   CO2 21 (L) 22 - 32 mmol/L   Glucose, Bld 103 (H) 70 - 99 mg/dL   BUN 11 8 - 23 mg/dL   Creatinine, Ser 7.82 0.44 - 1.00 mg/dL   Calcium 9.3 8.9 - 95.6 mg/dL   GFR, Estimated >21 >30 mL/min   Anion gap 10 5 - 15  CBC per protocol  Result Value Ref Range   WBC 5.6 4.0 - 10.5 K/uL   RBC 4.57 3.87 - 5.11 MIL/uL   Hemoglobin 12.2 12.0 - 15.0 g/dL   HCT 86.5 78.4 - 69.6  %   MCV 84.2 80.0 - 100.0 fL   MCH 26.7 26.0 - 34.0 pg   MCHC 31.7 30.0 - 36.0 g/dL   RDW 29.5 (H) 28.4 - 13.2 %   Platelets 288 150 - 400 K/uL   nRBC 0.0 0.0 - 0.2 %  Protime-INR  Result Value Ref Range   Prothrombin Time 12.7 11.4 - 15.2 seconds   INR 0.9 0.8 - 1.2  TSH  Result Value Ref Range   TSH 1.032 0.350 - 4.500 uIU/mL  Type and screen [facility]  Result Value Ref Range   ABO/RH(D) O POS    Antibody Screen NEG    Sample Expiration      07/28/2023,2359 Performed at [facility], [address] [place], [place] [address]     CT CHEST ABDOMEN PELVIS W CONTRAST  Result Date: 07/26/2023 CLINICAL DATA:  Metastatic disease evaluation. History of abnormal enhancement in the epidural space at C3-C4. EXAM: CT CHEST, ABDOMEN, AND PELVIS WITH CONTRAST TECHNIQUE: Multidetector CT imaging of the chest, abdomen and pelvis was performed following the standard protocol during bolus administration of intravenous contrast. RADIATION DOSE REDUCTION: This exam was performed according to the departmental dose-optimization program which includes automated exposure control, adjustment of the mA and/or kV according to patient size and/or use of iterative reconstruction technique. CONTRAST:  75mL OMNIPAQUE IOHEXOL 350 MG/ML SOLN COMPARISON:  CTA abdomen and pelvis 11/07/2022 and neck CT  06/03/2013 FINDINGS: CT CHEST FINDINGS Cardiovascular: Heart size is normal. No significant pericardial effusion. Normal caliber of the thoracic aorta. Mediastinum/Nodes: Enlarged mediastinal and hilar lymph nodes. Subcarinal tissue measures 1.4 cm in the short axis. Prominent right hilar tissue on image 32/4 measures 1.1 cm in the short axis. Left hilar tissue measures 1.0 cm in short axis on image 25/4. The mediastinal and hilar lymphadenopathy is new since 2014. No significant axillary lymph node enlargement. Heterogeneous left thyroid nodule that measures up to 2.5 cm. History of thyroid  ultrasound and biopsy in 2014. Lungs/Pleura: Trachea and mainstem bronchi are patent. Mild bronchiectasis in the medial right lower lobe. Small patchy densities in the right upper lobe and right middle lobe are nonspecific but could be post inflammatory. 6 mm nodule along the left major fissure on image 58. Subpleural nodule in the posterior right upper lobe measures 4 mm. 3 mm nodule in the left lower lobe on image 62/6. No large pleural effusions. 3 mm nodular density at the left lung apex on image 20/6. Probable pulmonary nodule in the right upper lung on image 54/6 that measures 7 x 5 mm. Musculoskeletal: Scattered soft tissue gas in the neck compatible with recent cervical spine surgery. Anterior plate and screw fixation in the anterior cervical spine is partially imaged. Bilateral shoulder arthroplasties. CT ABDOMEN PELVIS FINDINGS Hepatobiliary: Multiple hepatic cysts. Gallbladder has been removed. Mild intrahepatic and extrahepatic biliary dilatation is again noted. Common bile duct roughly measures 7 mm and minimally changed. Pancreas: Unremarkable. No pancreatic ductal dilatation or surrounding inflammatory changes. Spleen: Normal in size without focal abnormality. Adrenals/Urinary Tract: Normal adrenal glands. No hydronephrosis. No suspicious renal lesion. Gas in the urinary bladder is likely iatrogenic. Significant artifact in the pelvis from the left hip arthroplasty. Stomach/Bowel: No bowel dilatation or obstruction. No focal bowel inflammation. Normal appearance of the stomach. Vascular/Lymphatic: Aortic atherosclerosis. No enlarged abdominal or pelvic lymph nodes. Reproductive: Limited evaluation of the pelvis due to the hip hardware artifact. The uterus appears to be surgically absent. No evidence for an adnexal mass. Other: Negative for free fluid.  Negative for free air. Musculoskeletal: Left hip arthroplasty is located. Posterior lumbar interbody fusion at L4-L5 with bilateral pedicle screws and  rods and interbody device. No acute bone abnormality. No suspicious osseous lesion. IMPRESSION: 1. Mediastinal and hilar lymphadenopathy of uncertain etiology. The lymphadenopathy is new since 2014. Differential diagnosis includes a lymphoproliferative process and metastatic disease. Sarcoidosis is also in the differential based on the symmetric appearance of the hilar lymphadenopathy and the few peri-fissural and peribronchial nodules. Recommend pulmonary consultation. 2. Several small pulmonary nodules that are indeterminate. Largest nodule measures 7 mm in the right upper lobe. Non-contrast chest CT at 3-6 months is recommended. If the nodules are stable at time of repeat CT, then future CT at 18-24 months (from today's scan) is considered optional for low-risk patients, but is recommended for high-risk patients. This recommendation follows the consensus statement: Guidelines for Management of Incidental Pulmonary Nodules Detected on CT Images: From the Fleischner Society 2017; Radiology 2017; 284:228-243. 3. No evidence for neoplastic or metastatic disease in the abdomen or pelvis. 4. Stable intrahepatic and extrahepatic biliary dilatation is likely associated with the cholecystectomy. 5. Gas within the urinary bladder is likely iatrogenic. 6. Postsurgical changes in the neck. 7. Left thyroid nodule or left thyroid nodules. Nodular disease in left thyroid lobe appears to have increased since 2014. Patient has history of previous biopsy in the left thyroid lobe. Consider follow-up thyroid ultrasound.  Electronically Signed   By: Richarda Overlie M.D.   On: 07/26/2023 13:44   DG Cervical Spine 1 View  Result Date: 07/25/2023 CLINICAL DATA:  Elective surgery. EXAM: DG CERVICAL SPINE - 1 VIEW COMPARISON:  Preoperative imaging. FINDINGS: Five fluoroscopic spot views of the cervical spine obtained in the operating room. There has been prior C5 through C7 fusion. New anterior and posterior fusion at C3-C4 with interbody  spacer. Fluoroscopy time 36.4 seconds. Dose 2.47 mGy. IMPRESSION: Intraoperative fluoroscopy during C3-C4 fusion. Electronically Signed   By: Narda Rutherford M.D.   On: 07/25/2023 17:53   DG C-Arm 1-60 Min-No Report  Result Date: 07/25/2023 Fluoroscopy was utilized by the requesting physician.  No radiographic interpretation.   DG C-Arm 1-60 Min-No Report  Result Date: 07/25/2023 Fluoroscopy was utilized by the requesting physician.  No radiographic interpretation.   DG C-Arm 1-60 Min-No Report  Result Date: 07/25/2023 Fluoroscopy was utilized by the requesting physician.  No radiographic interpretation.    Antibiotics:  Anti-infectives (From admission, onward)    Start     Dose/Rate Route Frequency Ordered Stop   07/25/23 2130  ceFAZolin (ANCEF) IVPB 2g/100 mL premix        2 g 200 mL/hr over 30 Minutes Intravenous Every 8 hours 07/25/23 1742 07/26/23 0407   07/25/23 1059  ceFAZolin (ANCEF) IVPB 2g/100 mL premix        2 g 200 mL/hr over 30 Minutes Intravenous On call to O.R. 07/25/23 1002 07/25/23 1354   07/25/23 1014  ceFAZolin (ANCEF) 2-4 GM/100ML-% IVPB       Note to Pharmacy: Kandice Hams D: cabinet override      07/25/23 1014 07/25/23 1331       Discharge Exam: Blood pressure 132/74, pulse 71, temperature (!) 97.5 F (36.4 C), temperature source Oral, resp. rate 20, height 5\' 4"  (1.626 m), weight 66.7 kg, SpO2 100%. Neurologic: Grossly normal Ambulating and voiding well incision cdi   Discharge Medications:   Allergies as of 07/28/2023   No Known Allergies      Medication List     TAKE these medications    acetaminophen 325 MG tablet Commonly known as: TYLENOL Take 325-650 mg by mouth every 6 (six) hours as needed for moderate pain (pain score 4-6).   calcium carbonate 500 MG chewable tablet Commonly known as: TUMS - dosed in mg elemental calcium Chew 1-2 tablets by mouth daily as needed for indigestion or heartburn.   hydrocortisone cream 1  % Apply 1 Application topically 2 (two) times daily as needed for itching.   methocarbamol 500 MG tablet Commonly known as: ROBAXIN Take 1 tablet (500 mg total) by mouth every 6 (six) hours as needed for muscle spasms.   oxyCODONE-acetaminophen 5-325 MG tablet Commonly known as: PERCOCET/ROXICET Take 1 tablet by mouth every 4 (four) hours as needed for severe pain (pain score 7-10).        Disposition: home   Final Dx: acdf C3-4, posterior laminectomy C3-4 with instrumentation and fusion C3-C5.  Discharge Instructions      Remove dressing in 72 hours   Complete by: As directed    Call MD for:  difficulty breathing, headache or visual disturbances   Complete by: As directed    Call MD for:  hives   Complete by: As directed    Call MD for:  persistant dizziness or light-headedness   Complete by: As directed    Call MD for:  persistant nausea and vomiting   Complete by:  As directed    Call MD for:  redness, tenderness, or signs of infection (pain, swelling, redness, odor or green/yellow discharge around incision site)   Complete by: As directed    Call MD for:  severe uncontrolled pain   Complete by: As directed    Call MD for:  temperature >100.4   Complete by: As directed    Diet - low sodium heart healthy   Complete by: As directed    Driving Restrictions   Complete by: As directed    No driving for 2 weeks, no riding in the car for 1 week   Increase activity slowly   Complete by: As directed    Lifting restrictions   Complete by: As directed    No lifting more than 8 lbs        Follow-up Information     Arman Bogus, MD. Call.   Specialty: Neurosurgery Why: As needed, If symptoms worsen Contact information: [address] [place] [address] [phone]                  Signed: Tiana Loft [facility] 07/28/2023, 8:40 AM

## 2023-07-28 NOTE — Progress Notes (Signed)
Patient is discharged from room 3C09 at this time. Alert and in stable condition. IV site d/c'd and instructions read to patient with understanding verbalized and all questions answered. Left unit via wheelchair with all belongings at side. 

## 2023-07-28 NOTE — Care Management (Signed)
Patient with order to DC to home today. Unit staff to provide DME needed for home.   No HH needs identified Patient will have family/ friends provide transportation home. No other TOC needs identified for DC 

## 2023-07-30 LAB — SURGICAL PATHOLOGY

## 2023-07-31 ENCOUNTER — Ambulatory Visit: Payer: Managed Care, Other (non HMO) | Admitting: Student in an Organized Health Care Education/Training Program

## 2023-07-31 ENCOUNTER — Encounter: Payer: Self-pay | Admitting: Student in an Organized Health Care Education/Training Program

## 2023-07-31 VITALS — BP 120/80 | HR 76 | Temp 97.3°F | Ht 64.0 in | Wt 131.4 lb

## 2023-07-31 DIAGNOSIS — R590 Localized enlarged lymph nodes: Secondary | ICD-10-CM | POA: Insufficient documentation

## 2023-07-31 NOTE — Progress Notes (Unsigned)
Synopsis: Referred in *** by Lucky Cowboy, MD  Assessment & Plan:   #Lymphadenopathy  Need EBUS to lymph nodes to rule out infection, lymphoma, leukemia  No follow-ups on file.  I spent *** minutes caring for this patient today, including {EM billing:[id number]}  Raechel Chute, MD [facility] 07/31/2023 4:11 PM    End of visit medications:  No orders of the defined types were placed in this encounter.    Current Outpatient Medications:    acetaminophen (TYLENOL) 325 MG tablet, Take 325-650 mg by mouth every 6 (six) hours as needed for moderate pain (pain score 4-6)., Disp: , Rfl:    calcium carbonate (TUMS - DOSED IN MG ELEMENTAL CALCIUM) 500 MG chewable tablet, Chew 1-2 tablets by mouth daily as needed for indigestion or heartburn., Disp: , Rfl:    hydrocortisone cream 1 %, Apply 1 Application topically 2 (two) times daily as needed for itching., Disp: , Rfl:    methocarbamol (ROBAXIN) 500 MG tablet, Take 1 tablet (500 mg total) by mouth every 6 (six) hours as needed for muscle spasms., Disp: 45 tablet, Rfl: 0   oxyCODONE-acetaminophen (PERCOCET/ROXICET) 5-325 MG tablet, Take 1 tablet by mouth every 4 (four) hours as needed for severe pain (pain score 7-10)., Disp: 30 tablet, Rfl: 0   Subjective:   PATIENT ID: Gabrielle Duncan GENDER: female DOB: Mar 01, 1956, MRN: [id number]  Chief Complaint  Patient presents with   Consult    Nodule    HPI ***  Ancillary information including prior medications, full medical/surgical/family/social histories, and PFTs (when available) are listed below and have been reviewed.   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   Left thyroid nodule 03/31/2013   Benign per thyroid biopsy in 2014   Mass of chest wall 12/2012   tender to the touch   Seasonal allergies      Family History  Problem Relation Age of Onset   Dementia Mother    CAD Mother 56       triple bypass   Thyroid cancer Mother    Pancreatitis Father    Diabetes Father    Heart disease Father    Thyroid disease Maternal Grandmother    Goiter Maternal Grandmother    Lung cancer Maternal Grandfather 64       smoker   Ovarian cancer Paternal Grandmother 1   Cancer Paternal Grandfather        Unknown origin with mets   Breast cancer Cousin    Breast cancer Cousin    Breast cancer Maternal Aunt        x3   Asthma Daughter    Thyroid disease Other        several maternal relatives     Past Surgical History:  Procedure Laterality Date   ABDOMINAL HYSTERECTOMY  1997   complete   ANTERIOR CERVICAL DECOMP/DISCECTOMY FUSION  08/08/2012   Procedure: ANTERIOR CERVICAL DECOMPRESSION/DISCECTOMY FUSION 2 LEVEL/HARDWARE REMOVAL;  Surgeon: Tia Alert, MD;  Location: [facility];  Service: Neurosurgery;  Laterality: N/A;  anterior cervical five-six to six seven decompression fusion with removal plate four-five   ANTERIOR CERVICAL DECOMP/DISCECTOMY FUSION N/A 07/25/2023   Procedure: Anterior Cervical Decompression/Discectomy Fusion - Cervical Three-Cervical Four;  Surgeon: Arman Bogus, MD;  Location: [facility];  Service: Neurosurgery;  Laterality: N/A;   CERVICAL FUSION  2005   CHOLECYSTECTOMY  1997   EYE SURGERY     for dry eye syndrome   FRACTURE SURGERY Left 2024   femur,hip   HEMORRHOID SURGERY N/A 09/02/2013   Procedure: HEMORRHOIDECTOMY;  Surgeon: Clovis Pu. Cornett, MD;   Location: [facility];  Service: General;  Laterality: N/A;   LUMBAR FUSION  01/01/2013   L4-5   LUMBAR LAMINECTOMY/DECOMPRESSION MICRODISCECTOMY  01/01/2013   L4-5   MASS EXCISION N/A 01/29/2013   Procedure: EXCISION of chest wall mass 8 cm;  Surgeon: Almond Lint, MD;  Location: [facility];  Service: General;  Laterality: N/A;   POSTERIOR CERVICAL FUSION/FORAMINOTOMY N/A 07/25/2023   Procedure: Cervial Laminectomy Cervical Three-Cervical Four With Lateral Mass Fusion/ Fixation;  Surgeon: Arman Bogus, MD;  Location: [facility];  Service: Neurosurgery;  Laterality: N/A;   REDUCTION MAMMAPLASTY Bilateral    SHOULDER SURGERY  03/2012   debridement and partial clavical removal   TOTAL SHOULDER ARTHROPLASTY  09/24/2012   Procedure: TOTAL SHOULDER ARTHROPLASTY;  Surgeon: Eulas Post, MD;  Location: [facility];  Service: Orthopedics;  Laterality: Right;   TOTAL SHOULDER ARTHROPLASTY Left 02/18/2020   Dr. Dion Saucier    Social History   Socioeconomic History   Marital status: Divorced    Spouse name: Not on file   Number of children: 2   Years of education: Not on file   Highest education level: Not on file  Occupational History   Not on file  Tobacco Use   Smoking status: Never   Smokeless tobacco: Never  Vaping Use   Vaping status: Never Used  Substance and Sexual Activity   Alcohol use: No   Drug use: No   Sexual activity: Not Currently  Other Topics Concern   Not on file  Social History Narrative   Not on file   Social Determinants of Health   Financial Resource Strain: Not on file  Food Insecurity: No Food Insecurity (07/28/2023)   Hunger Vital Sign    Worried About Running Out of Food in the Last Year: Never true    Ran Out of Food in the Last Year: Never true  Transportation Needs: No Transportation Needs (07/28/2023)   PRAPARE - Administrator, Civil Service (Medical): No    Lack of Transportation (Non-Medical): No  Physical  Activity: Insufficiently Active (06/04/2019)   Exercise Vital Sign    Days of Exercise per Week: 2 days    Minutes of Exercise per Session: 60 min  Stress: No Stress Concern Present (06/04/2019)   [employer] of Occupational Health - Occupational Stress Questionnaire    Feeling of Stress : Only a little  Social Connections: Not on file  Intimate Partner Violence: Not At Risk (07/28/2023)   Humiliation, Afraid, Rape, and Kick questionnaire    Fear of Current or Ex-Partner: No    Emotionally Abused: No    Physically Abused:  No    Sexually Abused: No     No Known Allergies   CBC    Component Value Date/Time   WBC 5.6 07/25/2023 1004   RBC 4.57 07/25/2023 1004   HGB 12.2 07/25/2023 1004   HCT 38.5 07/25/2023 1004   PLT 288 07/25/2023 1004   MCV 84.2 07/25/2023 1004   MCH 26.7 07/25/2023 1004   MCHC 31.7 07/25/2023 1004   RDW 15.8 (H) 07/25/2023 1004   LYMPHSABS 1,204 05/04/2023 0957   MONOABS 450 05/07/2017 1020   EOSABS 287 05/04/2023 0957   BASOSABS 42 05/04/2023 0957    Pulmonary Functions Testing Results:     No data to display          Outpatient Medications Prior to Visit  Medication Sig Dispense Refill   acetaminophen (TYLENOL) 325 MG tablet Take 325-650 mg by mouth every 6 (six) hours as needed for moderate pain (pain score 4-6).     calcium carbonate (TUMS - DOSED IN MG ELEMENTAL CALCIUM) 500 MG chewable tablet Chew 1-2 tablets by mouth daily as needed for indigestion or heartburn.     hydrocortisone cream 1 % Apply 1 Application topically 2 (two) times daily as needed for itching.     methocarbamol (ROBAXIN) 500 MG tablet Take 1 tablet (500 mg total) by mouth every 6 (six) hours as needed for muscle spasms. 45 tablet 0   oxyCODONE-acetaminophen (PERCOCET/ROXICET) 5-325 MG tablet Take 1 tablet by mouth every 4 (four) hours as needed for severe pain (pain score 7-10). 30 tablet 0   No facility-administered medications prior to visit.

## 2023-07-31 NOTE — H&P (View-Only) (Signed)
Assessment & Plan:   #Mediastinal Lymphadenopathy  Pleasant 67 year old female who presents to clinic for the evaluation of hilar and mediastinal lymphadenopathy. 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 Surgical pathology showed giant cell type non-necrotizing granulomas in addition to bone with reactive changes in the resected lesion. While no malignancy is seen, lymphoma/leukemia is not fully ruled out and remains on the differential in addition to infection as well as sarcoidosis. I will plan for EBUS with TBNA of the hilar and mediastinal lymph nodes to be sent for cytology, flow cytometry (leukemia/lymphoma and CD4:CD8 ratio), and cultures (bacterial, fungal, AFB). Procedure was discussed with the patient alongside risks and benefits, she agrees to proceed.  -proceed with EBUS  No follow-ups on file.  I spent 46 minutes caring for this patient today, including preparing to see the patient, obtaining a medical history , reviewing a separately obtained history, performing a medically appropriate examination and/or evaluation, counseling and educating the patient/family/caregiver, ordering medications, tests, or procedures, documenting clinical information in the electronic health record, and independently interpreting results (not separately reported/billed) and communicating results to the patient/family/caregiver  Gabrielle Chute, MD [facility]   End of visit medications:  No orders of the defined types were placed in this encounter.    Current Outpatient Medications:    acetaminophen (TYLENOL) 325 MG tablet, Take 325-650 mg by mouth every 6 (six) hours as needed for moderate pain (pain score 4-6)., Disp: , Rfl:    calcium carbonate (TUMS - DOSED IN MG ELEMENTAL CALCIUM) 500 MG chewable tablet, Chew 1-2 tablets by mouth daily as needed for indigestion or heartburn., Disp: , Rfl:    hydrocortisone cream 1 %,  Apply 1 Application topically 2 (two) times daily as needed for itching., Disp: , Rfl:    methocarbamol (ROBAXIN) 500 MG tablet, Take 1 tablet (500 mg total) by mouth every 6 (six) hours as needed for muscle spasms., Disp: 45 tablet, Rfl: 0   oxyCODONE-acetaminophen (PERCOCET/ROXICET) 5-325 MG tablet, Take 1 tablet by mouth every 4 (four) hours as needed for severe pain (pain score 7-10)., Disp: 30 tablet, Rfl: 0   Subjective:   PATIENT ID: Gabrielle Duncan GENDER: female DOB: September 09, 1956, MRN: [id number]  Chief Complaint  Patient presents with   Consult    Nodule    HPI  Patient is a pleasant 66 year old female with a recent admission to [facility] hospital for definitive management of cervical myelopathy via anterior and posterior  approach decompressive discectomy, arthrodesis, plating, laminectomy, mass resection, and fixation.   Patient had initially presented to neurosurgery (Dr. Yetta Barre) with chief complaint of neck pain. Imaging noted for spinal stenosis with C3-C4 subluxation and abnormal signal within posterior elements at C3 with epidural enhancing process. She presented for ACDF. Intraoperatively she was found to have osteophyte growth that was removed in addition to disc material. There was also a fair amount of soft gelatinous mass between C3 and C4 that was resected and sent for frozen path (negative intra-op). The surgical pathology on this eventually showed large cell granulomas. Patient underwent a CT scan of the chest that was notable for hilar and mediastinal lymphadenopathy. She is referred to pulmonary for evaluation and consideration of EBUS.  Today, she is in her usual state of health and in great spirit. She has no complaints. Denies any fevers, chills, night sweats, shortness of breath, or cough. She does report around 30 lbs  of unintentional weight loss. Denies any rashes.  Ancillary information including prior medications, full medical/surgical/family/social histories, and PFTs  (when available) are listed below and have been reviewed.   Review of Systems  Constitutional:  Positive for weight loss. Negative for chills, fever and malaise/fatigue.  Respiratory:  Negative for cough, hemoptysis, sputum production, shortness of breath and wheezing.   Cardiovascular:  Negative for chest pain and palpitations.     Objective:   Vitals:   07/31/23 1544  BP: 120/80  Pulse: 76  Temp: (!) 97.3 F (36.3 C)  SpO2: 100%  Weight: 131 lb 6.4 oz (59.6 kg)  Height: 5\' 4"  (1.626 m)   100% on RA BMI Readings from Last 3 Encounters:  07/31/23 22.55 kg/m  07/25/23 25.23 kg/m  05/04/23 26.01 kg/m   Wt Readings from Last 3 Encounters:  07/31/23 131 lb 6.4 oz (59.6 kg)  07/25/23 147 lb (66.7 kg)  05/04/23 148 lb (67.1 kg)    Physical Exam Constitutional:      Appearance: Normal appearance.  Cardiovascular:     Rate and Rhythm: Normal rate and regular rhythm.     Pulses: Normal pulses.     Heart sounds: Normal heart sounds.  Pulmonary:     Effort: Pulmonary effort is normal.     Breath sounds: Normal breath sounds.  Abdominal:     Palpations: Abdomen is soft.  Musculoskeletal:     Right lower leg: No edema.     Left lower leg: No edema.  Neurological:     Mental Status: She is alert.       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   Left thyroid nodule 03/31/2013   Benign per thyroid biopsy in 2014   Mass of chest wall 12/2012   tender to the touch   Seasonal allergies      Family History  Problem Relation Age of Onset   Dementia Mother    CAD Mother 39       triple bypass   Thyroid cancer Mother    Pancreatitis Father    Diabetes Father    Heart disease Father    Thyroid disease Maternal Grandmother    Goiter Maternal Grandmother    Lung cancer Maternal Grandfather 19       smoker   Ovarian cancer Paternal Grandmother 51   Cancer  Paternal Grandfather        Unknown origin with mets   Breast cancer Cousin    Breast cancer Cousin    Breast cancer Maternal Aunt        x3   Asthma Daughter    Thyroid disease Other        several maternal relatives     Past Surgical History:  Procedure Laterality Date   ABDOMINAL HYSTERECTOMY  1997   complete   ANTERIOR CERVICAL DECOMP/DISCECTOMY FUSION  08/08/2012   Procedure: ANTERIOR CERVICAL DECOMPRESSION/DISCECTOMY FUSION 2 LEVEL/HARDWARE REMOVAL;  Surgeon: Tia Alert, MD;  Location: [facility];  Service: Neurosurgery;  Laterality: N/A;  anterior cervical five-six to six seven decompression fusion with removal plate four-five   ANTERIOR CERVICAL DECOMP/DISCECTOMY FUSION N/A 07/25/2023   Procedure: Anterior Cervical Decompression/Discectomy Fusion - Cervical Three-Cervical Four;  Surgeon: Arman Bogus, MD;  Location: [facility] OR;  Service: Neurosurgery;  Laterality: N/A;   CERVICAL FUSION  2005   CHOLECYSTECTOMY  1997  EYE SURGERY     for dry eye syndrome   FRACTURE SURGERY Left 2024   femur,hip   HEMORRHOID SURGERY N/A 09/02/2013   Procedure: HEMORRHOIDECTOMY;  Surgeon: Clovis Pu. Cornett, MD;  Location: [facility];  Service: General;  Laterality: N/A;   LUMBAR FUSION  01/01/2013   L4-5   LUMBAR LAMINECTOMY/DECOMPRESSION MICRODISCECTOMY  01/01/2013   L4-5   MASS EXCISION N/A 01/29/2013   Procedure: EXCISION of chest wall mass 8 cm;  Surgeon: Almond Lint, MD;  Location: [facility];  Service: General;  Laterality: N/A;   POSTERIOR CERVICAL FUSION/FORAMINOTOMY N/A 07/25/2023   Procedure: Cervial Laminectomy Cervical Three-Cervical Four With Lateral Mass Fusion/ Fixation;  Surgeon: Arman Bogus, MD;  Location: [facility] OR;  Service: Neurosurgery;  Laterality: N/A;   REDUCTION MAMMAPLASTY Bilateral    SHOULDER SURGERY  03/2012   debridement and partial clavical removal   TOTAL SHOULDER ARTHROPLASTY  09/24/2012   Procedure: TOTAL SHOULDER  ARTHROPLASTY;  Surgeon: Eulas Post, MD;  Location: [facility];  Service: Orthopedics;  Laterality: Right;   TOTAL SHOULDER ARTHROPLASTY Left 02/18/2020   Dr. Dion Saucier    Social History   Socioeconomic History   Marital status: Divorced    Spouse name: Not on file   Number of children: 2   Years of education: Not on file   Highest education level: Not on file  Occupational History   Not on file  Tobacco Use   Smoking status: Never   Smokeless tobacco: Never  Vaping Use   Vaping status: Never Used  Substance and Sexual Activity   Alcohol use: No   Drug use: No   Sexual activity: Not Currently  Other Topics Concern   Not on file  Social History Narrative   Not on file   Social Determinants of Health   Financial Resource Strain: Not on file  Food Insecurity: No Food Insecurity (07/28/2023)   Hunger Vital Sign    Worried About Running Out of Food in the Last Year: Never true    Ran Out of Food in the Last Year: Never true  Transportation Needs: No Transportation Needs (07/28/2023)   PRAPARE - Administrator, Civil Service (Medical): No    Lack of Transportation (Non-Medical): No  Physical Activity: Insufficiently Active (06/04/2019)   Exercise Vital Sign    Days of Exercise per Week: 2 days    Minutes of Exercise per Session: 60 min  Stress: No Stress Concern Present (06/04/2019)   [employer] of Occupational Health - Occupational Stress Questionnaire    Feeling of Stress : Only a little  Social Connections: Not on file  Intimate Partner Violence: Not At Risk (07/28/2023)   Humiliation, Afraid, Rape, and Kick questionnaire    Fear of Current or Ex-Partner: No    Emotionally Abused: No    Physically Abused: No    Sexually Abused: No     No Known Allergies   CBC    Component Value Date/Time   WBC 5.6 07/25/2023 1004   RBC 4.57 07/25/2023 1004   HGB 12.2 07/25/2023 1004   HCT 38.5 07/25/2023 1004   PLT 288 07/25/2023 1004   MCV 84.2 07/25/2023  1004   MCH 26.7 07/25/2023 1004   MCHC 31.7 07/25/2023 1004   RDW 15.8 (H) 07/25/2023 1004   LYMPHSABS 1,204 05/04/2023 0957   MONOABS 450 05/07/2017 1020   EOSABS 287 05/04/2023 0957   BASOSABS 42 05/04/2023 0957    Pulmonary Functions Testing  Results:     No data to display          Outpatient Medications Prior to Visit  Medication Sig Dispense Refill   acetaminophen (TYLENOL) 325 MG tablet Take 325-650 mg by mouth every 6 (six) hours as needed for moderate pain (pain score 4-6).     calcium carbonate (TUMS - DOSED IN MG ELEMENTAL CALCIUM) 500 MG chewable tablet Chew 1-2 tablets by mouth daily as needed for indigestion or heartburn.     hydrocortisone cream 1 % Apply 1 Application topically 2 (two) times daily as needed for itching.     methocarbamol (ROBAXIN) 500 MG tablet Take 1 tablet (500 mg total) by mouth every 6 (six) hours as needed for muscle spasms. 45 tablet 0   oxyCODONE-acetaminophen (PERCOCET/ROXICET) 5-325 MG tablet Take 1 tablet by mouth every 4 (four) hours as needed for severe pain (pain score 7-10). 30 tablet 0   No facility-administered medications prior to visit.

## 2023-08-01 ENCOUNTER — Telehealth: Payer: Self-pay

## 2023-08-01 NOTE — Telephone Encounter (Signed)
Received below message from Dr. Aundria Rud via epic secure chat-- hi team - can you please call Gabrielle Duncan to let her know that we will do her procedure next week, 11/7, at [facility]. This will be an EBUS    Patient is aware of date/time and location. She voiced her understanding.

## 2023-08-07 ENCOUNTER — Other Ambulatory Visit: Payer: Self-pay

## 2023-08-07 ENCOUNTER — Encounter (HOSPITAL_COMMUNITY): Payer: Self-pay | Admitting: Student in an Organized Health Care Education/Training Program

## 2023-08-07 NOTE — Progress Notes (Signed)
PCP - Lucky Cowboy, MD  Cardiologist -   PPM/ICD - denies Device Orders - n/a Rep Notified - n/a  Chest x-ray - Chest CT, abdomen, Pelvis EKG - 05-04-23 Stress Test -  ECHO -  Cardiac Cath -   DM- denies  Blood Thinner Instructions: denies Aspirin Instructions: n/a  ERAS Protcol - NPO per patient  COVID TEST- na  Anesthesia review: no  Patient verbally denies any shortness of breath, fever, cough and chest pain during phone call   -------------  SDW INSTRUCTIONS given:  Your procedure is scheduled on August 09, 2023.  Report to [facility] Main Entrance "A" at 5:30 A.M., and check in at the Admitting office.  Call this number if you have problems the morning of surgery:  [phone]   Remember:  Do not eat  or drink after midnight the night before your surgery      Take these medicines the morning of surgery with A SIP OF WATER   oxyCODONE-acetaminophen (PERCOCET/ROXICE  methocarbamol (ROBAXIN)  acetaminophen (TYLENOL)    As of today, STOP taking any Aspirin (unless otherwise instructed by your surgeon) Aleve, Naproxen, Ibuprofen, Motrin, Advil, Goody's, BC's, all herbal medications, fish oil, and all vitamins.                      Do not wear jewelry, make up, or nail polish            Do not wear lotions, powders, perfumes/colognes, or deodorant.            Do not shave 48 hours prior to surgery.  Men may shave face and neck.            Do not bring valuables to the hospital.            [facility] is not responsible for any belongings or valuables.  Do NOT Smoke (Tobacco/Vaping) 24 hours prior to your procedure If you use a CPAP at night, you may bring all equipment for your overnight stay.   Contacts, glasses, dentures or bridgework may not be worn into surgery.      For patients admitted to the hospital, discharge time will be determined by your treatment team.   Patients discharged the day of surgery will not be allowed to drive home, and someone  needs to stay with them for 24 hours.    Special instructions:   [place]- Preparing For Surgery  Before surgery, you can play an important role. Because skin is not sterile, your skin needs to be as free of germs as possible. You can reduce the number of germs on your skin by washing with CHG (chlorahexidine gluconate) Soap before surgery.  CHG is an antiseptic cleaner which kills germs and bonds with the skin to continue killing germs even after washing.    Oral Hygiene is also important to reduce your risk of infection.  Remember - BRUSH YOUR TEETH THE MORNING OF SURGERY WITH YOUR REGULAR TOOTHPASTE  Please do not use if you have an allergy to CHG or antibacterial soaps. If your skin becomes reddened/irritated stop using the CHG.  Do not shave (including legs and underarms) for at least 48 hours prior to first CHG shower. It is OK to shave your face.  Please follow these instructions carefully.   Shower the NIGHT BEFORE SURGERY and the MORNING OF SURGERY with DIAL Soap.   Pat yourself dry with a CLEAN TOWEL.  Wear CLEAN PAJAMAS to bed the night  before surgery  Place CLEAN SHEETS on your bed the night of your first shower and DO NOT SLEEP WITH PETS.   Day of Surgery: Please shower morning of surgery  Wear Clean/Comfortable clothing the morning of surgery Do not apply any deodorants/lotions.   Remember to brush your teeth WITH YOUR REGULAR TOOTHPASTE.   Questions were answered. Patient verbalized understanding of instructions.

## 2023-08-08 NOTE — Anesthesia Preprocedure Evaluation (Signed)
Anesthesia Evaluation  Patient identified by MRN, date of birth, ID band Patient awake    Reviewed: Allergy & Precautions, NPO status , Patient's Chart, lab work & pertinent test results  Airway Mallampati: I  TM Distance: >3 FB Neck ROM: Full    Dental no notable dental hx. (+) Teeth Intact, Dental Advisory Given   Pulmonary neg pulmonary ROS   Pulmonary exam normal breath sounds clear to auscultation       Cardiovascular hypertension, Normal cardiovascular exam Rhythm:Regular Rate:Normal     Neuro/Psych  PSYCHIATRIC DISORDERS  Depression    negative neurological ROS     GI/Hepatic negative GI ROS, Neg liver ROS,,,  Endo/Other  negative endocrine ROS    Renal/GU negative Renal ROS  negative genitourinary   Musculoskeletal  (+) Arthritis ,    Abdominal   Peds  Hematology negative hematology ROS (+)   Anesthesia Other Findings Mediastinal lymphadenopathy  Reproductive/Obstetrics                             Anesthesia Physical Anesthesia Plan  ASA: 3  Anesthesia Plan: General   Post-op Pain Management: Tylenol PO (pre-op)*   Induction: Intravenous  PONV Risk Score and Plan: 3 and Midazolam, Dexamethasone and Ondansetron  Airway Management Planned: Oral ETT  Additional Equipment:   Intra-op Plan:   Post-operative Plan: Extubation in OR  Informed Consent: I have reviewed the patients History and Physical, chart, labs and discussed the procedure including the risks, benefits and alternatives for the proposed anesthesia with the patient or authorized representative who has indicated his/her understanding and acceptance.     Dental advisory given  Plan Discussed with: CRNA  Anesthesia Plan Comments:        Anesthesia Quick Evaluation

## 2023-08-09 ENCOUNTER — Ambulatory Visit (HOSPITAL_COMMUNITY): Payer: Managed Care, Other (non HMO)

## 2023-08-09 ENCOUNTER — Ambulatory Visit (HOSPITAL_COMMUNITY)
Admission: RE | Admit: 2023-08-09 | Discharge: 2023-08-09 | Disposition: A | Discharge status: Home / Self Care | Payer: Managed Care, Other (non HMO) | Attending: Student in an Organized Health Care Education/Training Program | Admitting: Student in an Organized Health Care Education/Training Program

## 2023-08-09 ENCOUNTER — Other Ambulatory Visit: Payer: Self-pay

## 2023-08-09 ENCOUNTER — Encounter (HOSPITAL_COMMUNITY)
Admission: RE | Disposition: A | Discharge status: Home / Self Care | Payer: Self-pay | Source: Home / Self Care | Attending: Student in an Organized Health Care Education/Training Program

## 2023-08-09 ENCOUNTER — Ambulatory Visit (HOSPITAL_BASED_OUTPATIENT_CLINIC_OR_DEPARTMENT_OTHER): Payer: Managed Care, Other (non HMO)

## 2023-08-09 ENCOUNTER — Encounter (HOSPITAL_COMMUNITY): Payer: Self-pay | Admitting: Student in an Organized Health Care Education/Training Program

## 2023-08-09 DIAGNOSIS — R590 Localized enlarged lymph nodes: Secondary | ICD-10-CM | POA: Insufficient documentation

## 2023-08-09 DIAGNOSIS — R634 Abnormal weight loss: Secondary | ICD-10-CM | POA: Diagnosis not present

## 2023-08-09 DIAGNOSIS — Z90710 Acquired absence of both cervix and uterus: Secondary | ICD-10-CM | POA: Diagnosis not present

## 2023-08-09 DIAGNOSIS — R599 Enlarged lymph nodes, unspecified: Secondary | ICD-10-CM | POA: Diagnosis not present

## 2023-08-09 DIAGNOSIS — Z9049 Acquired absence of other specified parts of digestive tract: Secondary | ICD-10-CM | POA: Insufficient documentation

## 2023-08-09 HISTORY — PX: VIDEO BRONCHOSCOPY WITH ENDOBRONCHIAL ULTRASOUND: SHX6177

## 2023-08-09 HISTORY — PX: BRONCHIAL NEEDLE ASPIRATION BIOPSY: SHX5106

## 2023-08-09 SURGERY — BRONCHOSCOPY, WITH EBUS
Anesthesia: General

## 2023-08-09 MED ORDER — DROPERIDOL 2.5 MG/ML IJ SOLN: 0.6250 mg | Freq: Once | INTRAMUSCULAR | Status: DC | PRN

## 2023-08-09 MED ORDER — LIDOCAINE 2% (20 MG/ML) 5 ML SYRINGE
INTRAMUSCULAR | Status: DC | PRN
  Administered 2023-08-09: 80 mg via INTRAVENOUS

## 2023-08-09 MED ORDER — ONDANSETRON HCL 4 MG/2ML IJ SOLN
INTRAMUSCULAR | Status: DC | PRN
  Administered 2023-08-09: 4 mg via INTRAVENOUS

## 2023-08-09 MED ORDER — CHLORHEXIDINE GLUCONATE 0.12 % MT SOLN
OROMUCOSAL | Status: AC
  Filled 2023-08-09: qty 15

## 2023-08-09 MED ORDER — ACETAMINOPHEN 10 MG/ML IV SOLN: 1000.0000 mg | Freq: Once | INTRAVENOUS | Status: DC | PRN

## 2023-08-09 MED ORDER — DEXAMETHASONE SODIUM PHOSPHATE 10 MG/ML IJ SOLN
INTRAMUSCULAR | Status: DC | PRN
  Administered 2023-08-09: 10 mg via INTRAVENOUS

## 2023-08-09 MED ORDER — FENTANYL CITRATE (PF) 100 MCG/2ML IJ SOLN: 25.0000 ug | INTRAMUSCULAR | Status: DC | PRN

## 2023-08-09 MED ORDER — FENTANYL CITRATE (PF) 250 MCG/5ML IJ SOLN
INTRAMUSCULAR | Status: DC | PRN
  Administered 2023-08-09 (×2): 50 ug via INTRAVENOUS

## 2023-08-09 MED ORDER — PROPOFOL 10 MG/ML IV BOLUS
INTRAVENOUS | Status: DC | PRN
  Administered 2023-08-09: 20 mg via INTRAVENOUS
  Administered 2023-08-09: 100 mg via INTRAVENOUS
  Administered 2023-08-09 (×2): 20 mg via INTRAVENOUS

## 2023-08-09 MED ORDER — PROPOFOL 500 MG/50ML IV EMUL
INTRAVENOUS | Status: DC | PRN
  Administered 2023-08-09: 100 ug/kg/min via INTRAVENOUS

## 2023-08-09 MED ORDER — OXYCODONE HCL 5 MG PO TABS: 5.0000 mg | ORAL_TABLET | Freq: Once | ORAL | Status: DC | PRN

## 2023-08-09 MED ORDER — OXYCODONE HCL 5 MG/5ML PO SOLN: 5.0000 mg | Freq: Once | ORAL | Status: DC | PRN

## 2023-08-09 MED ORDER — SUCCINYLCHOLINE CHLORIDE 200 MG/10ML IV SOSY
PREFILLED_SYRINGE | INTRAVENOUS | Status: DC | PRN
  Administered 2023-08-09: 100 mg via INTRAVENOUS

## 2023-08-09 MED ORDER — ROCURONIUM BROMIDE 10 MG/ML (PF) SYRINGE
PREFILLED_SYRINGE | INTRAVENOUS | Status: DC | PRN
  Administered 2023-08-09: 50 mg via INTRAVENOUS
  Administered 2023-08-09: 30 mg via INTRAVENOUS

## 2023-08-09 MED ORDER — PHENYLEPHRINE 80 MCG/ML (10ML) SYRINGE FOR IV PUSH (FOR BLOOD PRESSURE SUPPORT)
PREFILLED_SYRINGE | INTRAVENOUS | Status: DC | PRN
  Administered 2023-08-09 (×3): 80 ug via INTRAVENOUS
  Administered 2023-08-09: 160 ug via INTRAVENOUS
  Administered 2023-08-09: 80 ug via INTRAVENOUS
  Administered 2023-08-09: 160 ug via INTRAVENOUS

## 2023-08-09 MED ORDER — LACTATED RINGERS IV SOLN: INTRAVENOUS | Status: DC | PRN

## 2023-08-09 MED ORDER — SUGAMMADEX SODIUM 200 MG/2ML IV SOLN
INTRAVENOUS | Status: DC | PRN
  Administered 2023-08-09: 200 mg via INTRAVENOUS

## 2023-08-09 MED ORDER — SODIUM CHLORIDE 0.9% FLUSH: 10.0000 mL | Freq: Two times a day (BID) | INTRAVENOUS | Status: DC

## 2023-08-09 MED ORDER — MIDAZOLAM HCL 2 MG/2ML IJ SOLN
INTRAMUSCULAR | Status: DC | PRN
  Administered 2023-08-09: 2 mg via INTRAVENOUS

## 2023-08-09 NOTE — Anesthesia Procedure Notes (Signed)
Procedure Name: Intubation Date/Time: 08/09/2023 7:48 AM  Performed by: Jimmey Ralph, CRNAPre-anesthesia Checklist: Patient identified, Emergency Drugs available, Suction available and Patient being monitored Patient Re-evaluated:Patient Re-evaluated prior to induction Oxygen Delivery Method: Circle system utilized Preoxygenation: Pre-oxygenation with 100% oxygen Induction Type: IV induction Ventilation: Mask ventilation without difficulty Laryngoscope Size: Glidescope and 3 Grade View: Grade I Tube type: Oral Tube size: 8.5 mm Number of attempts: 1 Airway Equipment and Method: Stylet and Oral airway Placement Confirmation: ETT inserted through vocal cords under direct vision, positive ETCO2 and breath sounds checked- equal and bilateral Secured at: 20 cm Tube secured with: Tape Dental Injury: Teeth and Oropharynx as per pre-operative assessment

## 2023-08-09 NOTE — Transfer of Care (Signed)
Immediate Anesthesia Transfer of Care Note  Patient: Gabrielle Duncan  Procedure(s) Performed: VIDEO BRONCHOSCOPY WITH ENDOBRONCHIAL ULTRASOUND BRONCHIAL NEEDLE ASPIRATION BIOPSIES  Patient Location: PACU  Anesthesia Type:General  Level of Consciousness: awake, alert , and oriented  Airway & Oxygen Therapy: Patient Spontanous Breathing and Patient connected to face mask  Post-op Assessment: Report given to RN and Post -op Vital signs reviewed and stable  Post vital signs: Reviewed and stable  Last Vitals:  Vitals Value Taken Time  BP 91/65 08/09/23 0915  Temp 36.9 C 08/09/23 0907  Pulse 70 08/09/23 0927  Resp 16 08/09/23 0927  SpO2 100 % 08/09/23 0927  Vitals shown include unfiled device data.  Last Pain:  Vitals:   08/09/23 0907  TempSrc:   PainSc: 0-No pain         Complications: No notable events documented.

## 2023-08-09 NOTE — Interval H&P Note (Signed)
S: Feels well, no shortness of breath, no chest pain O: Vitals:   08/09/23 0546  BP: 123/80  Pulse: 78  Resp: 18  Temp: 97.7 F (36.5 C)  SpO2: 100%   General: Well-appearing and in no distress. Well-nourished Eyes: Anicteric, no conjunctival pallor HEENT: Mucous membranes moist, no evidence of postnasal drip Respiratory: Trachea is midline, no respiratory distress, good bilateral air entry, no wheezes, rales, or rhonchi Cardiovascular: Heart with regular rate and rhythm, normal S1 and S2, no murmurs, rubs, or gallops Gastrointestinal: Normoactive bowel sounds, soft and nontender Neuro: Alert and oriented, no gross focal deficits  A/P: 67 year old female with mediastinal and hilar lymphadenopathy. Previous biopsy of a cervical lesion showed granulomas. Plan for EBUS with TBNA to the lymph node stations to rule out lymphoma, sarcoidosis, and infection. Patient is appropriate for the procedure.  Raechel Chute, MD  Pulmonary Critical Care 08/09/2023 7:34 AM

## 2023-08-09 NOTE — Anesthesia Postprocedure Evaluation (Signed)
Anesthesia Post Note  Patient: JENNICE RENEGAR  Procedure(s) Performed: VIDEO BRONCHOSCOPY WITH ENDOBRONCHIAL ULTRASOUND BRONCHIAL NEEDLE ASPIRATION BIOPSIES     Patient location during evaluation: PACU Anesthesia Type: General Level of consciousness: awake and alert Pain management: pain level controlled Vital Signs Assessment: post-procedure vital signs reviewed and stable Respiratory status: spontaneous breathing, nonlabored ventilation, respiratory function stable and patient connected to nasal cannula oxygen Cardiovascular status: blood pressure returned to baseline and stable Postop Assessment: no apparent nausea or vomiting Anesthetic complications: no   No notable events documented.  Last Vitals:  Vitals:   08/09/23 1015 08/09/23 1030  BP: 115/69 114/75  Pulse: 66 66  Resp: 16 20  Temp:    SpO2: 99% 100%    Last Pain:  Vitals:   08/09/23 1030  TempSrc:   PainSc: 0-No pain                 [place]

## 2023-08-09 NOTE — Op Note (Signed)
Flexible and EBUS Bronchoscopy Procedure Note  Gabrielle Duncan  [id number]  1956-08-24  Date:08/09/23  Time:8:57 AM   Provider Performing:Jarrid Lienhard   Procedure: Flexible bronchoscopy and EBUS Bronchoscopy  Indication(s) Mediastinal lymphadenopathy  Consent Risks of the procedure as well as the alternatives and risks of each were explained to the patient and/or caregiver.  Consent for the procedure was obtained.  Anesthesia General Anesthesia   Time Out Verified patient identification, verified procedure, site/side was marked, verified correct patient position, special equipment/implants available, medications/allergies/relevant history reviewed, required imaging and test results available.   Sterile Technique Usual hand hygiene, masks, gowns, and gloves were used   Procedure Description Diagnostic bronchoscope advanced through endotracheal tube and into airway.  Airways were examined down to subsegmental level with findings noted below.  Following diagnostic evaluation, the diagnostic bronchoscope was then removed and the EBUS bronchoscope was advanced into airway with stations 7, 11R inferior, and 11L biopsied and sent for slide, cell block, flow cytometry and culture using a 21G Olympus ViziShot 2 Needle. The EBUS bronchoscope was removed after assuring no active bleeding from biopsy site.  Findings: Tracheobronchial tree evaluated to the subsegmental level. No endobronchial lesions noted, normal mucosa. EBUS with TBNA to stations 7, 11R inferior, and 11L.   Complications/Tolerance None; patient tolerated the procedure well. Chest X-ray is needed post procedure.   EBL Minimal   Raechel Chute, MD [facility] 08/09/2023 9:00 AM

## 2023-08-11 LAB — ACID FAST SMEAR (AFB, MYCOBACTERIA): Acid Fast Smear: NEGATIVE

## 2023-08-12 ENCOUNTER — Encounter (HOSPITAL_COMMUNITY): Payer: Self-pay | Admitting: Student in an Organized Health Care Education/Training Program

## 2023-08-12 LAB — ACID FAST SMEAR (AFB, MYCOBACTERIA): Acid Fast Smear: NEGATIVE

## 2023-08-13 ENCOUNTER — Ambulatory Visit: Payer: Managed Care, Other (non HMO) | Admitting: Nurse Practitioner

## 2023-08-14 LAB — AEROBIC/ANAEROBIC CULTURE W GRAM STAIN (SURGICAL/DEEP WOUND)
Culture: NO GROWTH
Culture: NO GROWTH
Gram Stain: NONE SEEN
Gram Stain: NONE SEEN

## 2023-08-14 LAB — SURGICAL PATHOLOGY

## 2023-08-15 LAB — CYTOLOGY - NON PAP

## 2023-08-21 ENCOUNTER — Encounter: Payer: Self-pay | Admitting: Student in an Organized Health Care Education/Training Program

## 2023-08-21 ENCOUNTER — Ambulatory Visit: Payer: Managed Care, Other (non HMO) | Admitting: Student in an Organized Health Care Education/Training Program

## 2023-08-21 ENCOUNTER — Other Ambulatory Visit
Admission: RE | Admit: 2023-08-21 | Discharge: 2023-08-21 | Disposition: A | Discharge status: Home / Self Care | Payer: Managed Care, Other (non HMO) | Source: Ambulatory Visit | Attending: Student in an Organized Health Care Education/Training Program | Admitting: Student in an Organized Health Care Education/Training Program

## 2023-08-21 VITALS — BP 114/64 | HR 69 | Temp 97.6°F | Ht 65.0 in | Wt 129.6 lb

## 2023-08-21 DIAGNOSIS — D862 Sarcoidosis of lung with sarcoidosis of lymph nodes: Secondary | ICD-10-CM | POA: Insufficient documentation

## 2023-08-21 LAB — URINALYSIS, ROUTINE W REFLEX MICROSCOPIC
Bacteria, UA: NONE SEEN
Bilirubin Urine: NEGATIVE
Glucose, UA: NEGATIVE mg/dL
Hgb urine dipstick: NEGATIVE
Ketones, ur: 5 mg/dL — AB
Nitrite: NEGATIVE
Protein, ur: NEGATIVE mg/dL
RBC / HPF: 0 RBC/hpf (ref 0–5)
Specific Gravity, Urine: 1.036 — ABNORMAL HIGH (ref 1.005–1.030)
pH: 5 (ref 5.0–8.0)

## 2023-08-21 LAB — CBC WITH DIFFERENTIAL/PLATELET
Abs Immature Granulocytes: 0.02 10*3/uL (ref 0.00–0.07)
Basophils Absolute: 0 10*3/uL (ref 0.0–0.1)
Basophils Relative: 1 %
Eosinophils Absolute: 0.2 10*3/uL (ref 0.0–0.5)
Eosinophils Relative: 3 %
HCT: 37.6 % (ref 36.0–46.0)
Hemoglobin: 12.5 g/dL (ref 12.0–15.0)
Immature Granulocytes: 0 %
Lymphocytes Relative: 24 %
Lymphs Abs: 1.5 10*3/uL (ref 0.7–4.0)
MCH: 28.3 pg (ref 26.0–34.0)
MCHC: 33.2 g/dL (ref 30.0–36.0)
MCV: 85.3 fL (ref 80.0–100.0)
Monocytes Absolute: 0.4 10*3/uL (ref 0.1–1.0)
Monocytes Relative: 7 %
Neutro Abs: 4.2 10*3/uL (ref 1.7–7.7)
Neutrophils Relative %: 65 %
Platelets: 325 10*3/uL (ref 150–400)
RBC: 4.41 MIL/uL (ref 3.87–5.11)
RDW: 15.5 % (ref 11.5–15.5)
WBC: 6.4 10*3/uL (ref 4.0–10.5)
nRBC: 0 % (ref 0.0–0.2)

## 2023-08-21 LAB — COMPREHENSIVE METABOLIC PANEL
ALT: 16 U/L (ref 0–44)
AST: 18 U/L (ref 15–41)
Albumin: 4.3 g/dL (ref 3.5–5.0)
Alkaline Phosphatase: 74 U/L (ref 38–126)
Anion gap: 10 (ref 5–15)
BUN: 15 mg/dL (ref 8–23)
CO2: 23 mmol/L (ref 22–32)
Calcium: 9.1 mg/dL (ref 8.9–10.3)
Chloride: 103 mmol/L (ref 98–111)
Creatinine, Ser: 0.65 mg/dL (ref 0.44–1.00)
GFR, Estimated: >60 mL/min (ref >60)
Glucose, Bld: 104 mg/dL — ABNORMAL HIGH (ref 70–99)
Potassium: 3.8 mmol/L (ref 3.5–5.1)
Sodium: 136 mmol/L (ref 135–145)
Total Bilirubin: 0.6 mg/dL (ref <1.2)
Total Protein: 7.3 g/dL (ref 6.5–8.1)

## 2023-08-21 LAB — VITAMIN D 25 HYDROXY (VIT D DEFICIENCY, FRACTURES): Vit D, 25-Hydroxy: 46.01 ng/mL (ref 30–100)

## 2023-08-21 NOTE — Progress Notes (Signed)
Synopsis: Referred in for lymphadenopathy by Lucky Cowboy, MD  Assessment & Plan:   #Stage I Sarcoidosis  Patient is presenting for the follow-up mediastinal and hilar lymphadenopathy status post EBUS with TBNA.  The biopsy is showing granulomatosis inflammation with flow cytometry not showing any abnormal or clonal B or T-cell populations.  The CD4:CD8 ratio is markedly increased at 10:1 and is overall consistent with a diagnosis of sarcoidosis.  This is consistent with the previous pathology from the lymph node noted during her next surgery.  Cultures were sent from the biopsy including AFB and fungal cultures and remain no growth to date.  I discussed all these findings with the patient and explained that while we do not generally know what causes of sarcoidosis, management is conservative unless there is end organ involvement, which she does not have any signs of.  Other possibilities on the differential include a sarcoid like reaction secondary to the immunosuppression she has previously received.  She reports stopping all immunosuppression a few months ago. Furthermore, review of chest imaging does not note any involvement of her lungs by the sarcoidosis aside from the lymphadenopathy putting her at stage I.  She has an upcoming appointment with ophthalmology.  For workup, I will obtain a pulmonary function test as well as a battery of blood tests (CBC, CMP, ACE level, 25-vitamin D, 1, 25-vitamin D, TSH previously tested normal), urinalysis, and an echocardiogram.  She did mention some rashes over her skin that she thinks are secondary to Psoriatec arthritis and I asked that she be evaluated by dermatology for a biopsy should these recur.  These rashes could be secondary to sarcoidosis (erythema nodosum like sarcoid lesion).  -Pulmonary function test -Echocardiogram -Blood work as above -Urinalysis -Ophthalmology evaluation -follow up FNA cultures until finalized  Return in about 6  months (around 02/18/2024).  I spent 33 minutes caring for this patient today, including preparing to see the patient, obtaining a medical history , reviewing a separately obtained history, performing a medically appropriate examination and/or evaluation, counseling and educating the patient/family/caregiver, ordering medications, tests, or procedures, documenting clinical information in the electronic health record, and independently interpreting results (not separately reported/billed) and communicating results to the patient/family/caregiver.  This visit as part of large annual follow-up for chronic medical condition.  Raechel Chute, MD [facility] 08/21/2023 9:25 AM    End of visit medications:  No orders of the defined types were placed in this encounter.    Current Outpatient Medications:    acetaminophen (TYLENOL) 325 MG tablet, Take 325-650 mg by mouth every 6 (six) hours as needed for moderate pain (pain score 4-6)., Disp: , Rfl:    calcium carbonate (TUMS - DOSED IN MG ELEMENTAL CALCIUM) 500 MG chewable tablet, Chew 1-2 tablets by mouth daily as needed for indigestion or heartburn., Disp: , Rfl:    hydrocortisone cream 1 %, Apply 1 Application topically 2 (two) times daily as needed for itching., Disp: , Rfl:    methocarbamol (ROBAXIN) 500 MG tablet, Take 1 tablet (500 mg total) by mouth every 6 (six) hours as needed for muscle spasms., Disp: 45 tablet, Rfl: 0   oxyCODONE-acetaminophen (PERCOCET/ROXICET) 5-325 MG tablet, Take 1 tablet by mouth every 4 (four) hours as needed for severe pain (pain score 7-10)., Disp: 30 tablet, Rfl: 0   Subjective:   PATIENT ID: Gabrielle Duncan GENDER: female DOB: 09-30-1956, MRN: [id number]  Chief Complaint  Patient presents with   Follow-up    Occasional cough.  No shortness of breath or wheezing.     HPI  Ms. Kotas is a pleasant 67 year old female presenting to clinic for follow-up.    She was recently admitted to [facility] for definitive management of cervical myelopathy via anterior and posterior approach decompressive discectomy, arthrodesis, plating, laminectomy, mass resection, and fixation.  She was noted to have mediastinal lymphadenopathy for which she was referred to me and underwent EBUS on 08/09/2023 and is presenting to discuss results from the biopsy.  Patient had initially presented to neurosurgery (Dr. Yetta Barre) with chief complaint of neck pain. Imaging noted for spinal stenosis with C3-C4 subluxation and abnormal signal within posterior elements at C3 with epidural enhancing process. She presented for ACDF. Intraoperatively she was found to have osteophyte growth that was removed in addition to disc material. There was also a fair amount of soft gelatinous mass between C3 and C4 that was resected and sent for frozen path (negative intra-op). The surgical pathology on this eventually showed large cell granulomas.  Today, she reports feeling her usual state. She feels her appetite is somewhat improved but isn't able to gain weight. She does not have any shortness of breath, cough, sputum production, fevers, or chills.  She is upbeat and in a good mood.  Asked about rashes, she does report some lower and upper extremity rashes that have been ongoing for the past year.  Otherwise denies any worsening in her arthritis.  She tells me she has an appointment coming up with ophthalmology.  Patient reports that history of psoriatic arthritis and rheumatoid arthritis, previously on DMARDs, but currently denies taking any disease modifying agents.  She was previously on methotrexate as well as Ustekinumab, both of which discontinued.  I do not have access to notes from her rheumatologist.  She denies any history of smoking and denies any occupational exposures.  Ancillary information including prior medications, full medical/surgical/family/social histories, and PFTs (when available) are listed below and have  been reviewed.   Review of Systems  Constitutional:  Positive for weight loss. Negative for chills, fever and malaise/fatigue.  Respiratory:  Negative for cough, hemoptysis, sputum production, shortness of breath and wheezing.   Cardiovascular:  Negative for chest pain and palpitations.     Objective:   Vitals:   08/21/23 0907  BP: 114/64  Pulse: 69  Temp: 97.6 F (36.4 C)  TempSrc: Temporal  SpO2: 99%  Weight: 129 lb 9.6 oz (58.8 kg)  Height: 5\' 5"  (1.651 m)   99% on RA BMI Readings from Last 3 Encounters:  08/21/23 21.57 kg/m  08/09/23 21.87 kg/m  07/31/23 22.55 kg/m   Wt Readings from Last 3 Encounters:  08/21/23 129 lb 9.6 oz (58.8 kg)  08/09/23 131 lb 6.3 oz (59.6 kg)  07/31/23 131 lb 6.4 oz (59.6 kg)    Physical Exam Constitutional:      Appearance: Normal appearance.  Cardiovascular:     Rate and Rhythm: Normal rate and regular rhythm.     Pulses: Normal pulses.     Heart sounds: Normal heart sounds.  Pulmonary:     Effort: Pulmonary effort is normal.     Breath sounds: Normal breath sounds.  Abdominal:     Palpations: Abdomen is soft.  Musculoskeletal:     Right lower leg: No edema.     Left lower leg: No edema.  Neurological:     Mental Status: She is alert.       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   Left thyroid nodule 03/31/2013   Benign per thyroid biopsy in 2014   Mass of chest wall 12/2012   tender to the touch   Seasonal allergies      Family History  Problem Relation Age of Onset   Dementia Mother    CAD Mother 95       triple bypass   Thyroid cancer Mother    Pancreatitis Father    Diabetes Father    Heart disease Father    Thyroid disease Maternal Grandmother    Goiter Maternal Grandmother    Lung cancer Maternal Grandfather 108       smoker   Ovarian cancer Paternal Grandmother 79   Cancer Paternal Grandfather         Unknown origin with mets   Breast cancer Cousin    Breast cancer Cousin    Breast cancer Maternal Aunt        x3   Asthma Daughter    Thyroid disease Other        several maternal relatives     Past Surgical History:  Procedure Laterality Date   ABDOMINAL HYSTERECTOMY  1997   complete   ANTERIOR CERVICAL DECOMP/DISCECTOMY FUSION  08/08/2012   Procedure: ANTERIOR CERVICAL DECOMPRESSION/DISCECTOMY FUSION 2 LEVEL/HARDWARE REMOVAL;  Surgeon: Tia Alert, MD;  Location: [facility];  Service: Neurosurgery;  Laterality: N/A;  anterior cervical five-six to six seven decompression fusion with removal plate four-five   ANTERIOR CERVICAL DECOMP/DISCECTOMY FUSION N/A 07/25/2023   Procedure: Anterior Cervical Decompression/Discectomy Fusion - Cervical Three-Cervical Four;  Surgeon: Arman Bogus, MD;  Location: [facility];  Service: Neurosurgery;  Laterality: N/A;   BRONCHIAL NEEDLE ASPIRATION BIOPSY  08/09/2023   Procedure: BRONCHIAL NEEDLE ASPIRATION BIOPSIES;  Surgeon: Raechel Chute, MD;  Location: [facility];  Service: Pulmonary;;   CERVICAL FUSION  2005   CHOLECYSTECTOMY  1997   EYE SURGERY     for dry eye syndrome   FRACTURE SURGERY Left 2024   femur,hip   HEMORRHOID SURGERY N/A 09/02/2013   Procedure: HEMORRHOIDECTOMY;  Surgeon: Clovis Pu. Cornett, MD;  Location: [facility];  Service: General;  Laterality: N/A;   LUMBAR FUSION  01/01/2013   L4-5   LUMBAR LAMINECTOMY/DECOMPRESSION MICRODISCECTOMY  01/01/2013   L4-5   MASS EXCISION N/A 01/29/2013   Procedure: EXCISION of chest wall mass 8 cm;  Surgeon: Almond Lint, MD;  Location: [facility];  Service: General;  Laterality: N/A;   POSTERIOR CERVICAL FUSION/FORAMINOTOMY N/A 07/25/2023   Procedure: Cervial Laminectomy Cervical Three-Cervical Four With Lateral Mass Fusion/ Fixation;  Surgeon: Arman Bogus, MD;  Location: [facility];  Service: Neurosurgery;  Laterality: N/A;   REDUCTION MAMMAPLASTY  Bilateral    SHOULDER SURGERY  03/2012   debridement and partial clavical removal   TOTAL SHOULDER ARTHROPLASTY  09/24/2012   Procedure: TOTAL SHOULDER ARTHROPLASTY;  Surgeon: Eulas Post, MD;  Location: [facility];  Service: Orthopedics;  Laterality: Right;   TOTAL SHOULDER ARTHROPLASTY Left 02/18/2020   Dr. Dion Saucier   VIDEO BRONCHOSCOPY WITH ENDOBRONCHIAL ULTRASOUND N/A 08/09/2023   Procedure: VIDEO BRONCHOSCOPY WITH ENDOBRONCHIAL ULTRASOUND;  Surgeon: Raechel Chute, MD;  Location: [facility];  Service: Pulmonary;  Laterality: N/A;    Social History   Socioeconomic History   Marital status: Divorced    Spouse name: Not on file   Number of children: 2  Years of education: Not on file   Highest education level: Not on file  Occupational History   Not on file  Tobacco Use   Smoking status: Never   Smokeless tobacco: Never  Vaping Use   Vaping status: Never Used  Substance and Sexual Activity   Alcohol use: No   Drug use: No   Sexual activity: Not Currently  Other Topics Concern   Not on file  Social History Narrative   Not on file   Social Determinants of Health   Financial Resource Strain: Not on file  Food Insecurity: No Food Insecurity (07/28/2023)   Hunger Vital Sign    Worried About Running Out of Food in the Last Year: Never true    Ran Out of Food in the Last Year: Never true  Transportation Needs: No Transportation Needs (07/28/2023)   PRAPARE - Administrator, Civil Service (Medical): No    Lack of Transportation (Non-Medical): No  Physical Activity: Insufficiently Active (06/04/2019)   Exercise Vital Sign    Days of Exercise per Week: 2 days    Minutes of Exercise per Session: 60 min  Stress: No Stress Concern Present (06/04/2019)   [employer] of Occupational Health - Occupational Stress Questionnaire    Feeling of Stress : Only a little  Social Connections: Not on file  Intimate Partner Violence: Not At Risk (07/28/2023)   Humiliation,  Afraid, Rape, and Kick questionnaire    Fear of Current or Ex-Partner: No    Emotionally Abused: No    Physically Abused: No    Sexually Abused: No     No Known Allergies   CBC    Component Value Date/Time   WBC 5.6 07/25/2023 1004   RBC 4.57 07/25/2023 1004   HGB 12.2 07/25/2023 1004   HCT 38.5 07/25/2023 1004   PLT 288 07/25/2023 1004   MCV 84.2 07/25/2023 1004   MCH 26.7 07/25/2023 1004   MCHC 31.7 07/25/2023 1004   RDW 15.8 (H) 07/25/2023 1004   LYMPHSABS 1,204 05/04/2023 0957   MONOABS 450 05/07/2017 1020   EOSABS 287 05/04/2023 0957   BASOSABS 42 05/04/2023 0957    Pulmonary Functions Testing Results:     No data to display          Outpatient Medications Prior to Visit  Medication Sig Dispense Refill   acetaminophen (TYLENOL) 325 MG tablet Take 325-650 mg by mouth every 6 (six) hours as needed for moderate pain (pain score 4-6).     calcium carbonate (TUMS - DOSED IN MG ELEMENTAL CALCIUM) 500 MG chewable tablet Chew 1-2 tablets by mouth daily as needed for indigestion or heartburn.     hydrocortisone cream 1 % Apply 1 Application topically 2 (two) times daily as needed for itching.     methocarbamol (ROBAXIN) 500 MG tablet Take 1 tablet (500 mg total) by mouth every 6 (six) hours as needed for muscle spasms. 45 tablet 0   oxyCODONE-acetaminophen (PERCOCET/ROXICET) 5-325 MG tablet Take 1 tablet by mouth every 4 (four) hours as needed for severe pain (pain score 7-10). 30 tablet 0   No facility-administered medications prior to visit.

## 2023-08-22 LAB — ANGIOTENSIN CONVERTING ENZYME: Angiotensin-Converting Enzyme: 72 U/L (ref 14–82)

## 2023-08-30 LAB — VITAMIN D 1,25 DIHYDROXY
Vitamin D 1, 25 (OH)2 Total: 64 pg/mL
Vitamin D2 1, 25 (OH)2: 31 pg/mL
Vitamin D3 1, 25 (OH)2: 33 pg/mL

## 2023-09-06 ENCOUNTER — Ambulatory Visit: Payer: Managed Care, Other (non HMO) | Admitting: Nurse Practitioner

## 2023-09-11 ENCOUNTER — Ambulatory Visit
Admission: RE | Admit: 2023-09-11 | Discharge: 2023-09-11 | Disposition: A | Discharge status: Home / Self Care | Payer: Managed Care, Other (non HMO) | Source: Ambulatory Visit | Attending: Student in an Organized Health Care Education/Training Program | Admitting: Student in an Organized Health Care Education/Training Program

## 2023-09-11 DIAGNOSIS — I517 Cardiomegaly: Secondary | ICD-10-CM | POA: Insufficient documentation

## 2023-09-11 DIAGNOSIS — D862 Sarcoidosis of lung with sarcoidosis of lymph nodes: Secondary | ICD-10-CM | POA: Insufficient documentation

## 2023-09-11 DIAGNOSIS — D8685 Sarcoid myocarditis: Secondary | ICD-10-CM | POA: Insufficient documentation

## 2023-09-11 LAB — ECHOCARDIOGRAM COMPLETE
AR max vel: 3.27 cm2
AV Area VTI: 3.41 cm2
AV Area mean vel: 2.89 cm2
AV Mean grad: 2.5 mm[Hg]
AV Peak grad: 4.8 mm[Hg]
Ao pk vel: 1.1 m/s
Area-P 1/2: 3.06 cm2
MV VTI: 3.37 cm2
S' Lateral: 2.6 cm

## 2023-09-11 NOTE — Progress Notes (Signed)
*  PRELIMINARY RESULTS* Echocardiogram 2D Echocardiogram has been performed.  Cristela Blue 09/11/2023, 10:32 AM

## 2023-09-18 ENCOUNTER — Encounter: Payer: Self-pay | Admitting: Nurse Practitioner

## 2023-09-18 ENCOUNTER — Ambulatory Visit (INDEPENDENT_AMBULATORY_CARE_PROVIDER_SITE_OTHER): Payer: Managed Care, Other (non HMO) | Admitting: Nurse Practitioner

## 2023-09-18 VITALS — BP 136/80 | HR 69 | Temp 98.0°F | Ht 65.0 in | Wt 129.4 lb

## 2023-09-18 DIAGNOSIS — K649 Unspecified hemorrhoids: Secondary | ICD-10-CM | POA: Diagnosis not present

## 2023-09-18 DIAGNOSIS — R7309 Other abnormal glucose: Secondary | ICD-10-CM | POA: Diagnosis not present

## 2023-09-18 DIAGNOSIS — I10 Essential (primary) hypertension: Secondary | ICD-10-CM

## 2023-09-18 DIAGNOSIS — H532 Diplopia: Secondary | ICD-10-CM

## 2023-09-18 DIAGNOSIS — K219 Gastro-esophageal reflux disease without esophagitis: Secondary | ICD-10-CM

## 2023-09-18 DIAGNOSIS — E785 Hyperlipidemia, unspecified: Secondary | ICD-10-CM

## 2023-09-18 DIAGNOSIS — Z8781 Personal history of (healed) traumatic fracture: Secondary | ICD-10-CM | POA: Insufficient documentation

## 2023-09-18 DIAGNOSIS — F3341 Major depressive disorder, recurrent, in partial remission: Secondary | ICD-10-CM

## 2023-09-18 DIAGNOSIS — E538 Deficiency of other specified B group vitamins: Secondary | ICD-10-CM

## 2023-09-18 DIAGNOSIS — E559 Vitamin D deficiency, unspecified: Secondary | ICD-10-CM | POA: Diagnosis not present

## 2023-09-18 DIAGNOSIS — Z79899 Other long term (current) drug therapy: Secondary | ICD-10-CM

## 2023-09-18 DIAGNOSIS — D869 Sarcoidosis, unspecified: Secondary | ICD-10-CM | POA: Insufficient documentation

## 2023-09-18 DIAGNOSIS — E663 Overweight: Secondary | ICD-10-CM

## 2023-09-18 DIAGNOSIS — L4050 Arthropathic psoriasis, unspecified: Secondary | ICD-10-CM | POA: Diagnosis not present

## 2023-09-18 DIAGNOSIS — T7840XS Allergy, unspecified, sequela: Secondary | ICD-10-CM

## 2023-09-18 HISTORY — DX: Gastro-esophageal reflux disease without esophagitis: K21.9

## 2023-09-18 HISTORY — DX: Sarcoidosis, unspecified: D86.9

## 2023-09-18 NOTE — Progress Notes (Signed)
Follow Up  Assessment and Plan:  Diagnoses and all orders for this visit:  Essential hypertension Controlled  Continue medications;  Discussed DASH (Dietary Approaches to Stop Hypertension) DASH diet is lower in sodium than a typical American diet. Cut back on foods that are high in saturated fat, cholesterol, and trans fats. Eat more whole-grain foods, fish, poultry, and nuts Remain active and exercise as tolerated daily.  Monitor BP at home-Call if greater than 130/80.  Check CBC/CMP   Hemorrhoids, unspecified hemorrhoid type No recent flare. Controlled Continue anusol PRN Follow up with GI for worsening symptoms  Psoriatic arthritis (HCC) Continue DMARD No longer taking Stelara Follows with Dr. Dierdre Forth  She has f/u in 1 mo  Vitamin D deficiency Continue supplementation Check vitamin D level  Medication management All medications discussed and reviewed in full. All questions and concerns regarding medications addressed.     Hyperlipidemia, unspecified hyperlipidemia type Controlled Continue medications; Rosuvastatin Discussed lifestyle modifications. Recommended diet heavy in fruits and veggies, omega 3's. Decrease consumption of animal meats, cheeses, and dairy products. Remain active and exercise as tolerated. Continue to monitor. Check Lipids   Allergic state, sequela Avoid triggers. Continue OTC allergy pills  B12 deficiency Continue B12 supplement Check levels  Overweight (BMI 25.0-29.9) Discussed appropriate BMI Diet modification. Physical activity. Encouraged/praised to build confidence. Start low dose Phentermine  Other abnormal glucose Education: Reviewed 'ABCs' of diabetes management  A1C (<7) Blood pressure (<130/80) Cholesterol (LDL <70) Continue Eye Exam yearly  Continue Dental Exam Q6 mo Discussed dietary recommendations Discussed Physical Activity recommendations Check A1C  Recurrent major depressive disorder, in partial  remission (HCC) Continue Lexapro Lifestyle discussed: diet/exerise, sleep hygiene, stress management, hydration  Diplopia/esotrophia with inferior oblique overaction Significant EOM abnormalities, MRI by opth was unremarkable;  Following with [facility] neuro/ophth - Continue to follow with [facility]   GERD Continue Pepcid No suspected reflux complications (Barret/stricture). Lifestyle modification:  wt loss, avoid meals 2-3h before bedtime. Consider eliminating food triggers:  chocolate, caffeine, EtOH, acid/spicy food.   S/p left hip fracture Ortho following Continue Walker/PT  S/P ACDF C3-4, posterior laminectomy C3-4 with instrumentation and fusion C3-C5.   Well healed Dr. Yetta Barre Neurosurgery following  Orders Placed This Encounter  Procedures   Lipid panel   Hemoglobin A1c   Notify office for further evaluation and treatment, questions or concerns if any reported s/s fail to improve.   The patient was advised to call back or seek an in-person evaluation if any symptoms worsen or if the condition fails to improve as anticipated.   Further disposition pending results of labs. Discussed med's effects and SE's.    I discussed the assessment and treatment plan with the patient. The patient was provided an opportunity to ask questions and all were answered. The patient agreed with the plan and demonstrated an understanding of the instructions.  Discussed med's effects and SE's. Screening labs and tests as requested with regular follow-up as recommended.  I provided 40 minutes of face-to-face time during this encounter including counseling, chart review, and critical decision making was preformed.  Today's Plan of Care is based on a patient-centered health care approach known as shared decision making - the decisions, tests and treatments allow for patient preferences and values to be balanced with clinical evidence.     Future Appointments  Date Time Provider Department Center   12/06/2023  7:30 AM [facility] DX DEXA 1 [facility]  05/05/2024  9:00 AM Gabrielle Duncan, Archie Patten, NP [facility] None  HPI  67 y.o. female  presents for a follow up for has Hemorrhoids; Allergy; Other abnormal glucose; Hyperlipidemia; Psoriatic arthritis (HCC); Essential hypertension; Vitamin D deficiency; Medication management; Recurrent major depressive disorder, in partial remission (HCC); Cervical arthritis; Overweight (BMI 25.0-29.9); Vertical diplopia; Binocular vision disorder with diplopia; Esotropia with inferior oblique overaction; Accommodative convergent strabismus; Osteoporosis; Atherosclerosis of aortic arch (HCC); B12 deficiency; S/P cervical spinal fusion; Mediastinal lymphadenopathy; Sarcoidosis; Gastroesophageal reflux disease without esophagitis; and S/p left hip fracture on their problem list.   Overall she reports feeling well today.    She is s/p ACDF C3-4, posterior laminectomy C3-4 with instrumentation and fusion C3-C5 on 07/25/23 via Dr. Yetta Barre, Neurosurgery.  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 Yetta Barre with omplaints of neck pain, numbness of the arm(s), and loss of strength of the arm(s.  MRI of cervical spine reveal stenosis and tumor.  She has well healed with no complaints.  No difficulty swallowing and denies any restriction to BUE mobility.   She has been following Pulmonology Dr. Aundria Rud for hilar and mediastinal lymphadenopathy.  Last f/u 07/2023.  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 Surgical pathology showed giant cell type non-necrotizing granulomas in addition to bone with reactive changes in the resected lesion. While no malignancy is seen, lymphoma/leukemia is not fully ruled out and remains on the differential in addition to infection as well as sarcoidosis. She completed a video broncoscpy and with  endobronchial ultrasound on 08/2023.  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  The biopsy showed granulomatosis inflammation with flow cytometry not showing any abnormal or clonal B or T-cell population.  Diagnosis was consistent with sarcoidosis and consisted with previous pathology from the lymph node noted during her surgery.  Cultures were sent from biopsy including AFB and fungal cultures remain no growth to date.  Patient was informed that sarcoidoses has no known cause and management is conservative.  She does not have any signs of end organ involvement.  One possibility discussed was the possibility of secondary involvement from immunosuppression she had received a few months prior.    During her last OV with me 05/2023 she shared that she had  stopped all medications to which is is continuing to treat ailments with OTC medications TUMS, and Tylenol.  She is no longer taking Robaxin or Percocet. She is no longer wanting to take medications and is not receptive to restarting any medications at this time. She plans to follow up accordingly to discuss further POC.    She fell and broke her left hip over Labor Day Weekend 01/2023 [place] and getting [place] in [place] rain.  She tried to run back up to her [place].  She is now s/p left hip replacement following with orthopedics.  She has completed PT and only using a walker PRN.  Pain is controlled.     She has steady boyfriend of many years whom she lives with.  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 She is trying to retire soon. Very stressful job, however, still working, some from home as well, but plans to continue in May.  She follows with Dr. Dierdre Forth for psoriatic arthritis, on MTX.Stelara was recently stopped. Deferring retirement due to drug  costs. She also follows with Dr. Marikay Alar with [facility] Neurosurgery for her  neck/back, and Dr. Dion Saucier for her shoulder, just had L replaced Feb 18, 2020 and has done very well.   She was on wellbutrin 300 mg daily for major depression;  She is no longer taking this medication d/t diplopia. She presented in fall 2020 reporting persistent blurry vision, diplopia, had seen Dr. Dione Booze who ordered MRI which was unremarkable, concern for MG though labs were negative; she was referred to neurology, and again to neuro/opth specialist at [facility] who felt high dose wellbutrin may have contributed, discontinued this but without significant improvement. Has ongoing double vision, has dry eye and watery discharge, improving with steroids, discussing possible surgical correction for eye lid weakness, will be seeing a new provider in [place] later this year - Dr. Zettie Cooley with [facility].     BMI is Body mass index is 21.53 kg/m., she is working on diet and exercise. Wt Readings from Last 3 Encounters:  09/18/23 129 lb 6.4 oz (58.7 kg)  08/21/23 129 lb 9.6 oz (58.8 kg)  08/09/23 131 lb 6.3 oz (59.6 kg)   Her blood pressure has been controlled at home, today their BP is BP: 136/80 She does not workout. She denies chest pain, shortness of breath, dizziness.   She is on cholesterol medication rosuvastatin 5 mg daily and denies myalgias. Her cholesterol is not at goal. The cholesterol last visit was:   Lab Results  Component Value Date   CHOL 151 05/04/2023   HDL 41 (L) 05/04/2023   LDLCALC 88 05/04/2023   TRIG 122 05/04/2023   CHOLHDL 3.7 05/04/2023    Last A1C in the office was:  Lab Results  Component Value Date   HGBA1C 6.1 (H) 05/04/2023    She has hx of left thyroid nodule; underwent US and biopsy in 2014 which showed benign results. TSHs have been stable.  This year has had thinning hair, very dry skin, weight gain, fatigue; She discussed with Dr. Dierdre Forth and requests full thyroid panel, T3, T4 checked today  Lab Results  Component Value Date   TSH 1.032 07/27/2023   Last  GFR: Lab Results  Component Value Date   GFRNONAA >60 08/21/2023   Patient is on Vitamin D supplement, taking [address] 3-4 days a week  Lab Results  Component Value Date   VD25OH 46.01 08/21/2023     She has hx of low B12, not on supplement, does want levels checked today Lab Results  Component Value Date   VITAMINB12 186 (L) 05/04/2023     Current Medications:  Current Outpatient Medications on File Prior to Visit  Medication Sig Dispense Refill   acetaminophen (TYLENOL) 325 MG tablet Take 325-650 mg by mouth every 6 (six) hours as needed for moderate pain (pain score 4-6).     calcium carbonate (TUMS - DOSED IN MG ELEMENTAL CALCIUM) 500 MG chewable tablet Chew 1-2 tablets by mouth daily as needed for indigestion or heartburn.     hydrocortisone cream 1 % Apply 1 Application topically 2 (two) times daily as needed for itching.     methocarbamol (ROBAXIN) 500 MG tablet Take 1 tablet (500 mg total) by mouth every 6 (six) hours as needed for muscle spasms. 45 tablet 0   oxyCODONE-acetaminophen (PERCOCET/ROXICET) 5-325 MG tablet Take 1 tablet by mouth every 4 (four) hours as needed for severe pain (pain score 7-10). 30 tablet 0   No current facility-administered medications on file prior to visit.  Allergies:  No Known Allergies Medical History:  She has Hemorrhoids; Allergy; Other abnormal glucose; Hyperlipidemia; Psoriatic arthritis (HCC); Essential hypertension; Vitamin D deficiency; Medication management; Recurrent major depressive disorder, in partial remission (HCC); Cervical arthritis; Overweight (BMI 25.0-29.9); Vertical diplopia; Binocular vision disorder with diplopia; Esotropia with inferior oblique overaction; Accommodative convergent strabismus; Osteoporosis; Atherosclerosis of aortic arch (HCC); B12 deficiency; S/P cervical spinal fusion; Mediastinal lymphadenopathy; Sarcoidosis; Gastroesophageal reflux disease without esophagitis; and S/p left hip fracture on their problem  list. Health Maintenance:   Immunization History  Administered Date(s) Administered   Influenza, High Dose Seasonal PF 06/04/2019, 08/16/2020, 08/02/2021, 08/09/2022   Moderna Sars-Covid-2 Vaccination 11/24/2019, 12/22/2019   PPD Test 05/07/2017   Pneumococcal Conjugate-13 06/04/2019   Pneumococcal Polysaccharide-23 08/16/2020   Tdap 12/09/2015   Patient Care Team: Lucky Cowboy, MD as PCP - General (Internal Medicine) Graylin Shiver, MD as Consulting Physician (Gastroenterology) Donnetta Hail, MD as Consulting Physician (Rheumatology) Teryl Lucy, MD as Consulting Physician (Orthopedic Surgery) Marinell Blight, MD (Inactive) as Referring Physician (Neurology)  Surgical History:  She has a past surgical history that includes Cholecystectomy (1997); Shoulder surgery (03/2012); Cervical fusion (2005); Anterior cervical decomp/discectomy fusion (08/08/2012); Total shoulder arthroplasty (09/24/2012); Eye surgery; Lumbar laminectomy/decompression microdiscectomy (01/01/2013); Lumbar fusion (01/01/2013); Abdominal hysterectomy (1997); Mass excision (N/A, 01/29/2013); Hemorrhoid surgery (N/A, 09/02/2013); Reduction mammaplasty (Bilateral); Total shoulder arthroplasty (Left, 02/18/2020); Fracture surgery (Left, 2024); Anterior cervical decomp/discectomy fusion (N/A, 07/25/2023); Posterior cervical fusion/foraminotomy (N/A, 07/25/2023); Video bronchoscopy with endobronchial ultrasound (N/A, 08/09/2023); and Bronchial needle aspiration biopsy (08/09/2023). Family History:  Herfamily history includes Asthma in her daughter; Breast cancer in her cousin, cousin, and maternal aunt; CAD (age of onset: 71) in her mother; Cancer in her paternal grandfather; Dementia in her mother; Diabetes in her father; Goiter in her maternal grandmother; Heart disease in her father; Lung cancer (age of onset: 25) in her maternal grandfather; Ovarian cancer (age of onset: 79) in her paternal grandmother; Pancreatitis in  her father; Thyroid cancer in her mother; Thyroid disease in her maternal grandmother and another family member. Social History:  She reports that she has never smoked. She has never used smokeless tobacco. She reports that she does not drink alcohol and does not use drugs.   Review of Systems: Review of Systems  Constitutional:  Positive for malaise/fatigue. Negative for weight loss.  HENT:  Negative for hearing loss and tinnitus.   Eyes:  Positive for double vision (intermittent, improving). Negative for blurred vision, photophobia, pain, discharge and redness.  Respiratory:  Negative for cough, sputum production, shortness of breath and wheezing.   Cardiovascular:  Negative for chest pain, palpitations, orthopnea, claudication, leg swelling and PND.  Gastrointestinal:  Positive for heartburn. Negative for abdominal pain, blood in stool, constipation, diarrhea, melena, nausea and vomiting.  Genitourinary: Negative.   Musculoskeletal:  Positive for joint pain. Negative for falls and myalgias.  Skin:  Negative for rash.  Neurological:  Negative for dizziness, tingling, tremors, sensory change, speech change, focal weakness, seizures, loss of consciousness, weakness and headaches.  Endo/Heme/Allergies:  Negative for polydipsia.  Psychiatric/Behavioral: Negative.  Negative for depression, memory loss, substance abuse and suicidal ideas. The patient is not nervous/anxious and does not have insomnia.   All other systems reviewed and are negative.   Physical Exam: Estimated body mass index is 21.53 kg/m as calculated from the following:   Height as of this encounter: 5\' 5"  (1.651 m).   Weight as of this encounter: 129 lb 6.4 oz (58.7 kg). BP 136/80  Pulse 69   Temp 98 F (36.7 C)   Ht 5\' 5"  (1.651 m)   Wt 129 lb 6.4 oz (58.7 kg)   SpO2 99%   BMI 21.53 kg/m  General Appearance: Well nourished, in no apparent distress.  Eyes: Pupils symmetrical but with significant difficulty focusing  with erratic pupil reactions,EOM abnormal, bil interior oblique overreaction with strabismus R>L, conjunctiva no swelling or erythema  Sinuses: No Frontal/maxillary tenderness  ENT/Mouth: Ext aud canals clear, normal light reflex with TMs without erythema, bulging. Good dentition. No erythema, swelling, or exudate on post pharynx. Tonsils not swollen or erythematous. Hearing normal.  Neck: Supple, thyroid subtly enlarged, L > R, no distinct palpable nodules. No bruits  Respiratory: Respiratory effort normal, BS equal bilaterally without rales, rhonchi, wheezing or stridor.  Cardio: RRR without murmurs, rubs or gallops. Brisk peripheral pulses without edema.  Chest: symmetric, with normal excursions and percussion.  Breasts: Symmetric, without lumps, nipple discharge, retractions. Well healed reduction scars.  Abdomen: Soft, nontender, no guarding, rebound, hernias, masses, or organomegaly.  Lymphatics: Non tender without lymphadenopathy.  Genitourinary: Defer Musculoskeletal: Full ROM all peripheral extremities, 5/5 strength, and normal gait.  Skin: Well healed liner scar to posterior cervical neck approximately 6 in in length.  Well healed horizontal anterior cervical car approximately 3 in in length. Warm, dry, without lesions, ecchymosis. psoriatic rash scattered - erythematous, scaly/flaky Neuro: Cranial nerves intact excepting III, IV, VI as noted by EOMs,  reflexes equal bilaterally. Normal muscle tone, no cerebellar symptoms. Sensation intact.  Psych: Awake and oriented X 3, pleasant, normal affect, Insight and Judgment appropriate.    Gabrielle Duncan 11:31 AM [facility]

## 2023-09-18 NOTE — Patient Instructions (Signed)

## 2023-09-19 LAB — LIPID PANEL
Cholesterol: 180 mg/dL (ref <200)
HDL: 61 mg/dL (ref >=50)
LDL Cholesterol (Calc): 100 mg/dL — ABNORMAL HIGH
Non-HDL Cholesterol (Calc): 119 mg/dL (ref <130)
Total CHOL/HDL Ratio: 3 (calc) (ref <5.0)
Triglycerides: 94 mg/dL (ref <150)

## 2023-09-19 LAB — HEMOGLOBIN A1C
Hgb A1c MFr Bld: 5.5 %{Hb} (ref <5.7)
Mean Plasma Glucose: 111 mg/dL
eAG (mmol/L): 6.2 mmol/L

## 2023-09-23 LAB — ACID FAST CULTURE WITH REFLEXED SENSITIVITIES (MYCOBACTERIA)
Acid Fast Culture: NEGATIVE
Acid Fast Culture: NEGATIVE

## 2023-11-28 ENCOUNTER — Ambulatory Visit: Payer: Managed Care, Other (non HMO) | Admitting: Family Medicine

## 2023-12-06 ENCOUNTER — Other Ambulatory Visit: Payer: Managed Care, Other (non HMO)

## 2024-01-02 ENCOUNTER — Ambulatory Visit: Payer: Managed Care, Other (non HMO) | Admitting: Nurse Practitioner

## 2024-01-27 ENCOUNTER — Encounter: Payer: Self-pay | Admitting: Family Medicine

## 2024-01-27 NOTE — Progress Notes (Unsigned)
     Djon Tith T. Ladislaus Repsher, MD, CAQ Sports Medicine [facility] at [facility] [address] [place], [address]  Phone: [phone]  FAX: [phone]  Gabrielle Duncan - 68 y.o. female  MRN [id number]  Date of Birth: 06-23-1956  Date: 01/28/2024  PCP: Vangie Genet, MD  Referral: Vangie Genet, MD  No chief complaint on file.  Subjective:   Gabrielle Duncan is a 68 y.o. very pleasant female patient with There is no height or weight on file to calculate BMI. who presents with the following:  The patient presents as a new patient to me and a new patient to our medical practice.  She is a former patient of Dr. Cassondra Cliff, who recently passed away.  History is significant for sarcoidosis as well as psoriatic arthritis.  She also has hyperlipidemia, hypertension, she is not on any medication.  She has a history of depression, she is currently not on any medication for this either.    Review of Systems is noted in the HPI, as appropriate  Objective:   There were no vitals taken for this visit.  GEN: No acute distress; alert,appropriate. PULM: Breathing comfortably in no respiratory distress PSYCH: Normally interactive.   Laboratory and Imaging Data:  Assessment and Plan:   ***

## 2024-01-28 ENCOUNTER — Encounter: Payer: Self-pay | Admitting: Family Medicine

## 2024-01-28 ENCOUNTER — Ambulatory Visit (INDEPENDENT_AMBULATORY_CARE_PROVIDER_SITE_OTHER): Payer: Managed Care, Other (non HMO) | Admitting: Family Medicine

## 2024-01-28 VITALS — BP 132/70 | HR 65 | Temp 98.0°F | Ht 62.5 in | Wt 125.4 lb

## 2024-01-28 DIAGNOSIS — I10 Essential (primary) hypertension: Secondary | ICD-10-CM | POA: Diagnosis not present

## 2024-01-28 DIAGNOSIS — E782 Mixed hyperlipidemia: Secondary | ICD-10-CM

## 2024-01-28 DIAGNOSIS — F3341 Major depressive disorder, recurrent, in partial remission: Secondary | ICD-10-CM

## 2024-01-28 DIAGNOSIS — L4050 Arthropathic psoriasis, unspecified: Secondary | ICD-10-CM

## 2024-01-28 DIAGNOSIS — D869 Sarcoidosis, unspecified: Secondary | ICD-10-CM

## 2024-01-29 ENCOUNTER — Other Ambulatory Visit: Payer: Self-pay

## 2024-01-29 DIAGNOSIS — D862 Sarcoidosis of lung with sarcoidosis of lymph nodes: Secondary | ICD-10-CM

## 2024-01-31 ENCOUNTER — Encounter

## 2024-02-01 ENCOUNTER — Ambulatory Visit (INDEPENDENT_AMBULATORY_CARE_PROVIDER_SITE_OTHER): Admitting: Student in an Organized Health Care Education/Training Program

## 2024-02-01 DIAGNOSIS — D862 Sarcoidosis of lung with sarcoidosis of lymph nodes: Secondary | ICD-10-CM

## 2024-02-01 LAB — PULMONARY FUNCTION TEST
DL/VA % pred: 78 %
DL/VA: 3.32 ml/min/mmHg/L
DLCO unc % pred: 89 %
DLCO unc: 16.18 ml/min/mmHg
FEF 25-75 Post: 2.04 L/s
FEF 25-75 Pre: 1.98 L/s
FEF2575-%Change-Post: 3 %
FEF2575-%Pred-Post: 111 %
FEF2575-%Pred-Pre: 108 %
FEV1-%Change-Post: 1 %
FEV1-%Pred-Post: 124 %
FEV1-%Pred-Pre: 122 %
FEV1-Post: 2.62 L
FEV1-Pre: 2.59 L
FEV1FVC-%Change-Post: 1 %
FEV1FVC-%Pred-Pre: 97 %
FEV6-%Change-Post: 0 %
FEV6-%Pred-Post: 129 %
FEV6-%Pred-Pre: 129 %
FEV6-Post: 3.46 L
FEV6-Pre: 3.46 L
FEV6FVC-%Change-Post: 0 %
FEV6FVC-%Pred-Post: 103 %
FEV6FVC-%Pred-Pre: 103 %
FVC-%Change-Post: 0 %
FVC-%Pred-Post: 125 %
FVC-%Pred-Pre: 125 %
FVC-Post: 3.5 L
FVC-Pre: 3.49 L
Post FEV1/FVC ratio: 75 %
Post FEV6/FVC ratio: 99 %
Pre FEV1/FVC ratio: 74 %
Pre FEV6/FVC Ratio: 99 %
RV % pred: 120 %
RV: 2.49 L
TLC % pred: 122 %
TLC: 5.81 L

## 2024-02-01 NOTE — Progress Notes (Signed)
 Full PFT completed today ? ?

## 2024-02-01 NOTE — Patient Instructions (Signed)
 Full PFT completed today ? ?

## 2024-02-02 ENCOUNTER — Encounter: Payer: Self-pay | Admitting: Student in an Organized Health Care Education/Training Program

## 2024-02-12 ENCOUNTER — Encounter (HOSPITAL_COMMUNITY): Payer: Self-pay | Admitting: Neurological Surgery

## 2024-02-18 ENCOUNTER — Encounter: Payer: Self-pay | Admitting: Student in an Organized Health Care Education/Training Program

## 2024-02-18 ENCOUNTER — Ambulatory Visit (INDEPENDENT_AMBULATORY_CARE_PROVIDER_SITE_OTHER): Admitting: Student in an Organized Health Care Education/Training Program

## 2024-02-18 VITALS — BP 106/60 | HR 66 | Temp 97.6°F | Ht 62.0 in | Wt 126.8 lb

## 2024-02-18 DIAGNOSIS — Z87891 Personal history of nicotine dependence: Secondary | ICD-10-CM

## 2024-02-18 DIAGNOSIS — D862 Sarcoidosis of lung with sarcoidosis of lymph nodes: Secondary | ICD-10-CM

## 2024-02-18 NOTE — Progress Notes (Signed)
 Synopsis: Referred in sarcoidosis by Vangie Genet, MD  Assessment & Plan:   # Stage I sarcoidosis  Patient is presenting for the follow-up mediastinal and hilar lymphadenopathy status post EBUS with TBNA.  The biopsy showed granulomatosis inflammation with flow cytometry not showing any abnormal or clonal B or T-cell populations.  The CD4:CD8 ratio is markedly increased at 10:1 and is overall consistent with a diagnosis of sarcoidosis.  This is consistent with the previous pathology from the lymph node noted during her neck surgery.   She remains in her usual state of health and has no respiratory symptoms.  She comes in for follow-up after PFTs which were performed and result noted to be fully normal.  Similarly, her blood work was reassuring.   I discussed all these findings with the patient and explained that while we do not generally know what causes of sarcoidosis, management is conservative unless there is end organ involvement, which she does not have any signs of.  Other possibilities on the differential include a sarcoid like reaction secondary to the immunosuppression she has previously received.  He has been off his suppression for many months.   Furthermore, review of chest imaging does not note any involvement of her lungs by the sarcoidosis aside from the lymphadenopathy putting her at stage I.  Today, I discussed repeating her chest CT which we will obtain in 6 months when compared to the one prior.  She has had her echocardiogram which was normal as well as blood work (CBC, CMP, ACE level, 25-vitamin D , 1, 25-vitamin D , TSH previously tested normal) which returned within normal.  - CT CHEST HIGH RESOLUTION; Future   Return in about 6 months (around 08/20/2024).  I spent 30 minutes caring for this patient today, including preparing to see the patient, obtaining a medical history , reviewing a separately obtained history, performing a medically appropriate examination and/or  evaluation, counseling and educating the patient/family/caregiver, ordering medications, tests, or procedures, documenting clinical information in the electronic health record, and independently interpreting results (not separately reported/billed) and communicating results to the patient/family/caregiver  Vergia Glasgow, MD [facility]   End of visit medications:  No orders of the defined types were placed in this encounter.    Current Outpatient Medications:    acetaminophen  (TYLENOL ) 325 MG tablet, Take 325-650 mg by mouth every 6 (six) hours as needed for moderate pain (pain score 4-6)., Disp: , Rfl:    calcium  carbonate (TUMS - DOSED IN MG ELEMENTAL CALCIUM ) 500 MG chewable tablet, Chew 1-2 tablets by mouth daily as needed for indigestion or heartburn., Disp: , Rfl:    hydrocortisone  cream 1 %, Apply 1 Application topically 2 (two) times daily as needed for itching., Disp: , Rfl:    Subjective:   PATIENT ID: Gabrielle Duncan GENDER: female DOB: 05/10/1956, MRN: [id number]  Chief Complaint  Patient presents with   Follow-up    HPI  Ms. Codd is a pleasant 68 year old female presenting to clinic for follow-up.  Today, she feels well and has no respiratory symptoms.  She denies any shortness of breath, cough, chest pain, or chest tightness.  She remains very active and is able to achieve 15,000 steps a day.  She is kayaking and is able to go up flights of stairs without any limitation.  I last saw her in clinic in November 2024 at which point repeat PFTs, blood work, and an echocardiogram were ordered.  She is here to discuss results and follow-up on  her sarcoidosis.  Patient usually presented to clinic for evaluation after admission to [facility] for definitive management of cervical myelopathy via anterior and posterior approach decompressive discectomy, arthrodesis, plating, laminectomy, mass resection, and fixation.  She was noted to have mediastinal  lymphadenopathy for which she was referred to me and underwent EBUS on 08/09/2023 with cytology showing granulomatous inflammation consistent with sarcoidosis.   Patient had initially presented to neurosurgery (Dr. Rochelle Chu) with chief complaint of neck pain. Imaging noted for spinal stenosis with C3-C4 subluxation and abnormal signal within posterior elements at C3 with epidural enhancing process. She presented for ACDF. Intraoperatively she was found to have osteophyte growth that was removed in addition to disc material. There was also a fair amount of soft gelatinous mass between C3 and C4 that was resected and sent for frozen path (negative intra-op). The surgical pathology on this eventually showed large cell granulomas.   Patient reports that history of psoriatic arthritis and rheumatoid arthritis, previously on DMARDs, but currently denies taking any disease modifying agents.  She was previously on methotrexate as well as Ustekinumab, both of which discontinued.  She denies any history of smoking and denies any occupational exposures.  Ancillary information including prior medications, full medical/surgical/family/social histories, and PFTs (when available) are listed below and have been reviewed.   Review of Systems  Constitutional:  Negative for chills, fever, malaise/fatigue and weight loss.  Respiratory:  Negative for cough, hemoptysis, sputum production, shortness of breath and wheezing.   Cardiovascular:  Negative for chest pain and palpitations.     Objective:   Vitals:   02/18/24 0859  BP: 106/60  Pulse: 66  Temp: 97.6 F (36.4 C)  TempSrc: Temporal  SpO2: 100%  Weight: 126 lb 12.8 oz (57.5 kg)  Height: 5\' 2"  (1.575 m)   100% on RA  BMI Readings from Last 3 Encounters:  02/18/24 23.19 kg/m  02/01/24 22.68 kg/m  01/28/24 22.57 kg/m   Wt Readings from Last 3 Encounters:  02/18/24 126 lb 12.8 oz (57.5 kg)  02/01/24 124 lb (56.2 kg)  01/28/24 125 lb 6 oz (56.9 kg)     Physical Exam Constitutional:      Appearance: Normal appearance.  Cardiovascular:     Rate and Rhythm: Normal rate and regular rhythm.     Pulses: Normal pulses.     Heart sounds: Normal heart sounds.  Pulmonary:     Effort: Pulmonary effort is normal.     Breath sounds: Normal breath sounds.  Abdominal:     Palpations: Abdomen is soft.  Musculoskeletal:     Right lower leg: No edema.     Left lower leg: No edema.  Neurological:     Mental Status: She is alert.       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   squamous cell left eyelid     Family History  Problem Relation Age of Onset   Dementia Mother    CAD Mother 58       triple bypass   Thyroid  cancer Mother    Pancreatitis Father    Diabetes Father    Heart  disease Father    Arthritis Brother    Diabetes Brother    Thyroid  disease Maternal Grandmother    Goiter Maternal Grandmother    Lung cancer Maternal Grandfather 48       smoker   Ovarian cancer Paternal Grandmother 47   Cancer Paternal Grandfather        Unknown origin with mets   Breast cancer Cousin    Breast cancer Cousin    Breast cancer Maternal Aunt        x3   Asthma Daughter    Early death Daughter    Thyroid  disease Other        several maternal relatives     Past Surgical History:  Procedure Laterality Date   ABDOMINAL HYSTERECTOMY  1997   complete   ANTERIOR CERVICAL DECOMP/DISCECTOMY FUSION  08/08/2012   Procedure: ANTERIOR CERVICAL DECOMPRESSION/DISCECTOMY FUSION 2 LEVEL/HARDWARE REMOVAL;  Surgeon: Isadora Mar, MD;   Location: [facility];  Service: Neurosurgery;  Laterality: N/A;  anterior cervical five-six to six seven decompression fusion with removal plate four-five   ANTERIOR CERVICAL DECOMP/DISCECTOMY FUSION N/A 07/25/2023   Procedure: Anterior Cervical Decompression/Discectomy Fusion - Cervical Three-Cervical Four;  Surgeon: Joaquin Mulberry, MD;  [facility];  Service: Neurosurgery;  Laterality: N/A;   BRONCHIAL NEEDLE ASPIRATION BIOPSY  08/09/2023   Procedure: BRONCHIAL NEEDLE ASPIRATION BIOPSIES;  Surgeon: Vergia Glasgow, MD;  Location: [facility];  Service: Pulmonary;;   CERVICAL FUSION  2005   CHOLECYSTECTOMY  1997   EYE SURGERY     FRACTURE SURGERY Left 2024   femur,hip   HEMORRHOID SURGERY N/A 09/02/2013   Procedure: HEMORRHOIDECTOMY;  Surgeon: Brandy Cal. Cornett, MD;  Location: [facility];  Service: General;  Laterality: N/A;   HIP ARTHROPLASTY Left    LUMBAR FUSION  01/01/2013   L4-5   LUMBAR LAMINECTOMY/DECOMPRESSION MICRODISCECTOMY  01/01/2013   L4-5   MASS EXCISION N/A 01/29/2013   Procedure: EXCISION of chest wall mass 8 cm;  Surgeon: Lockie Rima, MD;  Location: [facility];  Service: General;  Laterality: N/A;   POSTERIOR CERVICAL FUSION/FORAMINOTOMY N/A 07/25/2023   Procedure: Cervial Laminectomy Cervical Three-Cervical Four With Lateral Mass Fusion/ Fixation;  Surgeon: Joaquin Mulberry, MD;  Location: [facility] OR;  Service: Neurosurgery;  Laterality: N/A;   REDUCTION MAMMAPLASTY Bilateral    SHOULDER ARTHROSCOPY W/ SUBACROMIAL DECOMPRESSION AND DISTAL CLAVICLE EXCISION  03/2012   debridement and partial clavical removal   TOTAL SHOULDER ARTHROPLASTY  09/24/2012   Procedure: TOTAL SHOULDER ARTHROPLASTY;  Surgeon: Neville Barbone, MD;  Location: [facility];  Service: Orthopedics;  Laterality: Right;   TOTAL SHOULDER ARTHROPLASTY Left 02/18/2020   Dr. Agatha Horsfall   VIDEO BRONCHOSCOPY WITH ENDOBRONCHIAL ULTRASOUND N/A 08/09/2023   Procedure: VIDEO  BRONCHOSCOPY WITH ENDOBRONCHIAL ULTRASOUND;  Surgeon: Vergia Glasgow, MD;  Location: [facility];  Service: Pulmonary;  Laterality: N/A;    Social History   Socioeconomic History   Marital status: Divorced    Spouse name: Not on file   Number of children: 2   Years of education: Not on file   Highest education level: Bachelor's degree (e.g., BA, AB, BS)  Occupational History   Not on file  Tobacco Use   Smoking status: Never   Smokeless tobacco: Never  Vaping Use   Vaping status: Never Used  Substance and Sexual Activity   Alcohol use: No   Drug use: No   Sexual activity: Not Currently  Other Topics Concern   Not on file  Social History Narrative   Not on file   Social Drivers of Health   Financial Resource Strain: Low Risk  (11/26/2023)   Overall Financial Resource Strain (CARDIA)    Difficulty of Paying Living Expenses: Not very hard  Food Insecurity: No Food Insecurity (11/26/2023)   Hunger Vital Sign    Worried About Running Out of Food in the Last Year: Never true    Ran Out of Food in the Last Year: Never true  Transportation Needs: No Transportation Needs (11/26/2023)   PRAPARE - Administrator, Civil Service (Medical): No    Lack of Transportation (Non-Medical): No  Physical Activity: Sufficiently Active (11/26/2023)   Exercise Vital Sign    Days of Exercise per Week: 6 days    Minutes of Exercise per Session: 60 min  Stress: Stress Concern Present (11/26/2023)   [employer] of Occupational Health - Occupational Stress Questionnaire    Feeling of Stress : To some extent  Social Connections: Moderately Integrated (11/26/2023)   Social Connection and Isolation Panel [NHANES]    Frequency of Communication with Friends and Family: More than three times a week    Frequency of Social Gatherings with Friends and Family: Three times a week    Attends Religious Services: More than 4 times per year    Active Member of Clubs or Organizations: No     Attends Banker Meetings: Not on file    Marital Status: Living with partner  Intimate Partner Violence: Not At Risk (07/28/2023)   Humiliation, Afraid, Rape, and Kick questionnaire    Fear of Current or Ex-Partner: No    Emotionally Abused: No    Physically Abused: No    Sexually Abused: No     No Known Allergies   CBC    Component Value Date/Time   WBC 6.4 08/21/2023 1008   RBC 4.41 08/21/2023 1008   HGB 12.5 08/21/2023 1008   HCT 37.6 08/21/2023 1008   PLT 325 08/21/2023 1008   MCV 85.3 08/21/2023 1008   MCH 28.3 08/21/2023 1008   MCHC 33.2 08/21/2023 1008   RDW 15.5 08/21/2023 1008   LYMPHSABS 1.5 08/21/2023 1008   MONOABS 0.4 08/21/2023 1008   EOSABS 0.2 08/21/2023 1008   BASOSABS 0.0 08/21/2023 1008    Pulmonary Functions Testing Results:    Latest Ref Rng & Units 02/01/2024    8:06 AM  PFT Results  FVC-Pre L 3.49   FVC-Predicted Pre % 125   FVC-Post L 3.50   FVC-Predicted Post % 125   Pre FEV1/FVC % % 74   Post FEV1/FCV % % 75   FEV1-Pre L 2.59   FEV1-Predicted Pre % 122   FEV1-Post L 2.62   DLCO uncorrected ml/min/mmHg 16.18   DLCO UNC% % 89   DLVA Predicted % 78   TLC L 5.81   TLC % Predicted % 122   RV % Predicted % 120     Outpatient Medications Prior to Visit  Medication Sig Dispense Refill   acetaminophen  (TYLENOL ) 325 MG tablet Take 325-650 mg by mouth every 6 (six) hours as needed for moderate pain (pain score 4-6).     calcium  carbonate (TUMS - DOSED IN MG ELEMENTAL CALCIUM ) 500 MG chewable tablet Chew 1-2 tablets by mouth daily as needed for indigestion or heartburn.     hydrocortisone  cream 1 % Apply 1 Application topically 2 (two) times daily as needed for itching.     No facility-administered medications prior to visit.

## 2024-05-05 ENCOUNTER — Encounter: Payer: Managed Care, Other (non HMO) | Admitting: Nurse Practitioner

## 2024-08-05 ENCOUNTER — Ambulatory Visit
Admission: RE | Admit: 2024-08-05 | Discharge: 2024-08-05 | Disposition: A | Discharge status: Home / Self Care | Source: Ambulatory Visit | Attending: Student in an Organized Health Care Education/Training Program | Admitting: Student in an Organized Health Care Education/Training Program

## 2024-08-05 DIAGNOSIS — D862 Sarcoidosis of lung with sarcoidosis of lymph nodes: Secondary | ICD-10-CM | POA: Insufficient documentation

## 2024-08-11 ENCOUNTER — Ambulatory Visit

## 2024-08-12 ENCOUNTER — Ambulatory Visit: Payer: Self-pay | Admitting: Student in an Organized Health Care Education/Training Program

## 2024-09-05 ENCOUNTER — Encounter: Payer: Self-pay | Admitting: Student in an Organized Health Care Education/Training Program

## 2024-09-05 ENCOUNTER — Ambulatory Visit (INDEPENDENT_AMBULATORY_CARE_PROVIDER_SITE_OTHER): Admitting: Student in an Organized Health Care Education/Training Program

## 2024-09-05 VITALS — BP 104/60 | HR 71 | Temp 98.4°F | Ht 62.0 in | Wt 112.2 lb

## 2024-09-05 DIAGNOSIS — D869 Sarcoidosis, unspecified: Secondary | ICD-10-CM | POA: Diagnosis not present

## 2024-09-05 DIAGNOSIS — R590 Localized enlarged lymph nodes: Secondary | ICD-10-CM | POA: Diagnosis not present

## 2024-09-05 DIAGNOSIS — Z23 Encounter for immunization: Secondary | ICD-10-CM

## 2024-09-05 NOTE — Patient Instructions (Signed)
  VISIT SUMMARY: Today, you came in for a follow-up appointment regarding your stage one sarcoidosis. We discussed your current symptoms, recent health changes, and reviewed your overall health status. You also received an influenza vaccination due to your exposure risk.  YOUR PLAN: -STAGE I SARCOIDOSIS (MEDIASTINAL LYMPHADENOPATHY ONLY): Stage I sarcoidosis is a condition where there is inflammation in the lymph nodes in the chest without affecting the lungs. Your recent CT scan showed no lung involvement, and since there is no progression or symptoms, no treatment is required at this time. We will continue with annual follow-ups unless you develop new symptoms.  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 You received the influenza vaccine today to help reduce your risk of getting the flu.  INSTRUCTIONS: Please continue with your annual follow-ups for sarcoidosis unless you develop new symptoms. If you experience any new or worsening symptoms, please contact our office. Additionally, continue monitoring your blood pressure and maintain your active lifestyle. If you have any concerns or questions, do not hesitate to reach out.     Contains text generated by [employer].

## 2024-09-05 NOTE — Progress Notes (Signed)
 Assessment & Plan:   Assessment & Plan  #Stage I sarcoidosis (mediastinal lymphadenopathy only)  Stage I sarcoidosis with mediastinal lymphadenopathy only. EBUS/TBNA 08/2023 with granulomatous inflammation and elevated CD4:CD8 ratio of 10:1, consistent with sarcoidosis. No pulmonary involvement on prior imaging nor on most recent high resolution CT scan. No treatment required as there is no progression or symptoms and she remains in her usual state of health.  Discussed these findings, and given stable CT, as well as normal echocardiogram and blood work (CBC, CMP, ACE level, 25-Vitamin D , 1,25-Vitamin D , and TSH) no need for further investigation at this point. Will continue to monitor with yearly follow up.  - Continue annual follow-up unless symptoms develop.  #Influenza vaccination  Recommended due to exposure risk while caring for her mother and recent illness outbreaks at work.  - Administered influenza vaccine today.  Return in about 1 year (around 09/05/2025).  Belva November, MD [facility]  I spent 32 minutes caring for this patient today, including preparing to see the patient, obtaining a medical history , reviewing a separately obtained history, performing a medically appropriate examination and/or evaluation, counseling and educating the patient/family/caregiver, ordering medications, tests, or procedures, documenting clinical information in the electronic health record, and independently interpreting results (not separately reported/billed) and communicating results to the patient/family/caregiver  End of visit medications:  No orders of the defined types were placed in this encounter.    Current Outpatient Medications:    acetaminophen  (TYLENOL ) 650 MG CR tablet, Take 650 mg by mouth every 8 (eight) hours as needed for pain., Disp: , Rfl:    calcium  carbonate (TUMS - DOSED IN MG ELEMENTAL CALCIUM ) 500 MG chewable tablet, Chew 1-2 tablets by mouth  daily as needed for indigestion or heartburn., Disp: , Rfl:    hydrocortisone  cream 1 %, Apply 1 Application topically 2 (two) times daily as needed for itching., Disp: , Rfl:    Subjective:   PATIENT ID: Gabrielle Duncan GENDER: female DOB: 1956/05/17, MRN: [id number]  Chief Complaint  Patient presents with   Sarcoidosis    SOB with hills. No wheezing or cough.     HPI  Discussed the use of AI scribe software for clinical note transcription with the patient, who gave verbal consent to proceed.  History of Present Illness  Gabrielle Duncan is a 68 year old female with stage one sarcoidosis who presents for follow-up.   Patient initially presented to clinic for evaluation after admission to [facility] for definitive management of cervical myelopathy via anterior and posterior approach decompressive discectomy, arthrodesis, plating, laminectomy, mass resection, and fixation.  She was noted to have mediastinal lymphadenopathy for which she was referred to me and underwent EBUS on 08/09/2023 with cytology showing granulomatous inflammation consistent with sarcoidosis.   Patient had presented to neurosurgery (Dr. Joshua) with chief complaint of neck pain. Imaging noted for spinal stenosis with C3-C4 subluxation and abnormal signal within posterior elements at C3 with epidural enhancing process. She presented for ACDF. Intraoperatively she was found to have osteophyte growth that was removed in addition to disc material. There was also a fair amount of soft gelatinous mass between C3 and C4 that was resected and sent for frozen path (negative intra-op). The surgical pathology on this eventually showed large cell granulomas.   Patient reports that history of psoriatic arthritis and rheumatoid arthritis, previously on DMARDs, but currently denies taking any disease modifying agents.  She was previously on methotrexate as well as Ustekinumab,  both of which discontinued.  Return Visit  02/18/2024:  Today, she feels well and has no respiratory symptoms.  She denies any shortness of breath, cough, chest pain, or chest tightness.  She remains very active and is able to achieve 15,000 steps a day.  She is kayaking and is able to go up flights of stairs without any limitation.  I last saw her in clinic in November 2024 at which point repeat PFTs, blood work, and an echocardiogram were ordered.  She is here to discuss results and follow-up on her sarcoidosis.  Return Visit 09/05/2024:  She was last seen in May 2025 and has been followed over the past year for stage one sarcoidosis. An EBUS in November 2024 for mediastinal lymphadenopathy showed granulomatous inflammation consistent with sarcoidosis. She reports occasional sore throats and difficulty breathing when exposed to illnesses at work.  She has a history of C3, C4 subluxation treated with ACDF, during which osteophyte growth and disc material were removed, revealing granulomas. She describes a hard, elongated, and sometimes painful area on her sternum, which she noticed a couple of months ago.  She has a history of psoriatic arthritis and rheumatoid arthritis, previously treated with methotrexate and ustekinumab, both discontinued over a year ago. She reports no current medication use for arthritis and manages symptoms with Tylenol  and Advil.  She is active, walking 15,000 to 25,000 steps a day, and climbing 15 to 20 flights of stairs daily. She has lost 12 to 13 pounds, which she attributes to her active lifestyle and caring for her 74 year old mother. She does not smoke or drink and has reduced her coffee intake. She monitors her blood pressure, which she reports as low.   Ancillary information including prior medications, full medical/surgical/family/social histories, and PFTs (when available) are listed below and have been reviewed.    Review of Systems  Constitutional:  Negative for chills and fever.  Respiratory:   Negative for cough, hemoptysis, sputum production, shortness of breath and wheezing.   Cardiovascular:  Negative for chest pain and palpitations.     Objective:   Vitals:   09/05/24 0909  BP: 104/60  Pulse: 71  Temp: 98.4 F (36.9 C)  SpO2: 100%  Weight: 112 lb 3.2 oz (50.9 kg)  Height: 5' 2 (1.575 m)   100% on RA  BMI Readings from Last 3 Encounters:  09/05/24 20.52 kg/m  02/18/24 23.19 kg/m  02/01/24 22.68 kg/m   Wt Readings from Last 3 Encounters:  09/05/24 112 lb 3.2 oz (50.9 kg)  02/18/24 126 lb 12.8 oz (57.5 kg)  02/01/24 124 lb (56.2 kg)    Physical Exam Constitutional:      Appearance: Normal appearance.  Cardiovascular:     Rate and Rhythm: Normal rate and regular rhythm.     Pulses: Normal pulses.     Heart sounds: Normal heart sounds.  Pulmonary:     Effort: Pulmonary effort is normal. No respiratory distress.     Breath sounds: Normal breath sounds. No wheezing or rales.  Musculoskeletal:     Right lower leg: No edema.     Left lower leg: No edema.  Neurological:     Mental Status: She is alert.       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   squamous cell left eyelid     Family History  Problem Relation Age of Onset   Dementia Mother    CAD Mother 71       triple bypass   Thyroid  cancer Mother    Pancreatitis Father    Diabetes Father    Heart disease Father    Arthritis Brother    Diabetes Brother    Thyroid  disease Maternal Grandmother    Goiter Maternal Grandmother    Lung cancer Maternal Grandfather 48       smoker    Ovarian cancer Paternal Grandmother 43   Cancer Paternal Grandfather        Unknown origin with mets   Breast cancer Cousin    Breast cancer Cousin    Breast cancer Maternal Aunt        x3   Asthma Daughter    Early death Daughter    Thyroid  disease Other        several maternal relatives     Past Surgical History:  Procedure Laterality Date   ABDOMINAL HYSTERECTOMY  1997   complete   ANTERIOR CERVICAL DECOMP/DISCECTOMY FUSION  08/08/2012   Procedure: ANTERIOR CERVICAL DECOMPRESSION/DISCECTOMY FUSION 2 LEVEL/HARDWARE REMOVAL;  Surgeon: Alm GORMAN Molt, MD;  Location: [facility];  Service: Neurosurgery;  Laterality: N/A;  anterior cervical five-six to six seven decompression fusion with removal plate four-five   ANTERIOR CERVICAL DECOMP/DISCECTOMY FUSION N/A 07/25/2023   Procedure: Anterior Cervical Decompression/Discectomy Fusion - Cervical Three-Cervical Four;  Surgeon: Molt Alm Hamilton, MD;  Location[facility];  Service: Neurosurgery;  Laterality: N/A;   BRONCHIAL NEEDLE ASPIRATION BIOPSY  08/09/2023   Procedure: BRONCHIAL NEEDLE ASPIRATION BIOPSIES;  Surgeon: Isadora Hose, MD;  Location: [facility];  Service: Pulmonary;;   CERVICAL FUSION  2005   CHOLECYSTECTOMY  1997   EYE SURGERY     FRACTURE SURGERY Left 2024   femur,hip   HEMORRHOID SURGERY N/A 09/02/2013   Procedure: HEMORRHOIDECTOMY;  Surgeon: Debby LABOR. Cornett, MD;  Location: [facility];  Service: General;  Laterality: N/A;   HIP ARTHROPLASTY Left    LUMBAR FUSION  01/01/2013   L4-5   LUMBAR LAMINECTOMY/DECOMPRESSION MICRODISCECTOMY  01/01/2013   L4-5   MASS EXCISION N/A 01/29/2013   Procedure: EXCISION of chest wall mass 8 cm;  Surgeon: Jina Nephew, MD;  Location:  SURGERY CENTER;  Service: General;  Laterality: N/A;   POSTERIOR CERVICAL FUSION/FORAMINOTOMY N/A 07/25/2023   Procedure: Cervial Laminectomy Cervical Three-Cervical Four With Lateral Mass Fusion/ Fixation;  Surgeon: Molt Alm Hamilton, MD;  Location[facility];  Service: Neurosurgery;  Laterality: N/A;   REDUCTION MAMMAPLASTY Bilateral    SHOULDER ARTHROSCOPY W/ SUBACROMIAL DECOMPRESSION AND DISTAL CLAVICLE EXCISION  03/2012   debridement and partial clavical removal   TOTAL SHOULDER ARTHROPLASTY  09/24/2012   Procedure: TOTAL SHOULDER ARTHROPLASTY;  Surgeon: Fonda SHAUNNA Olmsted, MD;  Location: [facility];  Service: Orthopedics;  Laterality: Right;   TOTAL SHOULDER ARTHROPLASTY Left 02/18/2020   Dr. Olmsted   VIDEO BRONCHOSCOPY WITH ENDOBRONCHIAL ULTRASOUND N/A 08/09/2023   Procedure: VIDEO BRONCHOSCOPY WITH ENDOBRONCHIAL ULTRASOUND;  Surgeon: Isadora Hose, MD;  Location: [facility];  Service: Pulmonary;  Laterality: N/A;    Social History   Socioeconomic History   Marital status: Divorced    Spouse name: Not on file   Number of children: 2  Years of education: Not on file   Highest education level: Bachelor's degree (e.g., BA, AB, BS)  Occupational History   Not on file  Tobacco Use   Smoking status: Never   Smokeless tobacco: Never  Vaping Use   Vaping status: Never Used  Substance and Sexual Activity   Alcohol use: No   Drug use: No   Sexual activity: Not Currently  Other Topics Concern   Not on file  Social History Narrative   Not on file   Social Drivers of Health   Financial Resource Strain: Low Risk  (11/26/2023)   Overall Financial Resource Strain (CARDIA)    Difficulty of Paying Living Expenses: Not very hard  Food Insecurity: No Food Insecurity (11/26/2023)   Hunger Vital Sign    Worried About Running Out of Food in the Last Year: Never true    Ran Out of Food in the Last Year: Never true  Transportation Needs: No Transportation Needs (11/26/2023)   PRAPARE - Administrator, Civil Service (Medical): No    Lack of Transportation (Non-Medical): No  Physical Activity: Sufficiently Active (11/26/2023)   Exercise Vital Sign    Days of Exercise per Week: 6 days    Minutes of  Exercise per Session: 60 min  Stress: Stress Concern Present (11/26/2023)   [employer] of Occupational Health - Occupational Stress Questionnaire    Feeling of Stress : To some extent  Social Connections: Moderately Integrated (11/26/2023)   Social Connection and Isolation Panel    Frequency of Communication with Friends and Family: More than three times a week    Frequency of Social Gatherings with Friends and Family: Three times a week    Attends Religious Services: More than 4 times per year    Active Member of Clubs or Organizations: No    Attends Banker Meetings: Not on file    Marital Status: Living with partner  Intimate Partner Violence: Not At Risk (07/28/2023)   Humiliation, Afraid, Rape, and Kick questionnaire    Fear of Current or Ex-Partner: No    Emotionally Abused: No    Physically Abused: No    Sexually Abused: No     No Known Allergies   CBC    Component Value Date/Time   WBC 6.4 08/21/2023 1008   RBC 4.41 08/21/2023 1008   HGB 12.5 08/21/2023 1008   HCT 37.6 08/21/2023 1008   PLT 325 08/21/2023 1008   MCV 85.3 08/21/2023 1008   MCH 28.3 08/21/2023 1008   MCHC 33.2 08/21/2023 1008   RDW 15.5 08/21/2023 1008   LYMPHSABS 1.5 08/21/2023 1008   MONOABS 0.4 08/21/2023 1008   EOSABS 0.2 08/21/2023 1008   BASOSABS 0.0 08/21/2023 1008    Pulmonary Functions Testing Results:    Latest Ref Rng & Units 02/01/2024    8:06 AM  PFT Results  FVC-Pre L 3.49   FVC-Predicted Pre % 125   FVC-Post L 3.50   FVC-Predicted Post % 125   Pre FEV1/FVC % % 74   Post FEV1/FCV % % 75   FEV1-Pre L 2.59   FEV1-Predicted Pre % 122   FEV1-Post L 2.62   DLCO uncorrected ml/min/mmHg 16.18   DLCO UNC% % 89   DLVA Predicted % 78   TLC L 5.81   TLC % Predicted % 122   RV % Predicted % 120     Outpatient Medications Prior to Visit  Medication Sig Dispense Refill   acetaminophen  (TYLENOL ) 650 MG  CR tablet Take 650 mg by mouth every 8 (eight) hours as needed  for pain.     calcium  carbonate (TUMS - DOSED IN MG ELEMENTAL CALCIUM ) 500 MG chewable tablet Chew 1-2 tablets by mouth daily as needed for indigestion or heartburn.     hydrocortisone  cream 1 % Apply 1 Application topically 2 (two) times daily as needed for itching.     acetaminophen  (TYLENOL ) 325 MG tablet Take 325-650 mg by mouth every 6 (six) hours as needed for moderate pain (pain score 4-6).     No facility-administered medications prior to visit.

## 2025-01-21 ENCOUNTER — Other Ambulatory Visit

## 2025-01-28 ENCOUNTER — Encounter: Admitting: Family Medicine
